# Patient Record
Sex: Male | Born: 1970 | Race: White | Hispanic: No | Smoking: Current every day smoker
Health system: Southern US, Community
[De-identification: ages and names within clinical notes are randomized; demographics above are authoritative.]

## PROBLEM LIST (undated history)

## (undated) DIAGNOSIS — F319 Bipolar disorder, unspecified: Secondary | ICD-10-CM

## (undated) DIAGNOSIS — I2510 Atherosclerotic heart disease of native coronary artery without angina pectoris: Secondary | ICD-10-CM

## (undated) DIAGNOSIS — Z951 Presence of aortocoronary bypass graft: Secondary | ICD-10-CM

## (undated) DIAGNOSIS — Z720 Tobacco use: Secondary | ICD-10-CM

## (undated) DIAGNOSIS — I10 Essential (primary) hypertension: Secondary | ICD-10-CM

## (undated) DIAGNOSIS — I219 Acute myocardial infarction, unspecified: Secondary | ICD-10-CM

## (undated) DIAGNOSIS — F1010 Alcohol abuse, uncomplicated: Secondary | ICD-10-CM

## (undated) HISTORY — PX: CORONARY ARTERY BYPASS GRAFT: SHX141

## (undated) HISTORY — PX: CARDIAC SURGERY: SHX584

---

## 2004-11-10 ENCOUNTER — Other Ambulatory Visit: Payer: Self-pay

## 2004-11-10 ENCOUNTER — Inpatient Hospital Stay: Payer: Self-pay | Admitting: Internal Medicine

## 2004-11-11 ENCOUNTER — Other Ambulatory Visit: Payer: Self-pay

## 2008-05-23 ENCOUNTER — Emergency Department: Payer: Self-pay | Admitting: Emergency Medicine

## 2013-05-24 ENCOUNTER — Emergency Department: Payer: Self-pay | Admitting: Emergency Medicine

## 2014-01-07 ENCOUNTER — Emergency Department: Payer: Self-pay | Admitting: Emergency Medicine

## 2014-01-07 LAB — COMPREHENSIVE METABOLIC PANEL
ALBUMIN: 4.6 g/dL (ref 3.4–5.0)
Alkaline Phosphatase: 88 U/L
Anion Gap: 6 — ABNORMAL LOW (ref 7–16)
BILIRUBIN TOTAL: 0.2 mg/dL (ref 0.2–1.0)
BUN: 5 mg/dL — AB (ref 7–18)
CO2: 28 mmol/L (ref 21–32)
Calcium, Total: 9 mg/dL (ref 8.5–10.1)
Chloride: 107 mmol/L (ref 98–107)
Creatinine: 0.56 mg/dL — ABNORMAL LOW (ref 0.60–1.30)
EGFR (African American): >60
EGFR (Non-African Amer.): >60
Glucose: 95 mg/dL (ref 65–99)
OSMOLALITY: 278 (ref 275–301)
Potassium: 3.8 mmol/L (ref 3.5–5.1)
SGOT(AST): 110 U/L — ABNORMAL HIGH (ref 15–37)
SGPT (ALT): 75 U/L (ref 12–78)
SODIUM: 141 mmol/L (ref 136–145)
Total Protein: 8.5 g/dL — ABNORMAL HIGH (ref 6.4–8.2)

## 2014-01-07 LAB — CBC
HCT: 46.3 % (ref 40.0–52.0)
HGB: 15.8 g/dL (ref 13.0–18.0)
MCH: 34 pg (ref 26.0–34.0)
MCHC: 34.1 g/dL (ref 32.0–36.0)
MCV: 100 fL (ref 80–100)
Platelet: 159 10*3/uL (ref 150–440)
RBC: 4.65 10*6/uL (ref 4.40–5.90)
RDW: 13.5 % (ref 11.5–14.5)
WBC: 7.5 10*3/uL (ref 3.8–10.6)

## 2014-01-07 LAB — ETHANOL
ETHANOL %: 0.382 % — AB (ref 0.000–0.080)
Ethanol: 382 mg/dL

## 2014-01-08 ENCOUNTER — Emergency Department: Payer: Self-pay | Admitting: Emergency Medicine

## 2014-01-08 LAB — COMPREHENSIVE METABOLIC PANEL
AST: 107 U/L — AB (ref 15–37)
Albumin: 4.4 g/dL (ref 3.4–5.0)
Alkaline Phosphatase: 89 U/L
Anion Gap: 9 (ref 7–16)
BILIRUBIN TOTAL: 0.5 mg/dL (ref 0.2–1.0)
BUN: 6 mg/dL — ABNORMAL LOW (ref 7–18)
CALCIUM: 9.3 mg/dL (ref 8.5–10.1)
Chloride: 102 mmol/L (ref 98–107)
Co2: 28 mmol/L (ref 21–32)
Creatinine: 0.76 mg/dL (ref 0.60–1.30)
EGFR (African American): >60
EGFR (Non-African Amer.): >60
GLUCOSE: 141 mg/dL — AB (ref 65–99)
OSMOLALITY: 278 (ref 275–301)
POTASSIUM: 3.5 mmol/L (ref 3.5–5.1)
SGPT (ALT): 75 U/L (ref 12–78)
Sodium: 139 mmol/L (ref 136–145)
Total Protein: 8.6 g/dL — ABNORMAL HIGH (ref 6.4–8.2)

## 2014-01-08 LAB — DRUG SCREEN, URINE

## 2014-01-08 LAB — URINALYSIS, COMPLETE
BACTERIA: NONE SEEN
Bilirubin,UR: NEGATIVE
Blood: NEGATIVE
Glucose,UR: 50 mg/dL (ref 0–75)
Ketone: NEGATIVE
LEUKOCYTE ESTERASE: NEGATIVE
NITRITE: NEGATIVE
Ph: 6 (ref 4.5–8.0)
Protein: NEGATIVE
RBC,UR: NONE SEEN /HPF (ref 0–5)
SPECIFIC GRAVITY: 1.003 (ref 1.003–1.030)
SQUAMOUS EPITHELIAL: NONE SEEN

## 2014-01-08 LAB — SALICYLATE LEVEL: SALICYLATES, SERUM: 4.1 mg/dL — AB

## 2014-01-08 LAB — CBC
HCT: 45.1 % (ref 40.0–52.0)
HGB: 15.5 g/dL (ref 13.0–18.0)
MCH: 34.6 pg — ABNORMAL HIGH (ref 26.0–34.0)
MCHC: 34.4 g/dL (ref 32.0–36.0)
MCV: 101 fL — ABNORMAL HIGH (ref 80–100)
Platelet: 148 10*3/uL — ABNORMAL LOW (ref 150–440)
RBC: 4.49 10*6/uL (ref 4.40–5.90)
RDW: 13.7 % (ref 11.5–14.5)
WBC: 6.5 10*3/uL (ref 3.8–10.6)

## 2014-01-08 LAB — ETHANOL
ETHANOL LVL: 324 mg/dL — AB
Ethanol %: 0.324 % (ref 0.000–0.080)

## 2014-01-08 LAB — ACETAMINOPHEN LEVEL

## 2015-03-25 ENCOUNTER — Encounter: Payer: Self-pay | Admitting: Emergency Medicine

## 2015-03-25 ENCOUNTER — Emergency Department
Admission: EM | Admit: 2015-03-25 | Discharge: 2015-03-25 | Disposition: A | Discharge status: Home / Self Care | Payer: Self-pay | Attending: Student | Admitting: Student

## 2015-03-25 DIAGNOSIS — Y9289 Other specified places as the place of occurrence of the external cause: Secondary | ICD-10-CM | POA: Insufficient documentation

## 2015-03-25 DIAGNOSIS — Y9389 Activity, other specified: Secondary | ICD-10-CM | POA: Insufficient documentation

## 2015-03-25 DIAGNOSIS — R509 Fever, unspecified: Secondary | ICD-10-CM | POA: Insufficient documentation

## 2015-03-25 DIAGNOSIS — Z720 Tobacco use: Secondary | ICD-10-CM | POA: Insufficient documentation

## 2015-03-25 DIAGNOSIS — I10 Essential (primary) hypertension: Secondary | ICD-10-CM | POA: Insufficient documentation

## 2015-03-25 DIAGNOSIS — W57XXXA Bitten or stung by nonvenomous insect and other nonvenomous arthropods, initial encounter: Secondary | ICD-10-CM | POA: Insufficient documentation

## 2015-03-25 DIAGNOSIS — T148 Other injury of unspecified body region: Secondary | ICD-10-CM | POA: Insufficient documentation

## 2015-03-25 DIAGNOSIS — Y998 Other external cause status: Secondary | ICD-10-CM | POA: Insufficient documentation

## 2015-03-25 HISTORY — DX: Presence of aortocoronary bypass graft: Z95.1

## 2015-03-25 HISTORY — DX: Essential (primary) hypertension: I10

## 2015-03-25 MED ORDER — DOXYCYCLINE HYCLATE 100 MG PO TABS
100.0000 mg | ORAL_TABLET | Freq: Once | ORAL | Status: DC
  Administered 2015-03-25: 100 mg via ORAL
  Filled 2015-03-25: qty 1

## 2015-03-25 MED ORDER — DOXYCYCLINE HYCLATE 100 MG PO TABS: 100.0000 mg | ORAL_TABLET | Freq: Two times a day (BID) | ORAL | Status: DC

## 2015-03-25 NOTE — Discharge Instructions (Signed)
Tick Bite Information Ticks are insects that attach themselves to the skin and draw blood for food. There are various types of ticks. Common types include wood ticks and deer ticks. Most ticks live in shrubs and grassy areas. Ticks can climb onto your body when you make contact with leaves or grass where the tick is waiting. The most common places on the body for ticks to attach themselves are the scalp, neck, armpits, waist, and groin. Most tick bites are harmless, but sometimes ticks carry germs that cause diseases. These germs can be spread to a person during the tick's feeding process. The chance of a disease spreading through a tick bite depends on:   The type of tick.  Time of year.   How long the tick is attached.   Geographic location.  HOW CAN YOU PREVENT TICK BITES? Take these steps to help prevent tick bites when you are outdoors:  Wear protective clothing. Long sleeves and long pants are best.   Wear white clothes so you can see ticks more easily.  Tuck your pant legs into your socks.   If walking on a trail, stay in the middle of the trail to avoid brushing against bushes.  Avoid walking through areas with long grass.  Put insect repellent on all exposed skin and along boot tops, pant legs, and sleeve cuffs.   Check clothing, hair, and skin repeatedly and before going inside.   Brush off any ticks that are not attached.  Take a shower or bath as soon as possible after being outdoors.  WHAT IS THE PROPER WAY TO REMOVE A TICK? Ticks should be removed as soon as possible to help prevent diseases caused by tick bites. 1. If latex gloves are available, put them on before trying to remove a tick.  2. Using fine-point tweezers, grasp the tick as close to the skin as possible. You may also use curved forceps or a tick removal tool. Grasp the tick as close to its head as possible. Avoid grasping the tick on its body. 3. Pull gently with steady upward pressure until  the tick lets go. Do not twist the tick or jerk it suddenly. This may break off the tick's head or mouth parts. 4. Do not squeeze or crush the tick's body. This could force disease-carrying fluids from the tick into your body.  5. After the tick is removed, wash the bite area and your hands with soap and water or other disinfectant such as alcohol. 6. Apply a small amount of antiseptic cream or ointment to the bite site.  7. Wash and disinfect any instruments that were used.  Do not try to remove a tick by applying a hot match, petroleum jelly, or fingernail polish to the tick. These methods do not work and may increase the chances of disease being spread from the tick bite.  WHEN SHOULD YOU SEEK MEDICAL CARE? Contact your health care provider if you are unable to remove a tick from your skin or if a part of the tick breaks off and is stuck in the skin.  After a tick bite, you need to be aware of signs and symptoms that could be related to diseases spread by ticks. Contact your health care provider if you develop any of the following in the days or weeks after the tick bite:  Unexplained fever.  Rash. A circular rash that appears days or weeks after the tick bite may indicate the possibility of Lyme disease. The rash may resemble   a target with a bull's-eye and may occur at a different part of your body than the tick bite.  Redness and swelling in the area of the tick bite.   Tender, swollen lymph glands.   Diarrhea.   Weight loss.   Cough.   Fatigue.   Muscle, joint, or bone pain.   Abdominal pain.   Headache.   Lethargy or a change in your level of consciousness.  Difficulty walking or moving your legs.   Numbness in the legs.   Paralysis.  Shortness of breath.   Confusion.   Repeated vomiting.  Document Released: 11/03/2000 Document Revised: 08/27/2013 Document Reviewed: 04/16/2013 [employer] Patient Information 2015 [employer]. This information is  not intended to replace advice given to you by your health care provider. Make sure you discuss any questions you have with your health care provider.  

## 2015-03-25 NOTE — ED Provider Notes (Signed)
[facility] Emergency Department Provider Note ____________________________________________  Time seen: ----------------------------------------- 3:27 PM on 03/25/2015 -----------------------------------------    I have reviewed the triage vital signs and the nursing notes.   HISTORY  Chief Complaint Tick bite/fever   HPI Jason Atkins is a 44 y.o. male presents with one week hx of tick bites. Initially just had itching at sites but now is feeling feverish, with myalgias.  No cough, congestion, sore throat, abd pain, nausea, diarrhea.  No ring like rash at the sites.  Past Medical History  Diagnosis Date  . Hx of CABG   . Hypertension     There are no active problems to display for this patient.   No past surgical history on file.  Current Outpatient Rx  Name  Route  Sig  Dispense  Refill  . doxycycline (VIBRA-TABS) 100 MG tablet   Oral   Take 1 tablet (100 mg total) by mouth 2 (two) times daily.   20 tablet   1     Allergies Review of patient's allergies indicates no known allergies.  History reviewed. No pertinent family history.  Social History History  Substance Use Topics  . Smoking status: Current Every Day Smoker -- 0.50 packs/day  . Smokeless tobacco: Not on file  . Alcohol Use: Yes    Review of Systems  Constitutional:  fever. Eyes: Negative for visual changes. ENT: Negative for sore throat. Cardiovascular: Negative for chest pain. Respiratory: Negative for shortness of breath. Gastrointestinal: Negative for abdominal pain, vomiting and diarrhea. Genitourinary: Negative for dysuria. Musculoskeletal: Negative for back pain. Skin: Negative for rash. Neurological: Negative for headaches, focal weakness or numbness.   10-point ROS otherwise negative.  ____________________________________________   PHYSICAL EXAM:  VITAL SIGNS: ED Triage Vitals  Enc Vitals Group     BP 03/25/15 1446 147/109 mmHg     Pulse Rate  03/25/15 1446 98     Resp 03/25/15 1446 18     Temp 03/25/15 1446 97.4 F (36.3 C)     Temp Source 03/25/15 1446 Oral     SpO2 03/25/15 1446 97 %     Weight 03/25/15 1446 178 lb (80.74 kg)     Height 03/25/15 1446 5\' 11"  (1.803 m)     Head Cir --      Peak Flow --      Pain Score 03/25/15 1447 4     Pain Loc --      Pain Edu? --      Excl. in GC? --     Constitutional: Alert and oriented. Well appearing and in no distress. Eyes: Conjunctivae are normal. PERRL. Normal extraocular movements. ENT   Head: Normocephalic and atraumatic.   Nose: No congestion/rhinnorhea.   Mouth/Throat: Mucous membranes are moist.   Neck: No stridor. Hematological/Lymphatic/Immunilogical: No cervical lymphadenopathy. Cardiovascular: Normal rate, regular rhythm. Normal and symmetric distal pulses are present in all extremities. No murmurs, rubs, or gallops. Respiratory: Normal respiratory effort without tachypnea nor retractions. Breath sounds are clear and equal bilaterally. No wheezes/rales/rhonchi. Gastrointestinal: Soft and nontender. No distention. No abdominal bruits. There is no CVA tenderness. Genitourinary:  Musculoskeletal: Nontender with normal range of motion in all extremities. No joint effusions.  No lower extremity tenderness nor edema. Neurologic:  Normal speech and language. No gross focal neurologic deficits are appreciated. Speech is normal. No gait instability. Skin:  Skin is warm, dry and intact. Erythematous area on right and left hips/waist line with central ecchymotic area 2-3 mm.  No remaining tick  identified. Psychiatric: Mood and affect are normal. Speech and behavior are normal. Patient exhibits appropriate insight and judgment.  ____________________________________________    LABS (pertinent positives/negatives)    ____________________________________________   EKG    ____________________________________________     RADIOLOGY    ____________________________________________   PROCEDURES  Procedure(s) performed: None  Critical Care performed: No  ____________________________________________   INITIAL IMPRESSION / ASSESSMENT AND PLAN / ED COURSE  Tick bite  Fever, unspecified fever cause  Given doxycycline bid x 10 days.  Follow up as needed for persistent symptoms.  Pertinent labs & imaging results that were available during my care of the patient were reviewed by me and considered in my medical decision making (see chart for details).  ____________________________________________   FINAL CLINICAL IMPRESSION(S) / ED DIAGNOSES  Final diagnoses:  Tick bite  Fever, unspecified fever cause     Jason BayleyRobert Sacha Radloff, PA-C 03/25/15 1613  Jason Bayleyobert Deyonte Cadden, PA-C 03/25/15 1755  Gayla DossEryka A Gayle, MD 03/26/15 [id number]

## 2015-03-25 NOTE — ED Notes (Signed)
Pt with multiple tick bites to lower abd about one week ago and now having nausea and states he feels lethargic.

## 2015-10-26 ENCOUNTER — Encounter: Payer: Self-pay | Admitting: Emergency Medicine

## 2015-10-26 ENCOUNTER — Emergency Department

## 2015-10-26 ENCOUNTER — Emergency Department
Admission: EM | Admit: 2015-10-26 | Discharge: 2015-10-26 | Disposition: A | Discharge status: Home / Self Care | Attending: Emergency Medicine | Admitting: Emergency Medicine

## 2015-10-26 DIAGNOSIS — I10 Essential (primary) hypertension: Secondary | ICD-10-CM | POA: Insufficient documentation

## 2015-10-26 DIAGNOSIS — F17210 Nicotine dependence, cigarettes, uncomplicated: Secondary | ICD-10-CM | POA: Diagnosis not present

## 2015-10-26 DIAGNOSIS — Y9289 Other specified places as the place of occurrence of the external cause: Secondary | ICD-10-CM | POA: Diagnosis not present

## 2015-10-26 DIAGNOSIS — Z79899 Other long term (current) drug therapy: Secondary | ICD-10-CM | POA: Insufficient documentation

## 2015-10-26 DIAGNOSIS — S90512A Abrasion, left ankle, initial encounter: Secondary | ICD-10-CM | POA: Insufficient documentation

## 2015-10-26 DIAGNOSIS — Y998 Other external cause status: Secondary | ICD-10-CM | POA: Diagnosis not present

## 2015-10-26 DIAGNOSIS — Y9389 Activity, other specified: Secondary | ICD-10-CM | POA: Diagnosis not present

## 2015-10-26 DIAGNOSIS — M79672 Pain in left foot: Secondary | ICD-10-CM | POA: Diagnosis present

## 2015-10-26 DIAGNOSIS — F419 Anxiety disorder, unspecified: Secondary | ICD-10-CM | POA: Diagnosis not present

## 2015-10-26 DIAGNOSIS — L03116 Cellulitis of left lower limb: Secondary | ICD-10-CM | POA: Diagnosis not present

## 2015-10-26 DIAGNOSIS — X58XXXA Exposure to other specified factors, initial encounter: Secondary | ICD-10-CM | POA: Insufficient documentation

## 2015-10-26 DIAGNOSIS — L03818 Cellulitis of other sites: Secondary | ICD-10-CM

## 2015-10-26 LAB — URINALYSIS COMPLETE WITH MICROSCOPIC (ARMC ONLY)
BILIRUBIN URINE: NEGATIVE
Bacteria, UA: NONE SEEN
Glucose, UA: NEGATIVE mg/dL
Hgb urine dipstick: NEGATIVE
Leukocytes, UA: NEGATIVE
NITRITE: NEGATIVE
PH: 6 (ref 5.0–8.0)
PROTEIN: 30 mg/dL — AB
SPECIFIC GRAVITY, URINE: 1.032 — AB (ref 1.005–1.030)

## 2015-10-26 LAB — CBC
HCT: 39.3 % — ABNORMAL LOW (ref 40.0–52.0)
Hemoglobin: 12.9 g/dL — ABNORMAL LOW (ref 13.0–18.0)
MCH: 32.7 pg (ref 26.0–34.0)
MCHC: 32.9 g/dL (ref 32.0–36.0)
MCV: 99.4 fL (ref 80.0–100.0)
Platelets: 216 10*3/uL (ref 150–440)
RBC: 3.96 MIL/uL — AB (ref 4.40–5.90)
RDW: 12.9 % (ref 11.5–14.5)
WBC: 5.2 10*3/uL (ref 3.8–10.6)

## 2015-10-26 LAB — COMPREHENSIVE METABOLIC PANEL
ALT: 52 U/L (ref 17–63)
AST: 91 U/L — ABNORMAL HIGH (ref 15–41)
Albumin: 4.5 g/dL (ref 3.5–5.0)
Alkaline Phosphatase: 64 U/L (ref 38–126)
Anion gap: 10 (ref 5–15)
BILIRUBIN TOTAL: 1.6 mg/dL — AB (ref 0.3–1.2)
BUN: 18 mg/dL (ref 6–20)
CHLORIDE: 97 mmol/L — AB (ref 101–111)
CO2: 30 mmol/L (ref 22–32)
Calcium: 9.3 mg/dL (ref 8.9–10.3)
Creatinine, Ser: 0.71 mg/dL (ref 0.61–1.24)
GFR calc Af Amer: >60 mL/min (ref >60)
Glucose, Bld: 84 mg/dL (ref 65–99)
POTASSIUM: 3.4 mmol/L — AB (ref 3.5–5.1)
Sodium: 137 mmol/L (ref 135–145)
TOTAL PROTEIN: 8.4 g/dL — AB (ref 6.5–8.1)

## 2015-10-26 LAB — URINE DRUG SCREEN, QUALITATIVE (ARMC ONLY)
AMPHETAMINES, UR SCREEN: NOT DETECTED
BENZODIAZEPINE, UR SCRN: POSITIVE — AB
Barbiturates, Ur Screen: NOT DETECTED
Cannabinoid 50 Ng, Ur ~~LOC~~: NOT DETECTED
Cocaine Metabolite,Ur ~~LOC~~: NOT DETECTED
MDMA (ECSTASY) UR SCREEN: NOT DETECTED
Methadone Scn, Ur: NOT DETECTED
Opiate, Ur Screen: NOT DETECTED
PHENCYCLIDINE (PCP) UR S: NOT DETECTED
TRICYCLIC, UR SCREEN: NOT DETECTED

## 2015-10-26 LAB — GLUCOSE, CAPILLARY: GLUCOSE-CAPILLARY: 85 mg/dL (ref 65–99)

## 2015-10-26 LAB — LITHIUM LEVEL: Lithium Lvl: <0.06 mmol/L — ABNORMAL LOW (ref 0.60–1.20)

## 2015-10-26 LAB — TROPONIN I: TROPONIN I: 0.04 ng/mL — AB (ref <0.031)

## 2015-10-26 LAB — PHENYTOIN LEVEL, TOTAL: Phenytoin Lvl: <2.5 ug/mL — ABNORMAL LOW (ref 10.0–20.0)

## 2015-10-26 MED ORDER — SODIUM CHLORIDE 0.9 % IV SOLN
1000.0000 mL | Freq: Once | INTRAVENOUS | Status: AC
  Administered 2015-10-26: 1000 mL via INTRAVENOUS

## 2015-10-26 MED ORDER — CEPHALEXIN 500 MG PO CAPS: 500.0000 mg | ORAL_CAPSULE | Freq: Two times a day (BID) | ORAL | Status: DC

## 2015-10-26 MED ORDER — SULFAMETHOXAZOLE-TRIMETHOPRIM 800-160 MG PO TABS: 1.0000 | ORAL_TABLET | Freq: Two times a day (BID) | ORAL | Status: DC
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
## 2015-10-26 NOTE — ED Notes (Signed)
Troponin 0.04 Critical Result, Dr.Kinner Notified
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
## 2015-10-26 NOTE — Discharge Instructions (Signed)

## 2016-02-10 ENCOUNTER — Emergency Department: Payer: Self-pay

## 2016-02-10 ENCOUNTER — Encounter: Payer: Self-pay | Admitting: Emergency Medicine

## 2016-02-10 ENCOUNTER — Emergency Department
Admission: EM | Admit: 2016-02-10 | Discharge: 2016-02-10 | Disposition: A | Discharge status: Home / Self Care | Payer: Self-pay | Attending: Emergency Medicine | Admitting: Emergency Medicine

## 2016-02-10 DIAGNOSIS — F17210 Nicotine dependence, cigarettes, uncomplicated: Secondary | ICD-10-CM | POA: Insufficient documentation

## 2016-02-10 DIAGNOSIS — Z792 Long term (current) use of antibiotics: Secondary | ICD-10-CM | POA: Insufficient documentation

## 2016-02-10 DIAGNOSIS — Z951 Presence of aortocoronary bypass graft: Secondary | ICD-10-CM | POA: Insufficient documentation

## 2016-02-10 DIAGNOSIS — R55 Syncope and collapse: Secondary | ICD-10-CM | POA: Insufficient documentation

## 2016-02-10 DIAGNOSIS — I10 Essential (primary) hypertension: Secondary | ICD-10-CM | POA: Insufficient documentation

## 2016-02-10 LAB — TROPONIN I

## 2016-02-10 LAB — COMPREHENSIVE METABOLIC PANEL
ALBUMIN: 5 g/dL (ref 3.5–5.0)
ALT: 44 U/L (ref 17–63)
ANION GAP: 9 (ref 5–15)
AST: 78 U/L — ABNORMAL HIGH (ref 15–41)
Alkaline Phosphatase: 71 U/L (ref 38–126)
BILIRUBIN TOTAL: 0.9 mg/dL (ref 0.3–1.2)
BUN: 7 mg/dL (ref 6–20)
CO2: 28 mmol/L (ref 22–32)
Calcium: 9 mg/dL (ref 8.9–10.3)
Chloride: 96 mmol/L — ABNORMAL LOW (ref 101–111)
Creatinine, Ser: 0.58 mg/dL — ABNORMAL LOW (ref 0.61–1.24)
GFR calc non Af Amer: >60 mL/min (ref >60)
Glucose, Bld: 91 mg/dL (ref 65–99)
POTASSIUM: 3.8 mmol/L (ref 3.5–5.1)
Sodium: 133 mmol/L — ABNORMAL LOW (ref 135–145)
Total Protein: 8.3 g/dL — ABNORMAL HIGH (ref 6.5–8.1)

## 2016-02-10 LAB — CBC WITH DIFFERENTIAL/PLATELET
BASOS ABS: 0.1 10*3/uL (ref 0–0.1)
BASOS PCT: 1 %
Eosinophils Absolute: 0.1 10*3/uL (ref 0–0.7)
Eosinophils Relative: 2 %
HEMATOCRIT: 44.7 % (ref 40.0–52.0)
Hemoglobin: 15.1 g/dL (ref 13.0–18.0)
LYMPHS PCT: 38 %
Lymphs Abs: 2.2 10*3/uL (ref 1.0–3.6)
MCH: 32.5 pg (ref 26.0–34.0)
MCHC: 33.9 g/dL (ref 32.0–36.0)
MCV: 96.1 fL (ref 80.0–100.0)
Monocytes Absolute: 0.5 10*3/uL (ref 0.2–1.0)
Monocytes Relative: 9 %
NEUTROS ABS: 2.9 10*3/uL (ref 1.4–6.5)
Neutrophils Relative %: 50 %
Platelets: 126 10*3/uL — ABNORMAL LOW (ref 150–440)
RBC: 4.65 MIL/uL (ref 4.40–5.90)
RDW: 13 % (ref 11.5–14.5)
WBC: 5.8 10*3/uL (ref 3.8–10.6)

## 2016-02-10 LAB — FIBRIN DERIVATIVES D-DIMER (ARMC ONLY): Fibrin derivatives D-dimer (ARMC): 469 (ref 0–499)

## 2016-02-10 MED ORDER — SODIUM CHLORIDE 0.9 % IV SOLN
1000.0000 mL | Freq: Once | INTRAVENOUS | Status: AC
  Administered 2016-02-10: 1000 mL via INTRAVENOUS

## 2016-02-10 NOTE — Discharge Instructions (Signed)

## 2016-02-10 NOTE — ED Notes (Addendum)
Reports having episodes of lightheadedness and "feels like he is going to pass out".  Reports generalized weakness and sinus congestion x1 wk.  States his left ear is "stopped up"

## 2016-02-10 NOTE — ED Provider Notes (Signed)
[facility] Emergency Department Provider Note  ____________________________________________    I have reviewed the triage vital signs and the nursing notes.   HISTORY  Chief Complaint Near Syncope    HPI Jason Atkins is a 45 y.o. male who presents after a near syncopal episode. Patient reports while at work he started feeling lightheaded and weak and like he needed to "lay down. He denies chest pain or palpitations. No fevers or chills. No recent travel. No calf pain or swelling. He reports this happened earlier in the week as well. He does report some mild sinus congestion but is not taking any medications. Positive history of CABG follows up with his cardiologist yearly.     Past Medical History  Diagnosis Date  . Hx of CABG   . Hypertension     There are no active problems to display for this patient.   Past Surgical History  Procedure Laterality Date  . Coronary artery bypass graft      Current Outpatient Rx  Name  Route  Sig  Dispense  Refill  . cephALEXin (KEFLEX) 500 MG capsule   Oral   Take 1 capsule (500 mg total) by mouth 2 (two) times daily.   14 capsule   0   . doxycycline (VIBRA-TABS) 100 MG tablet   Oral   Take 1 tablet (100 mg total) by mouth 2 (two) times daily.   20 tablet   1   . sulfamethoxazole-trimethoprim (BACTRIM DS,SEPTRA DS) 800-160 MG tablet   Oral   Take 1 tablet by mouth 2 (two) times daily.   14 tablet   0     Allergies Review of patient's allergies indicates no known allergies.  No family history on file.  Social History Social History  Substance Use Topics  . Smoking status: Current Every Day Smoker -- 0.50 packs/day    Types: Cigarettes  . Smokeless tobacco: None  . Alcohol Use: Yes    Review of Systems  Constitutional: Negative for fever. Eyes: Negative for redness ENT: Negative for sore throat. Positive for sinus congestion Cardiovascular: Negative for chest pain. No  palpitations Respiratory: Negative for shortness of breath. Gastrointestinal: Negative for abdominal pain Genitourinary: Negative for dysuria. Musculoskeletal: Negative for back pain. Skin: Negative for rash. Neurological: Negative for focal weakness Psychiatric: no anxiety    ____________________________________________   PHYSICAL EXAM:  VITAL SIGNS: ED Triage Vitals  Enc Vitals Group     BP 02/10/16 1152 141/95 mmHg     Pulse Rate 02/10/16 1152 82     Resp 02/10/16 1152 18     Temp 02/10/16 1152 97.7 F (36.5 C)     Temp Source 02/10/16 1152 Oral     SpO2 02/10/16 1152 100 %     Weight --      Height --      Head Cir --      Peak Flow --      Pain Score 02/10/16 1153 0     Pain Loc --      Pain Edu? --      Excl. in GC? --      Constitutional: Alert and oriented. Well appearing and in no distress.  Eyes: Conjunctivae are normal. No erythema or injection ENT   Head: Normocephalic and atraumatic.   Mouth/Throat: Mucous membranes are moist. Cardiovascular: Normal rate, regular rhythm. Normal and symmetric distal pulses are present in the upper extremities. No murmurs or rubs  Respiratory: Normal respiratory effort without tachypnea nor retractions.  Breath sounds are clear and equal bilaterally.  Gastrointestinal: Soft and non-tender in all quadrants. No distention. There is no CVA tenderness. Genitourinary: deferred Musculoskeletal: Nontender with normal range of motion in all extremities. No lower extremity tenderness nor edema. Neurologic:  Normal speech and language. No gross focal neurologic deficits are appreciated. Skin:  Skin is warm, dry and intact. No rash noted. Psychiatric: Mood and affect are normal. Patient exhibits appropriate insight and judgment.  ____________________________________________    LABS (pertinent positives/negatives)  Labs Reviewed  CBC WITH DIFFERENTIAL/PLATELET - Abnormal; Notable for the following:    Platelets 126 (*)     All other components within normal limits  COMPREHENSIVE METABOLIC PANEL - Abnormal; Notable for the following:    Sodium 133 (*)    Chloride 96 (*)    Creatinine, Ser 0.58 (*)    Total Protein 8.3 (*)    AST 78 (*)    All other components within normal limits  TROPONIN I    ____________________________________________   EKG  ED ECG REPORT I, Jene Every, the attending physician, personally viewed and interpreted this ECG.  Date: 02/10/2016 EKG Time: 11:58 AM Rate: 73 Rhythm: normal sinus rhythm QRS Axis: normal Intervals: normal ST/T Wave abnormalities: normal Conduction Disturbances: none Narrative Interpretation: unremarkable   ____________________________________________    RADIOLOGY chest x-ray unremarkable  ____________________________________________   PROCEDURES  Procedure(s) performed: none  Critical Care performed: none  ____________________________________________   INITIAL IMPRESSION / ASSESSMENT AND PLAN / ED COURSE  Pertinent labs & imaging results that were available during my care of the patient were reviewed by me and considered in my medical decision making (see chart for details).  Patient well-appearing and in no distress. Lab work is overall unremarkable. EKG is normal, troponin is normal. We will check x-ray of chest given history of CABG. We'll give IV fluids and check orthostatics afterwards.  D-dimer normal. Orthostatics are normal. Patient will follow up with his cardiologist as an outpatient. He knows to return if any change in symptoms  ____________________________________________   FINAL CLINICAL IMPRESSION(S) / ED DIAGNOSES  Final diagnoses:  Near syncope          Jene Every, MD 02/10/16 [id number]

## 2016-04-08 ENCOUNTER — Emergency Department
Admission: EM | Admit: 2016-04-08 | Discharge: 2016-04-09 | Disposition: A | Discharge status: Home / Self Care | Attending: Emergency Medicine | Admitting: Emergency Medicine

## 2016-04-08 ENCOUNTER — Emergency Department

## 2016-04-08 DIAGNOSIS — Z79899 Other long term (current) drug therapy: Secondary | ICD-10-CM | POA: Insufficient documentation

## 2016-04-08 DIAGNOSIS — Z7982 Long term (current) use of aspirin: Secondary | ICD-10-CM | POA: Insufficient documentation

## 2016-04-08 DIAGNOSIS — F10231 Alcohol dependence with withdrawal delirium: Secondary | ICD-10-CM | POA: Insufficient documentation

## 2016-04-08 DIAGNOSIS — F17210 Nicotine dependence, cigarettes, uncomplicated: Secondary | ICD-10-CM | POA: Insufficient documentation

## 2016-04-08 DIAGNOSIS — F909 Attention-deficit hyperactivity disorder, unspecified type: Secondary | ICD-10-CM | POA: Insufficient documentation

## 2016-04-08 DIAGNOSIS — I10 Essential (primary) hypertension: Secondary | ICD-10-CM | POA: Insufficient documentation

## 2016-04-08 DIAGNOSIS — F10931 Alcohol use, unspecified with withdrawal delirium: Secondary | ICD-10-CM

## 2016-04-08 LAB — COMPREHENSIVE METABOLIC PANEL
ALK PHOS: 64 U/L (ref 38–126)
ALT: 177 U/L — AB (ref 17–63)
AST: 208 U/L — AB (ref 15–41)
Albumin: 4.6 g/dL (ref 3.5–5.0)
Anion gap: 10 (ref 5–15)
BILIRUBIN TOTAL: 1.5 mg/dL — AB (ref 0.3–1.2)
BUN: 9 mg/dL (ref 6–20)
CALCIUM: 9.3 mg/dL (ref 8.9–10.3)
CO2: 24 mmol/L (ref 22–32)
CREATININE: 0.69 mg/dL (ref 0.61–1.24)
Chloride: 102 mmol/L (ref 101–111)
GFR calc Af Amer: >60 mL/min (ref >60)
GFR calc non Af Amer: >60 mL/min (ref >60)
GLUCOSE: 145 mg/dL — AB (ref 65–99)
POTASSIUM: 3.2 mmol/L — AB (ref 3.5–5.1)
Sodium: 136 mmol/L (ref 135–145)
TOTAL PROTEIN: 7.6 g/dL (ref 6.5–8.1)

## 2016-04-08 LAB — URINALYSIS COMPLETE WITH MICROSCOPIC (ARMC ONLY)
BILIRUBIN URINE: NEGATIVE
Bacteria, UA: NONE SEEN
Glucose, UA: NEGATIVE mg/dL
Hgb urine dipstick: NEGATIVE
KETONES UR: NEGATIVE mg/dL
Leukocytes, UA: NEGATIVE
Nitrite: NEGATIVE
Protein, ur: NEGATIVE mg/dL
Specific Gravity, Urine: 1.009 (ref 1.005–1.030)
pH: 5 (ref 5.0–8.0)

## 2016-04-08 LAB — ETHANOL: Alcohol, Ethyl (B): 79 mg/dL — ABNORMAL HIGH (ref <5)

## 2016-04-08 LAB — URINE DRUG SCREEN, QUALITATIVE (ARMC ONLY)
AMPHETAMINES, UR SCREEN: NOT DETECTED
BENZODIAZEPINE, UR SCRN: NOT DETECTED
Barbiturates, Ur Screen: NOT DETECTED
Cannabinoid 50 Ng, Ur ~~LOC~~: NOT DETECTED
Cocaine Metabolite,Ur ~~LOC~~: NOT DETECTED
MDMA (Ecstasy)Ur Screen: NOT DETECTED
Methadone Scn, Ur: NOT DETECTED
OPIATE, UR SCREEN: NOT DETECTED
Phencyclidine (PCP) Ur S: NOT DETECTED
Tricyclic, Ur Screen: NOT DETECTED

## 2016-04-08 LAB — CBC
HEMATOCRIT: 39.8 % — AB (ref 40.0–52.0)
Hemoglobin: 13.6 g/dL (ref 13.0–18.0)
MCH: 34.7 pg — ABNORMAL HIGH (ref 26.0–34.0)
MCHC: 34.2 g/dL (ref 32.0–36.0)
MCV: 101.3 fL — AB (ref 80.0–100.0)
Platelets: 126 10*3/uL — ABNORMAL LOW (ref 150–440)
RBC: 3.92 MIL/uL — ABNORMAL LOW (ref 4.40–5.90)
RDW: 14 % (ref 11.5–14.5)
WBC: 8.5 10*3/uL (ref 3.8–10.6)

## 2016-04-08 LAB — ACETAMINOPHEN LEVEL: Acetaminophen (Tylenol), Serum: <10 ug/mL — ABNORMAL LOW (ref 10–30)

## 2016-04-08 LAB — PROTIME-INR
INR: 1.08
PROTHROMBIN TIME: 14.2 s (ref 11.4–15.0)

## 2016-04-08 LAB — SALICYLATE LEVEL: Salicylate Lvl: <4 mg/dL (ref 2.8–30.0)

## 2016-04-08 LAB — APTT: APTT: 28 s (ref 24–36)

## 2016-04-08 MED ORDER — ADULT MULTIVITAMIN W/MINERALS CH
1.0000 | ORAL_TABLET | Freq: Every day | ORAL | Status: DC
  Administered 2016-04-09: 1 via ORAL
  Filled 2016-04-08: qty 1

## 2016-04-08 MED ORDER — FOLIC ACID 1 MG PO TABS
1.0000 mg | ORAL_TABLET | Freq: Every day | ORAL | Status: DC
  Administered 2016-04-09: 1 mg via ORAL
  Filled 2016-04-08: qty 1

## 2016-04-08 MED ORDER — THIAMINE HCL 100 MG/ML IJ SOLN: 100.0000 mg | Freq: Every day | INTRAMUSCULAR | Status: DC

## 2016-04-08 MED ORDER — LORAZEPAM 1 MG PO TABS
1.0000 mg | ORAL_TABLET | Freq: Once | ORAL | Status: DC
  Filled 2016-04-08: qty 1

## 2016-04-08 MED ORDER — MAGNESIUM SULFATE 2 GM/50ML IV SOLN
2.0000 g | Freq: Once | INTRAVENOUS | Status: AC
  Administered 2016-04-08: 2 g via INTRAVENOUS
  Filled 2016-04-08: qty 50

## 2016-04-08 MED ORDER — THIAMINE HCL 100 MG/ML IJ SOLN
Freq: Once | INTRAVENOUS | Status: AC
  Administered 2016-04-08: 21:00:00 via INTRAVENOUS
  Filled 2016-04-08: qty 1000

## 2016-04-08 MED ORDER — POTASSIUM CHLORIDE CRYS ER 20 MEQ PO TBCR
40.0000 meq | EXTENDED_RELEASE_TABLET | Freq: Once | ORAL | Status: AC
  Administered 2016-04-08: 40 meq via ORAL
  Filled 2016-04-08: qty 2

## 2016-04-08 MED ORDER — LORAZEPAM 2 MG/ML IJ SOLN: 1.0000 mg | Freq: Four times a day (QID) | INTRAMUSCULAR | Status: DC | PRN

## 2016-04-08 MED ORDER — VITAMIN B-1 100 MG PO TABS
100.0000 mg | ORAL_TABLET | Freq: Every day | ORAL | Status: DC
  Administered 2016-04-09: 100 mg via ORAL
  Filled 2016-04-08: qty 1

## 2016-04-08 MED ORDER — LORAZEPAM 1 MG PO TABS: 1.0000 mg | ORAL_TABLET | Freq: Four times a day (QID) | ORAL | Status: DC | PRN

## 2016-04-08 NOTE — ED Notes (Addendum)
Patient is calm, cooperative. Patient declined Ativan at this time. States he will take the Ativan wit the bag that is coming from pharmacy

## 2016-04-08 NOTE — ED Notes (Signed)
Marny Lowensteinonya Mctigue Wife [phone]

## 2016-04-08 NOTE — BH Assessment (Addendum)
Tele Assessment Note   Jason Atkins is an 45 y.o. married male who presents unaccompanied to [facility] ED due to recent confusion, hallucinations and alcohol withdrawal. Pt reports he "had two incidents of sleep walking" and that he has "seen things move and change shape." Pt reports he has a history of alcohol dependence and was drinking approximately fourteen beers daily. He was convicted of a DUI and requires to attend a 90-day residential treatment program in [place], [place]. Pt reports he attempted to detox himself from alcohol prior to entering the facility two days ago. Pt says he was hearing the voice of what he thought was his father and ran out of the building to see if his father was hurt. Staff told him if he left the building he could not return. Pt went home and says he drank two beers which relieved the hallucinations. Pt reports he had a similar episode of confusion one year ago when he was in jail and detoxing from alcohol. Pt denies current auditory or visual hallucinations. He denies current suicidal ideation or history of suicide attempts. He denies current homicidal ideation or history of violence. He denies abuse of substances other than alcohol; Pt's blood alcohol level is 79 and urine drug screen is negative.  Pt lives with his wife, daughter and grandchildren. He says he recently lost his job and is under financial stress. He denies any current mental health providers other than at his mandated treatment center (Pt cannot remember the name of the facility).  Pt is dressed in hospital scrubs, alert, oriented x4 with normal speech and normal motor behavior. Eye contact is good. Pt's mood is euthymic and affect is congruent with mood. Thought process is coherent and relevant. There is no indication Pt is currently responding to internal stimuli or experiencing delusional thought content. Pt was calm and cooperative throughout assessment. He says he is not sure whether he can return  to the treatment center and will need to contact his parole officer. Pt's wife told nursing staff Pt has a court date 04/10/16.   Diagnosis: Alcohol Use Disorder, Severe; Alcohol withdrawal, With perceptual disturbances  Past Medical History:  Past Medical History  Diagnosis Date  . Hx of CABG   . Hypertension     Past Surgical History  Procedure Laterality Date  . Coronary artery bypass graft      Family History: No family history on file.  Social History:  reports that he has been smoking Cigarettes.  He has been smoking about 0.50 packs per day. He does not have any smokeless tobacco history on file. He reports that he drinks alcohol. He reports that he does not use illicit drugs.  Additional Social History:  Alcohol / Drug Use Pain Medications: Denies abuse Prescriptions: Denies abuse Over the Counter: Denies abuse History of alcohol / drug use?: Yes Longest period of sobriety (when/how long): Pt unsure Negative Consequences of Use: Financial, Legal, Personal relationships, Work / School Withdrawal Symptoms: DTs, Tremors, Sweats Substance #1 Name of Substance 1: Alcohol 1 - Age of First Use: 19 1 - Amount (size/oz): Approximately 14 beers 1 - Frequency: Daily 1 - Duration: Ongoing for years 1 - Last Use / Amount: 04/08/16, two beers  CIWA: CIWA-Ar BP: 116/83 mmHg Pulse Rate: 86 COWS:    PATIENT STRENGTHS: (choose at least two) Ability for insight Average or above average intelligence Capable of independent living Communication skills General fund of knowledge Motivation for treatment/growth Physical Health Supportive family/friends  Allergies:  Allergies  Allergen Reactions  . Ibuprofen Hives    Home Medications:  (Not in a hospital admission)  OB/GYN Status:  No LMP for male patient.  General Assessment Data [facility] ED TTS Assessment: In system Is this a Tele or Face-to-Face Assessment?: Tele Assessment Is this an Initial  Assessment or a Re-assessment for this encounter?: Initial Assessment Marital status: Married [place]acliff[place]iden name: NA Is patient pregnant?: No Pregnancy Status: No Living Arrangements: Spouse/significant other, Children, Other relatives (Wife, daughter, grandchildren) Can pt return to current living arrangement?: Yes Admission Status: Voluntary Is patient capable of signing voluntary admission?: Yes Referral Source: Self/Family/Friend Insurance type: Self-pay     Crisis Care Plan Living Arrangements: Spouse/significant other, Children, Other relatives (Wife, daughter, grandchildren) Legal Guardian: Other: (None) Name of Psychiatrist: None Name of Therapist: None  Education Status Is patient currently in school?: No Current Grade: NA Highest grade of school patient has completed: Some college Name of school: NA Contact person: NA  Risk to self with the past 6 months Suicidal Ideation: No Has patient been a risk to self within the past 6 months prior to admission? : No Suicidal Intent: No Has patient had any suicidal intent within the past 6 months prior to admission? : No Is patient at risk for suicide?: No Suicidal Plan?: No Has patient had any suicidal plan within the past 6 months prior to admission? : No Access to Means: No What has been your use of drugs/alcohol within the last 12 months?: Pt is abusing alcohol Previous Attempts/Gestures: No How many times?: 0 Other Self Harm Risks: None Triggers for Past Attempts: None known Intentional Self Injurious Behavior: None Family Suicide History: No Recent stressful life event(s): Financial Problems, Job Loss, Legal Issues Persecutory voices/beliefs?: No Depression: Yes Depression Symptoms: Isolating, Guilt Substance abuse history and/or treatment for substance abuse?: Yes Suicide prevention information given to non-admitted patients: Not applicable  Risk to Others within the past 6 months Homicidal Ideation: No Does patient  have any lifetime risk of violence toward others beyond the six months prior to admission? : No Thoughts of Harm to Others: No Current Homicidal Intent: No Current Homicidal Plan: No Access to Homicidal Means: No Identified Victim: None History of harm to others?: No Assessment of Violence: None Noted Violent Behavior Description: Pt denies history of violence Does patient have access to weapons?: No Criminal Charges Pending?: No Does patient have a court date: Yes Court Date: 04/10/16 (Convicted of DUI) Is patient on probation?: No  Psychosis Hallucinations: Auditory, Visual (See assessment note) Delusions: None noted  Mental Status Report Appearance/Hygiene: Unremarkable Eye Contact: Good Motor Activity: Unremarkable Speech: Logical/coherent Level of Consciousness: Alert Mood: Euthymic Affect: Appropriate to circumstance Anxiety Level: None Thought Processes: Coherent, Relevant Judgement: Partial Orientation: Person, Place, Time, Situation Obsessive Compulsive Thoughts/Behaviors: None  Cognitive Functioning Concentration: Decreased Memory: Recent Intact, Remote Intact IQ: Average Insight: Fair Impulse Control: Fair Appetite: Fair Weight Loss: 0 Weight Gain: 0 Sleep: Decreased Total Hours of Sleep: 2 Vegetative Symptoms: Staying in bed  ADLScreening [facility] Assessment Services) Patient's cognitive ability adequate to safely complete daily activities?: Yes Patient able to express need for assistance with ADLs?: Yes Independently performs ADLs?: Yes (appropriate for developmental age)  Prior Inpatient Therapy Prior Inpatient Therapy: No Prior Therapy Dates: NA Prior Therapy Facilty/Provider(s): NA Reason for Treatment: NA  Prior Outpatient Therapy Prior Outpatient Therapy: No Prior Therapy Dates: NA Prior Therapy Facilty/Provider(s): NA Reason for Treatment: NA Does patient have an ACCT team?: No Does  patient have Intensive In-House Services?  : No Does  patient have [facility] services? : No Does patient have P4CC services?: No  ADL Screening (condition at time of admission) Patient's cognitive ability adequate to safely complete daily activities?: Yes Is the patient deaf or have difficulty hearing?: No Does the patient have difficulty seeing, even when wearing glasses/contacts?: No Does the patient have difficulty concentrating, remembering, or making decisions?: No Patient able to express need for assistance with ADLs?: Yes Does the patient have difficulty dressing or bathing?: No Independently performs ADLs?: Yes (appropriate for developmental age) Does the patient have difficulty walking or climbing stairs?: No Weakness of Legs: None Weakness of Arms/Hands: None  Home Assistive Devices/Equipment Home Assistive Devices/Equipment: None    Abuse/Neglect Assessment (Assessment to be complete while patient is alone) Physical Abuse: Denies Verbal Abuse: Denies Sexual Abuse: Denies Exploitation of patient/patient's resources: Denies Self-Neglect: Denies     Merchant navy officer (For Healthcare) Does patient have an advance directive?: No Would patient like information on creating an advanced directive?: No - patient declined information    Additional Information 1:1 In Past 12 Months?: No CIRT Risk: No Elopement Risk: No Does patient have medical clearance?: No     Disposition: Gave clinical report to Maryjean Morn, PA who said Pt's ammonia level needs to be checked, his potassium level is low and he recommends Pt be observed for withdrawal symptoms and discharged to parole officer when medically cleared to complete his required substance abuse treatment. Notified Dr. Fanny Bien who said Pt will be monitored and psychiatry will follow up in the morning.  Disposition Initial Assessment Completed for this Encounter: Yes Disposition of Patient: Other dispositions Other disposition(s): Other (Comment)   Pamalee Leyden, [facility],  [facility] Triage Specialist [phone]   Patsy Baltimore, Harlin Rain 04/08/2016 9:19 PM

## 2016-04-08 NOTE — ED Notes (Signed)
Patient had TB test placed on left forearm on Thursday. Area is raised, reddened, and approximately 2cm x1.5cm in size.

## 2016-04-08 NOTE — ED Notes (Signed)
Ed Tech gave Pt snack tray and drink. AS

## 2016-04-08 NOTE — ED Notes (Signed)
Per family and pt - pt has new onset of hearing voices, hallucinations (seeing "dead people") and confusion. Wandered off from the house last night and now has a court date on the 22nd. Pt was changed into scrubs in triage and labs obtained. Needs urine sample. Pt denies drinking or drug use. States started a new job on thurs and has a tb test on his left arm. Pt changed to medical d/t new onset of symptoms and moved to room 17.

## 2016-04-08 NOTE — ED Notes (Signed)
Patient is able to answer questions correctly, but did forget what day it is. Patient got up from stretcher and looked behind it and said that he could hear music, mostly drums and occasionally cymbals, intermittently coming from behind him.

## 2016-04-08 NOTE — ED Provider Notes (Signed)
[facility] Emergency Department Provider Note  ____________________________________________  Time seen: Approximately 6:38 PM  I have reviewed the triage vital signs and the nursing notes.   HISTORY  Chief Complaint Psychiatric Evaluation and Altered Mental Status   HPI Jason Atkins is a 45 y.o. male reports that he's been having some hearing of voices for the last 2 days. Also reports he was "seeing dead people."  He is reports his symptoms seem to be much worse yesterday, and that today he drank 2 beers and his symptoms got better. He tells me that he started detoxing on his own O week ago he went from drinking several beers daily down to 2 a day, developed shakes, nausea, and tremulousness. He had to be sobered and entered into detox program Thursday, but then began hearing voices telling him is father was in trouble so he left. He was kicked out of the program and since he stopped using alcohol he started hearing voices of a woman speaking to him softly.  Patient reports he's had the same symptoms one year ago where he was confused, hearing voices, and this all started while in jail and receiving detox.  No fevers or chills. No headache. No nausea or vomiting.  Reports he was feeling tremulousness and shaking until drinking alcohol today which helped his symptoms again. Denies having any hallucinations or hearing things today. Denies wanting to harm himself or anyone else.   Past Medical History  Diagnosis Date  . Hx of CABG   . Hypertension     There are no active problems to display for this patient.   Past Surgical History  Procedure Laterality Date  . Coronary artery bypass graft      Reports not taking any outpatient medicine at this time Was taking blood pressure medicine until couple days ago  Allergies Ibuprofen  No family history on file.  Social History Social History  Substance Use Topics  . Smoking status: Current Every Day  Smoker -- 0.50 packs/day    Types: Cigarettes  . Smokeless tobacco: Not on file  . Alcohol Use: Yes    Review of Systems Constitutional: No fever/chills Eyes: No visual changes. ENT: No sore throat. Cardiovascular: Denies chest pain. Respiratory: Denies shortness of breath. Gastrointestinal: No abdominal pain.  No nausea, no vomiting.  No diarrhea.  No constipation. Genitourinary: Negative for dysuria. Musculoskeletal: Negative for back pain. Skin: Negative for rash. Neurological: Negative for headaches, focal weakness or numbness.  See history of present illness regarding psychiatric symptoms  10-point ROS otherwise negative.  ____________________________________________   PHYSICAL EXAM:  VITAL SIGNS: ED Triage Vitals  Enc Vitals Group     BP 04/08/16 1703 120/70 mmHg     Pulse Rate 04/08/16 1703 109     Resp 04/08/16 1703 22     Temp 04/08/16 1703 97.8 F (36.6 C)     Temp Source 04/08/16 1703 Oral     SpO2 04/08/16 1703 97 %     Weight 04/08/16 1703 170 lb (77.111 kg)     Height 04/08/16 1703 5\' 11"  (1.803 m)     Head Cir --      Peak Flow --      Pain Score --      Pain Loc --      Pain Edu? --      Excl. in GC? --    Constitutional: Alert and orientedTo person place and time and situation. Well appearing and in no acute distress. Eyes:  Conjunctivae are normal. PERRL. EOMI. Head: Atraumatic. Nose: No congestion/rhinnorhea. Mouth/Throat: Mucous membranes are moist.  Oropharynx non-erythematous. Neck: No stridor.  No nuchal rigidity. No meningismus. Cardiovascular: Slightly tachycardic rate, regular rhythm. Grossly normal heart sounds.  Good peripheral circulation. Respiratory: Normal respiratory effort.  No retractions. Lungs CTAB. Gastrointestinal: Soft and nontender. No distention. No abdominal bruits. No CVA tenderness. Musculoskeletal: No lower extremity tenderness nor edema.  No joint effusions. Neurologic:  Normal speech and language. No gross focal  neurologic deficits are appreciated. Very minimal tremor in the upper extremities bilateral.  Skin:  Skin is warm, dry and intact. No rash noted. Psychiatric: Mood and affect are normal. Speech and behavior are normal. Patient denies any active hallucinations, hearing voices, or other concerns at this time. He reports his symptoms are quite better after having 2 beers just prior to arrival.  ____________________________________________   LABS (all labs ordered are listed, but only abnormal results are displayed)  Labs Reviewed  COMPREHENSIVE METABOLIC PANEL - Abnormal; Notable for the following:    Potassium 3.2 (*)    Glucose, Bld 145 (*)    AST 208 (*)    ALT 177 (*)    Total Bilirubin 1.5 (*)    All other components within normal limits  ETHANOL - Abnormal; Notable for the following:    Alcohol, Ethyl (B) 79 (*)    All other components within normal limits  ACETAMINOPHEN LEVEL - Abnormal; Notable for the following:    Acetaminophen (Tylenol), Serum <10 (*)    All other components within normal limits  CBC - Abnormal; Notable for the following:    RBC 3.92 (*)    HCT 39.8 (*)    MCV 101.3 (*)    MCH 34.7 (*)    Platelets 126 (*)    All other components within normal limits  URINALYSIS COMPLETEWITH MICROSCOPIC ([facility] ONLY) - Abnormal; Notable for the following:    Color, Urine YELLOW (*)    APPearance CLEAR (*)    Squamous Epithelial / LPF 0-5 (*)    All other components within normal limits  SALICYLATE LEVEL  URINE DRUG SCREEN, QUALITATIVE ([facility] ONLY)  PROTIME-INR  APTT   ____________________________________________  EKG   ____________________________________________  RADIOLOGY   CT Head Wo Contrast (Final result) Result time: 04/08/16 19:08:20   Final result by Rad Results In Interface (04/08/16 19:08:20)   Narrative:   CLINICAL DATA: New onset hallucinations.  EXAM: CT HEAD WITHOUT CONTRAST  TECHNIQUE: Contiguous axial images were obtained from the  base of the skull through the vertex without intravenous contrast.  COMPARISON: 05/24/2013 orbit CT.  FINDINGS: No evidence of parenchymal hemorrhage or extra-axial fluid collection. No mass lesion, mass effect, or midline shift.  No CT evidence of acute infarction. Intracranial atherosclerosis.  Cerebral volume is age appropriate. No ventriculomegaly.  Minimal opacification of the bilateral ethmoidal air cells. No fluid levels in the visualized paranasal sinuses. The mastoid air cells are unopacified. No evidence of calvarial fracture.  IMPRESSION: 1. No evidence of acute intracranial abnormality. 2. Minimal paranasal sinusitis, probably chronic. 3. Intracranial atherosclerosis, somewhat age advanced.   Electronically Signed By: Delbert Phenix M.D. On: 04/08/2016 19:08     DG Chest 2 View (Final result) Result time: 04/08/16 18:38:29   Final result by Rad Results In Interface (04/08/16 18:38:29)   Narrative:   CLINICAL DATA: 45 year old with acute onset of hallucinations. Prior CABG. Current history of hypertension. Current smoker.  EXAM: CHEST 2 VIEW  COMPARISON: Portable chest 02/10/2016. CT chest  11/10/2004.  FINDINGS: Sternotomy for CABG. Cardiac silhouette normal in size, unchanged. Thoracic aorta mildly atherosclerotic. Hilar and mediastinal contours otherwise unremarkable. Lungs hyperinflated though clear. Bronchovascular markings normal. Pulmonary vascularity normal. No visible pleural effusions. No pneumothorax. Visualized bony thorax intact.  IMPRESSION: Hyperinflation indicating COPD and/or asthma. No acute cardiopulmonary disease.   Electronically Signed By: Hulan Saashomas Lawrence M.D. On: 04/08/2016 18:38    ____________________________________________   PROCEDURES  Procedure(s) performed: None  Critical Care performed: No  ____________________________________________   INITIAL IMPRESSION / ASSESSMENT AND PLAN / ED  COURSE  Pertinent labs & imaging results that were available during my care of the patient were reviewed by me and considered in my medical decision making (see chart for details).  Patient presents for hearing voices, and reporting withdrawal from alcohol. The patient's hearing voices, auditory hallucinations, are certainly concerning however he reports his symptoms quite a bit better after having alcohol again today. He shows no evidence of severe withdrawal, and reports his symptoms have improved. I am concerned about his safety, and reports of auditory hallucinations and will rest place him under involuntary commitment pending appropriate psychiatric consultation.  I do not find evidence suggesting acute medical cause such as brain tumor, acute metabolic abnormality, or evidence suggesting need for lumbar puncture as he is well oriented denies headache, has no neck pain or stiffness, and his symptomatology seems to be consistent with that of hallucinations and slight delirium likely due to acute alcohol withdrawal for which she has been attempting to obtain detox in the last few days.  D/W Dr. Amado CoeGouru Of the hospitalist service and the patient would not be appropriate to meet inpatient criteria at this time.  We will keep the patient under withdrawal protocol, have consult to psychiatry for further recommendations on treatment, and disposition.   ____________________________________________   FINAL CLINICAL IMPRESSION(S) / ED DIAGNOSES  Final diagnoses:  Alcohol withdrawal delirium, acute, hyperactive (HCC)      Sharyn CreamerMark Quale, MD 04/08/16 2116

## 2016-04-08 NOTE — ED Notes (Signed)
SOC in place. Patient is medically cleared, still waiting for vitamin bag to be tubed up from pharmacy. Patient denies any discomfort at this time. Request made to charge nurse for sitter to perform q15 checks.

## 2016-04-08 NOTE — ED Notes (Signed)
Wife takes pt's belongings home

## 2016-04-08 NOTE — ED Notes (Addendum)
Pt states he was hearing dead people talk to him Friday. Pt also reports having what he thinks is dreams but is reality. Denies SI/HI Denies pain. Wife in room now and reports  he was walking all night and then found sitting on a porch at a strangers house when she got a phone call to come and get him.

## 2016-04-08 NOTE — ED Notes (Addendum)
Report given to Wells FargoSusan RN. Patient ambulated from room 17 to room 21 with a steady, careful gait.

## 2016-04-08 NOTE — ED Notes (Signed)
Pt brought over to room 21 with his fluids infusing. Ambulated to room with his nurse

## 2016-04-09 NOTE — ED Notes (Signed)
Patient calling probation officer. Maintained on all safety checks.

## 2016-04-09 NOTE — ED Notes (Signed)

## 2016-04-09 NOTE — Discharge Instructions (Signed)

## 2016-04-09 NOTE — ED Notes (Signed)
Pt cooperative and calm. Pt denies HI and AVH; pt currently denies any c/o pain or nausea. No concerns voiced. No distress noted. Maintained on 15 minute checks and observation by security camera for safety

## 2016-04-09 NOTE — ED Notes (Signed)
BEHAVIORAL HEALTH ROUNDING  Patient sleeping: No.  Patient alert and oriented: yes  Behavior appropriate: Yes. ; If no, describe:  Nutrition and fluids offered: Yes  Toileting and hygiene offered: Yes  Sitter present: not applicable, Q 15 min safety rounds and observation.  Law enforcement present: Yes ODS  

## 2016-04-09 NOTE — ED Notes (Signed)
BEHAVIORAL HEALTH ROUNDING Patient sleeping: Yes.   Patient alert and oriented: not applicable SLEEPING Behavior appropriate: Yes.  ; If no, describe: SLEEPING Nutrition and fluids offered: No SLEEPING Toileting and hygiene offered: NoSLEEPING Sitter present: not applicable, Q 15 min safety rounds and observation. Law enforcement present: Yes ODS 

## 2016-04-09 NOTE — ED Notes (Signed)
Patient awake, alert, and oriented. Cooperative with all nursing interventions.  Patient denies any s/s ETOH withdrawal. Currently not reporting any hallucinations. Will continue to monitor clinical status and maintain on all safety checks.

## 2016-04-09 NOTE — BH Assessment (Signed)
Discussed patient with ER MD (Dr. Williams) and patient is able to discharge home when medically cleared. Patient was giving referral information and instructions on how to follow up with Outpatient Treatment ([facility] and [facility]) and Mobile Crisis.  Patient denies SI/HI and AV/H. 

## 2016-04-09 NOTE — ED Notes (Signed)
Patient received meal tray. Patient is waiting for wife to pick him up from hospital. Maintained on 15 minute checks and observation by security camera for safety.

## 2016-04-09 NOTE — ED Notes (Signed)
Patient discharged ambulatory to home, accompanied by wife. He denies SI or HI. Discharge instructions reviewed with patient, he verbalizes understanding. Patient received all personal belongings and copy of DC plan.

## 2016-04-09 NOTE — ED Notes (Signed)
Report called to Butch, RN in ED [facility].  

## 2016-04-09 NOTE — ED Provider Notes (Signed)
Patient has been clear for discharge by psychiatry. Remains medically stable.  Jason FilbertJonathan E Tyquasia Pant, MD 04/09/16 [phone]

## 2016-04-09 NOTE — ED Notes (Signed)
Patient asleep in room. No noted distress or abnormal behavior. Will continue 15 minute checks and observation by security cameras for safety. 

## 2016-04-09 NOTE — ED Notes (Signed)
Alerted ED charge nurse that lab work needed to be obtained by ED staff for ammonia level.

## 2016-04-09 NOTE — ED Notes (Signed)

## 2016-04-09 NOTE — ED Notes (Signed)
Patient resting quietly in room. No noted distress or abnormal behaviors noted. Will continue 15 minute checks and observation by security camera for safety. 

## 2016-04-09 NOTE — ED Notes (Signed)
Disposition discussed with patient. Waiting for discharge paperwork from MD.

## 2016-04-09 NOTE — BH Assessment (Signed)
Writer spoke with the patient with to assess his current mental and emotional state.  Patient reports of having no SI/HI and AV/H.  He states he is in the ER due to his wife having concerns, of him withdrawing from alcohol.  He was admitted to [facility] an only stayed for two days. He states he was having AV/H of his father being outside of the facility. He hasn't had anything happen like this, until last week, when he stop drinking. Longest he's been sober is up to 4 days.  He is currently on probation and it was his officer who arrange for [facility]. He voiced his understanding that he may have to work with his probation officer again, to get admitted into treatment facility to meet the requirements of the court.

## 2016-04-09 NOTE — ED Provider Notes (Signed)
-----------------------------------------   6:55 AM on 04/09/2016 -----------------------------------------   Blood pressure 139/84, pulse 80, temperature 98.1 F (36.7 C), temperature source Oral, resp. rate 16, height 5\' 11"  (1.803 m), weight 77.111 kg, SpO2 99 %.  The patient had no acute events since last update.  Calm and cooperative at this time.  Disposition is pending per Psychiatry/Behavioral Medicine team recommendations; TTS originally recommended discharge by Dr. Fanny BienQuale wants the patient evaluated by psychiatry personally.  I did not see that an ammonia level has been ordered as per TTS recommendations, so I put in that order this morning.  It needs to be followed up.     Loleta Roseory Damiyah Ditmars, MD 04/09/16 [phone]

## 2016-11-16 ENCOUNTER — Encounter: Payer: Self-pay | Admitting: Emergency Medicine

## 2016-11-16 ENCOUNTER — Emergency Department
Admission: EM | Admit: 2016-11-16 | Discharge: 2016-11-16 | Discharge status: Against Medical Advice | Payer: Self-pay | Attending: Emergency Medicine | Admitting: Emergency Medicine

## 2016-11-16 ENCOUNTER — Emergency Department: Payer: Self-pay

## 2016-11-16 DIAGNOSIS — F17210 Nicotine dependence, cigarettes, uncomplicated: Secondary | ICD-10-CM | POA: Insufficient documentation

## 2016-11-16 DIAGNOSIS — Z79899 Other long term (current) drug therapy: Secondary | ICD-10-CM | POA: Insufficient documentation

## 2016-11-16 DIAGNOSIS — I10 Essential (primary) hypertension: Secondary | ICD-10-CM | POA: Insufficient documentation

## 2016-11-16 DIAGNOSIS — I214 Non-ST elevation (NSTEMI) myocardial infarction: Secondary | ICD-10-CM | POA: Insufficient documentation

## 2016-11-16 DIAGNOSIS — Z7982 Long term (current) use of aspirin: Secondary | ICD-10-CM | POA: Insufficient documentation

## 2016-11-16 DIAGNOSIS — Z951 Presence of aortocoronary bypass graft: Secondary | ICD-10-CM | POA: Insufficient documentation

## 2016-11-16 DIAGNOSIS — Z9114 Patient's other noncompliance with medication regimen: Secondary | ICD-10-CM | POA: Insufficient documentation

## 2016-11-16 LAB — TROPONIN I: Troponin I: 2.89 ng/mL (ref <0.03)

## 2016-11-16 LAB — CBC
HCT: 48.3 % (ref 40.0–52.0)
Hemoglobin: 16.8 g/dL (ref 13.0–18.0)
MCH: 33.5 pg (ref 26.0–34.0)
MCHC: 34.7 g/dL (ref 32.0–36.0)
MCV: 96.5 fL (ref 80.0–100.0)
PLATELETS: 186 10*3/uL (ref 150–440)
RBC: 5.01 MIL/uL (ref 4.40–5.90)
RDW: 14.1 % (ref 11.5–14.5)
WBC: 8.6 10*3/uL (ref 3.8–10.6)

## 2016-11-16 LAB — BASIC METABOLIC PANEL
Anion gap: 11 (ref 5–15)
BUN: 5 mg/dL — AB (ref 6–20)
CHLORIDE: 97 mmol/L — AB (ref 101–111)
CO2: 28 mmol/L (ref 22–32)
CREATININE: 0.55 mg/dL — AB (ref 0.61–1.24)
Calcium: 9.7 mg/dL (ref 8.9–10.3)
GFR calc Af Amer: >60 mL/min (ref >60)
Glucose, Bld: 117 mg/dL — ABNORMAL HIGH (ref 65–99)
Potassium: 3.4 mmol/L — ABNORMAL LOW (ref 3.5–5.1)
SODIUM: 136 mmol/L (ref 135–145)

## 2016-11-16 MED ORDER — NITROGLYCERIN 2 % TD OINT: 1.0000 [in_us] | TOPICAL_OINTMENT | Freq: Once | TRANSDERMAL | Status: DC

## 2016-11-16 MED ORDER — HEPARIN (PORCINE) IN NACL 100-0.45 UNIT/ML-% IJ SOLN
1100.0000 [IU]/h | INTRAMUSCULAR | Status: DC
  Filled 2016-11-16: qty 250

## 2016-11-16 MED ORDER — ASPIRIN 81 MG PO CHEW
324.0000 mg | CHEWABLE_TABLET | Freq: Once | ORAL | Status: AC
  Administered 2016-11-16: 324 mg via ORAL
  Filled 2016-11-16: qty 4

## 2016-11-16 MED ORDER — NITROGLYCERIN 0.4 MG SL SUBL: 0.4000 mg | SUBLINGUAL_TABLET | SUBLINGUAL | 3 refills | Status: DC | PRN

## 2016-11-16 MED ORDER — ASPIRIN EC 325 MG PO TBEC: 325.0000 mg | DELAYED_RELEASE_TABLET | Freq: Every day | ORAL | 3 refills | Status: AC

## 2016-11-16 MED ORDER — HEPARIN SODIUM (PORCINE) 5000 UNIT/ML IJ SOLN: 4000.0000 [IU] | Freq: Once | INTRAMUSCULAR | Status: DC

## 2016-11-16 NOTE — ED Triage Notes (Signed)
Pt presents with chest pain started yesterday. Pt with hx of CABG.

## 2016-11-16 NOTE — ED Provider Notes (Addendum)
[facility] Emergency Department Provider Note        Time seen: ----------------------------------------- 7:10 PM on 11/16/2016 -----------------------------------------    I have reviewed the triage vital signs and the nursing notes.   HISTORY  Chief Complaint Chest Pain    HPI Jason Atkins is a 45 y.o. male who presents to ER for chest pain that started yesterday. Patient states today his wife states he looked gray. He does have history of hypertension with her neck bypass grafting and stents. His also had 2 heart attacks in the past in his chest started hurting him he was worried he is having another heart attack. The chest pain since 11 PM last night, states he went off his blood pressure medications. Patient's previous [facility] and cardiac [facility]. Currently he is chest pain-free and has no complaints.   Past Medical History:  Diagnosis Date  . Hx of CABG   . Hypertension     There are no active problems to display for this patient.   Past Surgical History:  Procedure Laterality Date  . CORONARY ARTERY BYPASS GRAFT      Allergies Ibuprofen  Social History Social History  Substance Use Topics  . Smoking status: Current Every Day Smoker    Packs/day: 0.50    Types: Cigarettes  . Smokeless tobacco: Never Used  . Alcohol use Yes    Review of Systems Constitutional: Negative for fever. Cardiovascular: Positive for chest pain Respiratory: Negative for shortness of breath. Gastrointestinal: Negative for abdominal pain, vomiting and diarrhea. Genitourinary: Negative for dysuria. Musculoskeletal: Negative for back pain. Skin: Negative for rash. Positive for sweats earlier Neurological: Negative for headaches, focal weakness or numbness.  10-point ROS otherwise negative.  ____________________________________________   PHYSICAL EXAM:  VITAL SIGNS: ED Triage Vitals  Enc Vitals Group     BP 11/16/16 1828 (!) 165/100      Pulse Rate 11/16/16 1828 91     Resp 11/16/16 1828 20     Temp 11/16/16 1828 97.9 F (36.6 C)     Temp Source 11/16/16 1828 Oral     SpO2 11/16/16 1828 99 %     Weight 11/16/16 1825 185 lb (83.9 kg)     Height 11/16/16 1825 6' (1.829 m)     Head Circumference --      Peak Flow --      Pain Score 11/16/16 1826 7     Pain Loc --      Pain Edu? --      Excl. in GC? --     Constitutional: Alert and oriented. Well appearing and in no distress. Eyes: Conjunctivae are normal. PERRL. Normal extraocular movements. ENT   Head: Normocephalic and atraumatic.   Nose: No congestion/rhinnorhea.   Mouth/Throat: Mucous membranes are moist.   Neck: No stridor. Cardiovascular: Normal rate, regular rhythm. No murmurs, rubs, or gallops. Respiratory: Normal respiratory effort without tachypnea nor retractions. Breath sounds are clear and equal bilaterally. No wheezes/rales/rhonchi. Gastrointestinal: Soft and nontender. Normal bowel sounds Musculoskeletal: Nontender with normal range of motion in all extremities. No lower extremity tenderness nor edema. Neurologic:  Normal speech and language. No gross focal neurologic deficits are appreciated.  Skin:  Skin is warm, dry and intact. No rash noted. Psychiatric: Mood and affect are normal. Speech and behavior are normal.  ____________________________________________  EKG: Interpreted by me. Sinus rhythm with a rate of 94 bpm, normal PR interval, normal QRS, normal QT, normal axis.  ____________________________________________  ED COURSE:  Pertinent labs & imaging results that were available during my care of the patient were reviewed by me and considered in my medical decision making (see chart for details). Clinical Course   Patient presents to the ER for chest pain he states was similar to his prior MI. He had been noncompliant with his medications. We will assess with basic labs and  imaging.  Procedures ____________________________________________   LABS (pertinent positives/negatives)  Labs Reviewed  BASIC METABOLIC PANEL - Abnormal; Notable for the following:       Result Value   Potassium 3.4 (*)    Chloride 97 (*)    Glucose, Bld 117 (*)    BUN 5 (*)    Creatinine, Ser 0.55 (*)    All other components within normal limits  TROPONIN I - Abnormal; Notable for the following:    Troponin I 2.89 (*)    All other components within normal limits  CBC   CRITICAL CARE Performed by: Emily FilbertWilliams, Maysun Meditz E   Total critical care time: 30 minutes  Critical care time was exclusive of separately billable procedures and treating other patients.  Critical care was necessary to treat or prevent imminent or life-threatening deterioration.  Critical care was time spent personally by me on the following activities: development of treatment plan with patient and/or surrogate as well as nursing, discussions with consultants, evaluation of patient's response to treatment, examination of patient, obtaining history from patient or surrogate, ordering and performing treatments and interventions, ordering and review of laboratory studies, ordering and review of radiographic studies, pulse oximetry and re-evaluation of patient's condition.  RADIOLOGY  Chest x-ray IMPRESSION: No active cardiopulmonary disease.   ____________________________________________  FINAL ASSESSMENT AND PLAN  Chest pain, non-ST elevation MI  Plan: Patient with labs and imaging as dictated above. Patient presented to the ER with chest pain like prior MI. He has been noncompliant on his medications and currently he is chest pain-free. Troponin was found to be markedly elevated. We had planned to give him aspirin, nitroglycerin and a heparin drip but he is refusing to stay and wants to leave AGAINST MEDICAL ADVICE. I have tried numerous times to talk him into admission but he is willing to accept the  risks. He did let us give him an aspirin prior to discharge.  Emily FilbertWilliams, Karrington Mccravy E, MD   Note: This dictation was prepared with Dragon dictation. Any transcriptional errors that result from this process are unintentional    Emily FilbertJonathan E Renton Berkley, MD 11/16/16 1913    Emily FilbertJonathan E Arwin Bisceglia, MD 11/16/16 [phone]

## 2016-11-16 NOTE — ED Notes (Addendum)
Pt refusing admission - pt given rx for nitro and asa with f/u to cardiology and states understanding to seek help immediately if worsens. Pt pulled out his own iv and threw out

## 2016-11-16 NOTE — ED Notes (Signed)
Pt has hx of htn and cabg and stents, 2 mi. Has had cp since 11pm last night. Went off bp meds and states his bp was high at home and took two of his wife's hctz which is 50 mg total.  All surgeries are from [facility].

## 2018-05-31 ENCOUNTER — Encounter: Payer: Self-pay | Admitting: Emergency Medicine

## 2018-05-31 ENCOUNTER — Emergency Department
Admission: EM | Admit: 2018-05-31 | Discharge: 2018-05-31 | Disposition: A | Discharge status: Home / Self Care | Payer: Self-pay | Attending: Emergency Medicine | Admitting: Emergency Medicine

## 2018-05-31 ENCOUNTER — Other Ambulatory Visit: Payer: Self-pay

## 2018-05-31 DIAGNOSIS — Z711 Person with feared health complaint in whom no diagnosis is made: Secondary | ICD-10-CM | POA: Insufficient documentation

## 2018-05-31 DIAGNOSIS — Z7982 Long term (current) use of aspirin: Secondary | ICD-10-CM | POA: Insufficient documentation

## 2018-05-31 DIAGNOSIS — F17210 Nicotine dependence, cigarettes, uncomplicated: Secondary | ICD-10-CM | POA: Insufficient documentation

## 2018-05-31 DIAGNOSIS — Z79899 Other long term (current) drug therapy: Secondary | ICD-10-CM | POA: Insufficient documentation

## 2018-05-31 DIAGNOSIS — I10 Essential (primary) hypertension: Secondary | ICD-10-CM | POA: Insufficient documentation

## 2018-05-31 NOTE — ED Provider Notes (Signed)
[facility] Emergency Department Provider Note  ____________________________________________   First MD Initiated Contact with Patient 05/31/18 2203     (approximate)  I have reviewed the triage vital signs and the nursing notes.   HISTORY  Chief Complaint Medical Clearance (Pt was told by [employer] that he needed detox. Pt not wanting detox. )   HPI Jason Atkins is a 47 y.o. male who comes to the emergency department after getting involved in a verbal altercation with his neighbor.  The patient was drinking beer today and his dog ran into his neighbor's yard and attacked his neighbor's cat.  A verbal altercation ensued and police showed up.  They told the patient he could either go to jail tonight or go to the emergency department.  The patient has no medical complaints.  He denies physical altercation.  He has a safe place to go.  He does not want to hurt himself nor anyone else and he would like to go home.    Past Medical History:  Diagnosis Date  . Hx of CABG   . Hypertension     There are no active problems to display for this patient.   Past Surgical History:  Procedure Laterality Date  . CORONARY ARTERY BYPASS GRAFT      Prior to Admission medications   Medication Sig Start Date End Date Taking? Authorizing Provider  aspirin EC 81 MG tablet Take 81 mg by mouth daily.    [provider]  cephALEXin (KEFLEX) 500 MG capsule Take 1 capsule (500 mg total) by mouth 2 (two) times daily. Patient not taking: Reported on 04/08/2016 10/26/15   Jene Every, MD  doxycycline (VIBRA-TABS) 100 MG tablet Take 1 tablet (100 mg total) by mouth 2 (two) times daily. Patient not taking: Reported on 04/08/2016 03/25/15   Ignacia Bayley, PA-C  lisinopril (PRINIVIL,ZESTRIL) 10 MG tablet Take 10 mg by mouth daily.    [provider]  metoprolol succinate (TOPROL-XL) 50 MG 24 hr tablet Take 50 mg by mouth daily. Take with or immediately following a meal.     [provider]  nitroGLYCERIN (NITROSTAT) 0.4 MG SL tablet Place 1 tablet (0.4 mg total) under the tongue every 5 (five) minutes as needed for chest pain. 11/16/16 11/16/17  Emily Filbert, MD  sulfamethoxazole-trimethoprim (BACTRIM DS,SEPTRA DS) 800-160 MG tablet Take 1 tablet by mouth 2 (two) times daily. Patient not taking: Reported on 04/08/2016 10/26/15   Jene Every, MD    Allergies Ibuprofen  No family history on file.  Social History Social History   Tobacco Use  . Smoking status: Current Every Day Smoker    Packs/day: 0.50    Types: Cigarettes  . Smokeless tobacco: Never Used  Substance Use Topics  . Alcohol use: Yes  . Drug use: No    Review of Systems Constitutional: No fever/chills ENT: No sore throat. Cardiovascular: Denies chest pain. Respiratory: Denies shortness of breath. Gastrointestinal: No abdominal pain.  No nausea, no vomiting.  No diarrhea.  No constipation. Musculoskeletal: Negative for back pain. Neurological: Negative for headaches   ____________________________________________   PHYSICAL EXAM:  VITAL SIGNS: ED Triage Vitals  Enc Vitals Group     BP 05/31/18 2155 (!) 136/100     Pulse Rate 05/31/18 2155 (!) 105     Resp 05/31/18 2155 18     Temp 05/31/18 2155 98.6 F (37 C)     Temp Source 05/31/18 2155 Oral     SpO2 05/31/18 2155 97 %  Weight 05/31/18 2156 170 lb (77.1 kg)     Height 05/31/18 2156 5\' 11"  (1.803 m)     Head Circumference --      Peak Flow --      Pain Score 05/31/18 2156 0     Pain Loc --      Pain Edu? --      Excl. in GC? --     Constitutional: Alert and oriented x4 pleasant cooperative no distress Head: Atraumatic. Nose: No congestion/rhinnorhea. Mouth/Throat: No trismus Neck: No stridor.   Cardiovascular: Rate and rhythm Respiratory: Normal respiratory effort.  No retractions. Gastrointestinal: Soft nontender Neurologic:  Normal speech and language. No gross focal neurologic deficits  are appreciated.  Skin:  Skin is warm, dry and intact. No rash noted.    ____________________________________________  LABS (all labs ordered are listed, but only abnormal results are displayed)  Labs Reviewed - No data to display   __________________________________________  EKG   ____________________________________________  RADIOLOGY   ____________________________________________   DIFFERENTIAL includes but not limited to  Alcohol intoxication, assault, depression   PROCEDURES  Procedure(s) performed: no  Procedures  Critical Care performed: no  ____________________________________________   INITIAL IMPRESSION / ASSESSMENT AND PLAN / ED COURSE  Pertinent labs & imaging results that were available during my care of the patient were reviewed by me and considered in my medical decision making (see chart for details).   The patient is hemodynamically stable with no medical complaints.  He does not meet involuntary commitment criteria and he has a safe place to stay.  No indication for any medical work-up whatsoever.  Discharged home.  The patient feels welcome to return at any point should he change his mind and she is to be evaluated further.      ____________________________________________   FINAL CLINICAL IMPRESSION(S) / ED DIAGNOSES  Final diagnoses:  Feared condition not demonstrated      NEW MEDICATIONS STARTED DURING THIS VISIT:  Discharge Medication List as of 05/31/2018 10:09 PM       Note:  This document was prepared using Dragon voice recognition software and may include unintentional dictation errors.      Merrily Brittleifenbark, Piercen Covino, MD 06/01/18 [phone]

## 2018-05-31 NOTE — ED Triage Notes (Signed)
Pt was at home drinking and his dog attacked the neighbors cat. Per pt. Neighbors heard him and his significant other arguing and told pt that he needed detox due to the fact that he was drinking at the time. Pt admits to drinking two 40's today. Pt denies SI or HI and is in NAD.

## 2018-05-31 NOTE — ED Notes (Signed)
Patient states his dog got in fight with neighbors cat when his dog got off leash.  Pt. States argument started between himself and his wife.  Pt. States neighbors called [employer].  Pt. Agreed to come down to ED with officers so he could get checked out.  Pt. Calm and cooperative at this time.  Pt. States he had been drinking beer before incident happened tonight.  Pt. Denies SI or HI.

## 2018-05-31 NOTE — Discharge Instructions (Signed)
It was a pleasure to take care of you today, and thank you for coming to our emergency department.  If you have any questions or concerns before leaving please ask the nurse to grab me and I'm more than happy to go through your aftercare instructions again. ° °If you were prescribed any opioid pain medication today such as Norco, Vicodin, Percocet, morphine, hydrocodone, or oxycodone please make sure you do not drive when you are taking this medication as it can alter your ability to drive safely. ° °If you have any concerns once you are home that you are not improving or are in fact getting worse before you can make it to your follow-up appointment, please do not hesitate to call 911 and come back for further evaluation. ° °Mazi Brailsford, MD ° ° ° °

## 2018-05-31 NOTE — ED Notes (Signed)
Pt. States staying with friend tonight.

## 2018-08-25 ENCOUNTER — Emergency Department
Admission: EM | Admit: 2018-08-25 | Discharge: 2018-08-25 | Disposition: A | Discharge status: Home / Self Care | Payer: Self-pay | Attending: Emergency Medicine | Admitting: Emergency Medicine

## 2018-08-25 ENCOUNTER — Emergency Department: Payer: Self-pay

## 2018-08-25 ENCOUNTER — Other Ambulatory Visit: Payer: Self-pay

## 2018-08-25 DIAGNOSIS — Z79899 Other long term (current) drug therapy: Secondary | ICD-10-CM | POA: Insufficient documentation

## 2018-08-25 DIAGNOSIS — M70842 Other soft tissue disorders related to use, overuse and pressure, left hand: Secondary | ICD-10-CM | POA: Insufficient documentation

## 2018-08-25 DIAGNOSIS — F17210 Nicotine dependence, cigarettes, uncomplicated: Secondary | ICD-10-CM | POA: Insufficient documentation

## 2018-08-25 DIAGNOSIS — Z951 Presence of aortocoronary bypass graft: Secondary | ICD-10-CM | POA: Insufficient documentation

## 2018-08-25 DIAGNOSIS — I10 Essential (primary) hypertension: Secondary | ICD-10-CM | POA: Insufficient documentation

## 2018-08-25 DIAGNOSIS — Z7982 Long term (current) use of aspirin: Secondary | ICD-10-CM | POA: Insufficient documentation

## 2018-08-25 DIAGNOSIS — M778 Other enthesopathies, not elsewhere classified: Secondary | ICD-10-CM

## 2018-08-25 DIAGNOSIS — Y9389 Activity, other specified: Secondary | ICD-10-CM | POA: Insufficient documentation

## 2018-08-25 MED ORDER — PREDNISONE 10 MG PO TABS
10.0000 mg | ORAL_TABLET | Freq: Every day | ORAL | 0 refills | Status: DC
Start: 2018-08-25 — End: 2018-09-17

## 2018-08-25 NOTE — ED Triage Notes (Signed)
Pt c/o right wrist pain from accident at work several days ago.

## 2018-08-25 NOTE — Discharge Instructions (Signed)
Please wear a Velcro wrist brace for 3 to 4 weeks as needed.  Take prednisone as prescribed.  After finishing prednisone you may resume Aleve.

## 2018-08-25 NOTE — ED Provider Notes (Signed)
[facility] EMERGENCY DEPARTMENT Provider Note   CSN: [id number] Arrival date & time: 08/25/18  1341     History   Chief Complaint No chief complaint on file.   HPI Jason Atkins is a 47 y.o. male presents to the emergency department for evaluation of right wrist pain.  Patient states he said some pain on the dorsal aspect of the right wrist present for 2 to 3 days.  Thinks he hit it on something and possibly may have a fracture.  Patient states he had a work.  He is a Chartered certified accountant.  He is been taken Aleve with no improvement.  States he is had some swelling but no numbness or tingling.  Pain is moderate with use.  Has not been wearing a brace.  HPI  Past Medical History:  Diagnosis Date  . Hx of CABG   . Hypertension     There are no active problems to display for this patient.   Past Surgical History:  Procedure Laterality Date  . CORONARY ARTERY BYPASS GRAFT          Home Medications    Prior to Admission medications   Medication Sig Start Date End Date Taking? Authorizing Provider  aspirin EC 81 MG tablet Take 81 mg by mouth daily.    [provider]  cephALEXin (KEFLEX) 500 MG capsule Take 1 capsule (500 mg total) by mouth 2 (two) times daily. Patient not taking: Reported on 04/08/2016 10/26/15   Jene Every, MD  doxycycline (VIBRA-TABS) 100 MG tablet Take 1 tablet (100 mg total) by mouth 2 (two) times daily. Patient not taking: Reported on 04/08/2016 03/25/15   Ignacia Bayley, PA-C  lisinopril (PRINIVIL,ZESTRIL) 10 MG tablet Take 10 mg by mouth daily.    [provider]  metoprolol succinate (TOPROL-XL) 50 MG 24 hr tablet Take 50 mg by mouth daily. Take with or immediately following a meal.    [provider]  nitroGLYCERIN (NITROSTAT) 0.4 MG SL tablet Place 1 tablet (0.4 mg total) under the tongue every 5 (five) minutes as needed for chest pain. 11/16/16 11/16/17  Emily Filbert, MD  predniSONE (DELTASONE) 10 MG  tablet Take 1 tablet (10 mg total) by mouth daily. 6,5,4,3,2,1 six day taper 08/25/18   Evon Slack, PA-C  sulfamethoxazole-trimethoprim (BACTRIM DS,SEPTRA DS) 800-160 MG tablet Take 1 tablet by mouth 2 (two) times daily. Patient not taking: Reported on 04/08/2016 10/26/15   Jene Every, MD    Family History No family history on file.  Social History Social History   Tobacco Use  . Smoking status: Current Every Day Smoker    Packs/day: 0.50    Types: Cigarettes  . Smokeless tobacco: Never Used  Substance Use Topics  . Alcohol use: Yes  . Drug use: No     Allergies   Ibuprofen   Review of Systems Review of Systems  Constitutional: Negative for fever.  Respiratory: Negative for shortness of breath.   Cardiovascular: Negative for chest pain.  Gastrointestinal: Negative for abdominal pain.  Musculoskeletal: Positive for arthralgias ( Dorsal aspect of left wrist.). Negative for back pain, joint swelling and myalgias.  Skin: Negative for rash.  Neurological: Negative for dizziness and headaches.     Physical Exam Updated Vital Signs BP (!) 150/102   Pulse 80   Temp 98.3 F (36.8 C)   Resp 18   Ht 5\' 11"  (1.803 m)   Wt 79.4 kg   SpO2 95%   BMI 24.41 kg/m  Physical Exam  Constitutional: He is oriented to person, place, and time. He appears well-developed and well-nourished.  HENT:  Head: Normocephalic and atraumatic.  Eyes: Conjunctivae are normal.  Neck: Normal range of motion.  Cardiovascular: Normal rate.  Pulmonary/Chest: Effort normal. No respiratory distress.  Musculoskeletal: Normal range of motion.  Left upper extremity shows full range of motion with no swelling warmth or erythema.  Positive Finkelstein's test and mild pain with resisted wrist extension.  Compartments are soft.  Negative CMC grind test.  No catching triggering locking.  Patient has 5 out of 5 strength with wrist flexion and extension of the left upper extremity.  Neurological: He is  alert and oriented to person, place, and time.  Skin: Skin is warm. No rash noted.  Psychiatric: He has a normal mood and affect. His behavior is normal. Thought content normal.     ED Treatments / Results  Labs (all labs ordered are listed, but only abnormal results are displayed) Labs Reviewed - No data to display  EKG None  Radiology Dg Wrist Complete Right  Result Date: 08/25/2018 CLINICAL DATA:  Injury at work, RIGHT wrist pain for 2 days, swelling at distal RIGHT radius without bruising, no prior injury EXAM: RIGHT WRIST - COMPLETE 3+ VIEW COMPARISON:  None FINDINGS: Osseous mineralization normal. Joint spaces preserved. No fracture, dislocation, or bone destruction. IMPRESSION: Normal exam. Electronically Signed   By: Ulyses Southward M.D.   On: 08/25/2018 14:14    Procedures Procedures (including critical care time)  Medications Ordered in ED Medications - No data to display   Initial Impression / Assessment and Plan / ED Course  I have reviewed the triage vital signs and the nursing notes.  Pertinent labs & imaging results that were available during my care of the patient were reviewed by me and considered in my medical decision making (see chart for details).     47 year old male with mild trauma to the left wrist 3 days ago.  Continued pain today x-ray showed evidence of acute bony normality.  Physical exam consistent with possible tendinitis.  No signs of infection, foreign body or compartment syndrome.  Patient will start a prednisone taper and placed into a Velcro wrist brace today in the ER.  He will follow with orthopedics if no improvement.  He understands signs symptoms return to ED for.  Final Clinical Impressions(s) / ED Diagnoses   Final diagnoses:  Tendinitis of left wrist    ED Discharge Orders         Ordered    predniSONE (DELTASONE) 10 MG tablet  Daily     08/25/18 1513           Evon Slack, PA-C 08/25/18 1527    Don Perking, [place],  MD 09/03/18 [phone]

## 2018-09-17 ENCOUNTER — Encounter: Payer: Self-pay | Admitting: Emergency Medicine

## 2018-09-17 ENCOUNTER — Emergency Department
Admission: EM | Admit: 2018-09-17 | Discharge: 2018-09-17 | Disposition: A | Discharge status: Home / Self Care | Payer: Self-pay | Attending: Emergency Medicine | Admitting: Emergency Medicine

## 2018-09-17 DIAGNOSIS — Z951 Presence of aortocoronary bypass graft: Secondary | ICD-10-CM | POA: Insufficient documentation

## 2018-09-17 DIAGNOSIS — T7840XA Allergy, unspecified, initial encounter: Secondary | ICD-10-CM | POA: Insufficient documentation

## 2018-09-17 DIAGNOSIS — I10 Essential (primary) hypertension: Secondary | ICD-10-CM | POA: Insufficient documentation

## 2018-09-17 DIAGNOSIS — F17210 Nicotine dependence, cigarettes, uncomplicated: Secondary | ICD-10-CM | POA: Insufficient documentation

## 2018-09-17 DIAGNOSIS — Z79899 Other long term (current) drug therapy: Secondary | ICD-10-CM | POA: Insufficient documentation

## 2018-09-17 DIAGNOSIS — Z7982 Long term (current) use of aspirin: Secondary | ICD-10-CM | POA: Insufficient documentation

## 2018-09-17 LAB — COMPREHENSIVE METABOLIC PANEL
ALK PHOS: 77 U/L (ref 38–126)
ALT: 45 U/L — AB (ref 0–44)
AST: 95 U/L — ABNORMAL HIGH (ref 15–41)
Albumin: 4.4 g/dL (ref 3.5–5.0)
Anion gap: 12 (ref 5–15)
BUN: 5 mg/dL — ABNORMAL LOW (ref 6–20)
CO2: 26 mmol/L (ref 22–32)
CREATININE: 0.82 mg/dL (ref 0.61–1.24)
Calcium: 9.4 mg/dL (ref 8.9–10.3)
Chloride: 102 mmol/L (ref 98–111)
Glucose, Bld: 140 mg/dL — ABNORMAL HIGH (ref 70–99)
Potassium: 3.7 mmol/L (ref 3.5–5.1)
Sodium: 140 mmol/L (ref 135–145)
TOTAL PROTEIN: 7.7 g/dL (ref 6.5–8.1)
Total Bilirubin: 0.5 mg/dL (ref 0.3–1.2)

## 2018-09-17 LAB — CBC
HCT: 48.9 % (ref 39.0–52.0)
Hemoglobin: 16.3 g/dL (ref 13.0–17.0)
MCH: 32.9 pg (ref 26.0–34.0)
MCHC: 33.3 g/dL (ref 30.0–36.0)
MCV: 98.6 fL (ref 80.0–100.0)
NRBC: 0 % (ref 0.0–0.2)
PLATELETS: 249 10*3/uL (ref 150–400)
RBC: 4.96 MIL/uL (ref 4.22–5.81)
RDW: 12.6 % (ref 11.5–15.5)
WBC: 5.4 10*3/uL (ref 4.0–10.5)

## 2018-09-17 LAB — TROPONIN I: Troponin I: <0.03 ng/mL (ref <0.03)

## 2018-09-17 MED ORDER — DIPHENHYDRAMINE HCL 50 MG/ML IJ SOLN
25.0000 mg | Freq: Once | INTRAMUSCULAR | Status: AC
  Administered 2018-09-17: 25 mg via INTRAVENOUS

## 2018-09-17 MED ORDER — METHYLPREDNISOLONE SODIUM SUCC 125 MG IJ SOLR
125.0000 mg | Freq: Once | INTRAMUSCULAR | Status: AC
  Administered 2018-09-17: 125 mg via INTRAVENOUS

## 2018-09-17 MED ORDER — PREDNISONE 50 MG PO TABS: 50.0000 mg | ORAL_TABLET | Freq: Every day | ORAL | 0 refills | Status: DC

## 2018-09-17 MED ORDER — SODIUM CHLORIDE 0.9 % IV SOLN
1000.0000 mL | Freq: Once | INTRAVENOUS | Status: AC
  Administered 2018-09-17: 1000 mL via INTRAVENOUS

## 2018-09-17 MED ORDER — METHYLPREDNISOLONE SODIUM SUCC 125 MG IJ SOLR
INTRAMUSCULAR | Status: AC
  Administered 2018-09-17: 125 mg via INTRAVENOUS
  Filled 2018-09-17: qty 2

## 2018-09-17 MED ORDER — DIPHENHYDRAMINE HCL 50 MG/ML IJ SOLN
INTRAMUSCULAR | Status: AC
  Administered 2018-09-17: 25 mg via INTRAVENOUS
  Filled 2018-09-17: qty 1

## 2018-09-17 MED ORDER — FAMOTIDINE IN NACL 20-0.9 MG/50ML-% IV SOLN
INTRAVENOUS | Status: AC
  Administered 2018-09-17: 20 mg via INTRAVENOUS
  Filled 2018-09-17: qty 50

## 2018-09-17 MED ORDER — FAMOTIDINE IN NACL 20-0.9 MG/50ML-% IV SOLN
20.0000 mg | Freq: Once | INTRAVENOUS | Status: AC
  Administered 2018-09-17: 20 mg via INTRAVENOUS

## 2018-09-17 NOTE — ED Provider Notes (Signed)
[facility] Emergency Department Provider Note   ____________________________________________    I have reviewed the triage vital signs and the nursing notes.   HISTORY  Chief Complaint Loss of Consciousness     HPI Jason Atkins is a 47 y.o. male who presents after a syncopal episode.  Patient reports that he drank some new [employer] milk that he had never had before and went to the bathroom, while there he started feeling like his skin and face was "on fire "he called his wife over and she got there to some time to see him syncopize.  No reports of seizure activity.  The patient does drink alcohol daily.  He did drink alcohol today.  No history of seizures.  No head injury.  Patient reports feeling better now although he does complain of a rash and still feels as though his skin is burning.  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  Significant history of coronary artery disease, no chest pain or palpitations reported.   Past Medical History:  Diagnosis Date  . Hx of CABG   . Hypertension     There are no active problems to display for this patient.   Past Surgical History:  Procedure Laterality Date  . CORONARY ARTERY BYPASS GRAFT      Prior to Admission medications   Medication Sig Start Date End Date Taking? Authorizing Provider  aspirin EC 81 MG tablet Take 81 mg by mouth daily.    [provider]  lisinopril (PRINIVIL,ZESTRIL) 10 MG tablet Take 10 mg by mouth daily.    [provider]  metoprolol succinate (TOPROL-XL) 50 MG 24 hr tablet Take 50 mg by mouth daily. Take with or immediately following a meal.    [provider]  predniSONE (DELTASONE) 50 MG tablet Take 1 tablet (50 mg total) by mouth daily with breakfast. 09/17/18   Jene Every, MD     Allergies Ibuprofen  No family history on file.  Social History Social History   Tobacco Use  . Smoking status: Current Every Day Smoker    Packs/day: 0.50      Types: Cigarettes  . Smokeless tobacco: Never Used  Substance Use Topics  . Alcohol use: Yes  . Drug use: No    Review of Systems  Constitutional: No fever/chills Eyes: No visual changes.  ENT: No sore throat. Cardiovascular: Denies chest pain. Respiratory: Denies shortness of breath. Gastrointestinal: No abdominal pain.   Genitourinary: Negative for dysuria. Musculoskeletal: Negative for back pain. Skin: Positive rash Neurological: Negative for headaches or weakness   ____________________________________________   PHYSICAL EXAM:  VITAL SIGNS: ED Triage Vitals  Enc Vitals Group     BP 09/17/18 1826 106/76     Pulse Rate 09/17/18 1826 (!) 102     Resp 09/17/18 1826 20     Temp 09/17/18 1826 97.7 F (36.5 C)     Temp Source 09/17/18 1826 Oral     SpO2 09/17/18 1826 94 %     Weight 09/17/18 1827 79.4 kg (175 lb)     Height 09/17/18 1827 1.803 m (5\' 11" )     Head Circumference --      Peak Flow --      Pain Score 09/17/18 1827 8     Pain Loc --      Pain Edu? --      Excl. in GC? --     Constitutional: Alert and oriented.  Eyes: Conjunctivae are normal.  Possible  swelling of the eyelids  Nose: No congestion/rhinnorhea. Mouth/Throat: Mucous membranes are moist.  No intraoral swelling  Cardiovascular: Normal rate, regular rhythm. Grossly normal heart sounds.  Good peripheral circulation. Respiratory: Normal respiratory effort.  No retractions. Gastrointestinal: Soft and nontender. No distention.  No CVA tenderness. Genitourinary: deferred Musculoskeletal: No lower extremity tenderness nor edema.  Warm and well perfused Neurologic:  Normal speech and language. No gross focal neurologic deficits are appreciated.  Skin:  Skin is warm, dry and intact.  Rash to his abdomen consistent with urticaria Psychiatric: Mood and affect are normal. Speech and behavior are normal.  ____________________________________________   LABS (all labs ordered are listed, but only  abnormal results are displayed)  Labs Reviewed  COMPREHENSIVE METABOLIC PANEL - Abnormal; Notable for the following components:      Result Value   Glucose, Bld 140 (*)    BUN 5 (*)    AST 95 (*)    ALT 45 (*)    All other components within normal limits  CBC  TROPONIN I   ____________________________________________  EKG  ED ECG REPORT I, Jene Every, the attending physician, personally viewed and interpreted this ECG.  Date: 09/17/2018  Rhythm: normal sinus rhythm QRS Axis: normal Intervals: normal ST/T Wave abnormalities: normal Narrative Interpretation: no evidence of acute ischemia  ____________________________________________  RADIOLOGY  None ____________________________________________   PROCEDURES  Procedure(s) performed: No  Procedures   Critical Care performed: No ____________________________________________   INITIAL IMPRESSION / ASSESSMENT AND PLAN / ED COURSE  Pertinent labs & imaging results that were available during my care of the patient were reviewed by me and considered in my medical decision making (see chart for details).  Patient presents after syncopal episode.  I suspect this was related to allergic reaction given hives and sensation of "skin burn".  We will treat with IV Benadryl, IV Solu-Medrol, IV Pepcid.  Will check labs, EKG and carefully monitor  ----------------------------------------- 9:15 PM on 09/17/2018 -----------------------------------------  Patient reports complete resolution of rash, he feels quite well, he is anxious to leave, he does not want to stay for any further observation.  Will discharge with 3 days of prednisone Benadryl as needed    ____________________________________________   FINAL CLINICAL IMPRESSION(S) / ED DIAGNOSES  Final diagnoses:  Allergic reaction, initial encounter        Note:  This document was prepared using Dragon voice recognition software and may include unintentional  dictation errors.    Jene Every, MD 09/17/18 2121

## 2018-09-17 NOTE — ED Triage Notes (Signed)
Pt arrives via ems after syncopal episode today. Pt reports alcohol abuse. Pt alert in triage, able to answer questions but delay in responses.   Upon assessment, rash covers chest and arms. Pt states "I feel like I am on fire."

## 2018-09-17 NOTE — ED Notes (Signed)
FIRST NURSE NOTE:  Pt arrived via EMS, pt arrived with 10L NRB in place for sats there were initially in the 70s on EMS arrival.  pt has been using etoh today, per EMS pt had periods of incoherency.  CBG = 168.  BP stable

## 2018-09-17 NOTE — ED Notes (Signed)
Patient reports improvement of symptoms, rash resolved. Will notify MD.

## 2018-09-17 NOTE — ED Notes (Signed)
Patient verbalized understanding of discharge instructions, no questions. Patient ambulated out of ED with steady gait in no distress.  

## 2018-09-30 ENCOUNTER — Emergency Department
Admission: EM | Admit: 2018-09-30 | Discharge: 2018-10-01 | Disposition: A | Discharge status: Home / Self Care | Payer: Self-pay | Attending: Emergency Medicine | Admitting: Emergency Medicine

## 2018-09-30 ENCOUNTER — Encounter: Payer: Self-pay | Admitting: Emergency Medicine

## 2018-09-30 DIAGNOSIS — R4689 Other symptoms and signs involving appearance and behavior: Secondary | ICD-10-CM

## 2018-09-30 DIAGNOSIS — F918 Other conduct disorders: Secondary | ICD-10-CM | POA: Insufficient documentation

## 2018-09-30 DIAGNOSIS — F17210 Nicotine dependence, cigarettes, uncomplicated: Secondary | ICD-10-CM | POA: Insufficient documentation

## 2018-09-30 DIAGNOSIS — I2510 Atherosclerotic heart disease of native coronary artery without angina pectoris: Secondary | ICD-10-CM | POA: Insufficient documentation

## 2018-09-30 DIAGNOSIS — K920 Hematemesis: Secondary | ICD-10-CM | POA: Insufficient documentation

## 2018-09-30 DIAGNOSIS — Z951 Presence of aortocoronary bypass graft: Secondary | ICD-10-CM | POA: Insufficient documentation

## 2018-09-30 DIAGNOSIS — Y908 Blood alcohol level of 240 mg/100 ml or more: Secondary | ICD-10-CM | POA: Insufficient documentation

## 2018-09-30 DIAGNOSIS — F1020 Alcohol dependence, uncomplicated: Secondary | ICD-10-CM | POA: Insufficient documentation

## 2018-09-30 DIAGNOSIS — I10 Essential (primary) hypertension: Secondary | ICD-10-CM | POA: Insufficient documentation

## 2018-09-30 DIAGNOSIS — Z7982 Long term (current) use of aspirin: Secondary | ICD-10-CM | POA: Insufficient documentation

## 2018-09-30 DIAGNOSIS — Z046 Encounter for general psychiatric examination, requested by authority: Secondary | ICD-10-CM | POA: Insufficient documentation

## 2018-09-30 DIAGNOSIS — Z955 Presence of coronary angioplasty implant and graft: Secondary | ICD-10-CM | POA: Insufficient documentation

## 2018-09-30 DIAGNOSIS — F1010 Alcohol abuse, uncomplicated: Secondary | ICD-10-CM

## 2018-09-30 DIAGNOSIS — Z79899 Other long term (current) drug therapy: Secondary | ICD-10-CM | POA: Insufficient documentation

## 2018-09-30 DIAGNOSIS — F1994 Other psychoactive substance use, unspecified with psychoactive substance-induced mood disorder: Secondary | ICD-10-CM

## 2018-09-30 HISTORY — DX: Acute myocardial infarction, unspecified: I21.9

## 2018-09-30 HISTORY — DX: Atherosclerotic heart disease of native coronary artery without angina pectoris: I25.10

## 2018-09-30 LAB — URINE DRUG SCREEN, QUALITATIVE (ARMC ONLY)
Amphetamines, Ur Screen: NOT DETECTED
Barbiturates, Ur Screen: NOT DETECTED
Benzodiazepine, Ur Scrn: NOT DETECTED
Cannabinoid 50 Ng, Ur ~~LOC~~: NOT DETECTED
Cocaine Metabolite,Ur ~~LOC~~: NOT DETECTED
MDMA (ECSTASY) UR SCREEN: NOT DETECTED
Methadone Scn, Ur: NOT DETECTED
OPIATE, UR SCREEN: NOT DETECTED
PHENCYCLIDINE (PCP) UR S: NOT DETECTED
Tricyclic, Ur Screen: NOT DETECTED

## 2018-09-30 LAB — CBC
HEMATOCRIT: 46.3 % (ref 39.0–52.0)
Hemoglobin: 15.9 g/dL (ref 13.0–17.0)
MCH: 33.3 pg (ref 26.0–34.0)
MCHC: 34.3 g/dL (ref 30.0–36.0)
MCV: 96.9 fL (ref 80.0–100.0)
Platelets: 111 10*3/uL — ABNORMAL LOW (ref 150–400)
RBC: 4.78 MIL/uL (ref 4.22–5.81)
RDW: 12.7 % (ref 11.5–15.5)
WBC: 5 10*3/uL (ref 4.0–10.5)
nRBC: 0 % (ref 0.0–0.2)

## 2018-09-30 LAB — COMPREHENSIVE METABOLIC PANEL
ALK PHOS: 74 U/L (ref 38–126)
ALT: 55 U/L — AB (ref 0–44)
AST: 139 U/L — AB (ref 15–41)
Albumin: 4.8 g/dL (ref 3.5–5.0)
Anion gap: 14 (ref 5–15)
CO2: 27 mmol/L (ref 22–32)
CREATININE: 0.52 mg/dL — AB (ref 0.61–1.24)
Calcium: 8.6 mg/dL — ABNORMAL LOW (ref 8.9–10.3)
Chloride: 99 mmol/L (ref 98–111)
GFR calc Af Amer: >60 mL/min (ref >60)
Glucose, Bld: 88 mg/dL (ref 70–99)
Potassium: 3.8 mmol/L (ref 3.5–5.1)
Sodium: 140 mmol/L (ref 135–145)
Total Bilirubin: 0.9 mg/dL (ref 0.3–1.2)
Total Protein: 8.8 g/dL — ABNORMAL HIGH (ref 6.5–8.1)

## 2018-09-30 LAB — ACETAMINOPHEN LEVEL: Acetaminophen (Tylenol), Serum: <10 ug/mL — ABNORMAL LOW (ref 10–30)

## 2018-09-30 LAB — SALICYLATE LEVEL: Salicylate Lvl: <7 mg/dL (ref 2.8–30.0)

## 2018-09-30 LAB — ETHANOL: ALCOHOL ETHYL (B): 352 mg/dL — AB (ref <10)

## 2018-09-30 MED ORDER — LORAZEPAM 2 MG/ML IJ SOLN: 0.0000 mg | Freq: Four times a day (QID) | INTRAMUSCULAR | Status: DC

## 2018-09-30 MED ORDER — VITAMIN B-1 100 MG PO TABS
100.0000 mg | ORAL_TABLET | Freq: Every day | ORAL | Status: DC
  Administered 2018-10-01: 100 mg via ORAL
  Filled 2018-09-30: qty 1

## 2018-09-30 MED ORDER — LORAZEPAM 2 MG PO TABS
0.0000 mg | ORAL_TABLET | Freq: Four times a day (QID) | ORAL | Status: DC
  Administered 2018-10-01: 1 mg via ORAL
  Filled 2018-09-30: qty 1

## 2018-09-30 MED ORDER — LORAZEPAM 2 MG/ML IJ SOLN: 0.0000 mg | Freq: Two times a day (BID) | INTRAMUSCULAR | Status: DC

## 2018-09-30 MED ORDER — LORAZEPAM 2 MG PO TABS: 0.0000 mg | ORAL_TABLET | Freq: Two times a day (BID) | ORAL | Status: DC

## 2018-09-30 MED ORDER — VITAMIN B-1 100 MG PO TABS
100.0000 mg | ORAL_TABLET | Freq: Once | ORAL | Status: AC
  Administered 2018-09-30: 100 mg via ORAL
  Filled 2018-09-30: qty 1

## 2018-09-30 MED ORDER — THIAMINE HCL 100 MG/ML IJ SOLN: 100.0000 mg | Freq: Every day | INTRAMUSCULAR | Status: DC

## 2018-09-30 NOTE — ED Notes (Signed)
Pt dressed out into appropriate behavioral health clothing with this tech and a Regulatory affairs officer in the rm. Pt belongings consist of a set of keys, brown shoes, a green shirt, green/gray jacket, brown pants and black sweat pants. Pt calm and cooperative while dressing out. Pt in middle of triage with officer until a rm becomes available.
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
## 2018-09-30 NOTE — ED Notes (Signed)
Patient asking to discharge.  Staff informed patient that he was IVC'd and unable to leave until assessed by psychiatry.
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
## 2018-09-30 NOTE — ED Notes (Signed)
Pt cooperative with RN. Denies SI/HI and AVH.  Pt states he is here because his wife is mad at him (another woman drove him home from work).  Per patient his wife stated, "Ill get you out one way or another."   Pt denies harming anyone, denies drug use.  Admits to drinking alcohol.   Maintained on 15 minute checks and observation by security for safety.

## 2018-09-30 NOTE — ED Notes (Signed)
TTS at bedside. 

## 2018-09-30 NOTE — ED Notes (Signed)
ED Provider at bedside. 

## 2018-10-01 DIAGNOSIS — F1010 Alcohol abuse, uncomplicated: Secondary | ICD-10-CM

## 2018-10-01 DIAGNOSIS — F1994 Other psychoactive substance use, unspecified with psychoactive substance-induced mood disorder: Secondary | ICD-10-CM

## 2018-10-01 MED ORDER — LISINOPRIL 5 MG PO TABS
10.0000 mg | ORAL_TABLET | Freq: Every day | ORAL | Status: DC
  Administered 2018-10-01: 10 mg via ORAL
  Filled 2018-10-01: qty 1

## 2018-10-01 MED ORDER — ASPIRIN EC 81 MG PO TBEC
81.0000 mg | DELAYED_RELEASE_TABLET | Freq: Every day | ORAL | Status: DC
  Administered 2018-10-01: 81 mg via ORAL
  Filled 2018-10-01: qty 1

## 2018-10-01 MED ORDER — CLOPIDOGREL BISULFATE 75 MG PO TABS
75.0000 mg | ORAL_TABLET | Freq: Every day | ORAL | Status: DC
  Administered 2018-10-01: 75 mg via ORAL
  Filled 2018-10-01: qty 1

## 2018-10-01 MED ORDER — METOPROLOL SUCCINATE ER 50 MG PO TB24
100.0000 mg | ORAL_TABLET | Freq: Every day | ORAL | Status: DC
  Administered 2018-10-01: 100 mg via ORAL
  Filled 2018-10-01: qty 2
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
## 2018-10-01 NOTE — ED Notes (Signed)
[facility]([place]).

## 2018-10-01 NOTE — ED Notes (Signed)
Pt discharged to lobby. VS stable. Discharge instructions reviewed with patient. Patient signed for discharge. All belongings returned to patient. Pt denies SI/HI.

## 2018-10-01 NOTE — ED Notes (Signed)
Pt's wife called and stated pt takes Plavix 75mg  daily, Toprol XL 100mg  daily, Lisinopril 10mg  daily and Aspirin 81mg  daily. MD notified and new orders received.

## 2018-10-01 NOTE — ED Notes (Signed)
Pt dressing for discharge. Maintained on 15 minute checks and observation by security camera for safety. 

## 2018-10-01 NOTE — ED Notes (Signed)
SOC complete.  

## 2018-10-01 NOTE — ED Provider Notes (Signed)
Gust patient with Dr. Mat Carnelay packs.  Patient is no longer a threat to himself or anyone else.  We will have him try to follow-up with AA and go home.   Arnaldo NatalMalinda, Paul F, MD 10/01/18 [phone]

## 2018-10-01 NOTE — ED Notes (Addendum)
Pt denies depression, anxiety, SI/HI/AVH at time of assessment. Pt reports his wife got mad at him and had him brought here. Pt reports he just wants to go home stating "I'm okay". Contracted for safety. Advised awaiting placement.

## 2018-10-01 NOTE — Discharge Instructions (Addendum)
Please return for any further problems.  Please try to follow-up with Alcoholics Anonymous.  They are the best thing we have to help with drinking.  Do your best to refrain from drinking.  Again you can always return here for any further problems.

## 2018-10-01 NOTE — ED Notes (Signed)
SOC in progress.  

## 2018-10-01 NOTE — ED Notes (Signed)
Pt given breakfast tray

## 2019-07-28 ENCOUNTER — Emergency Department

## 2019-07-28 ENCOUNTER — Inpatient Hospital Stay
Admission: EM | Admit: 2019-07-28 | Discharge: 2019-07-29 | DRG: 103 | Attending: Internal Medicine | Admitting: Internal Medicine

## 2019-07-28 ENCOUNTER — Encounter: Payer: Self-pay | Admitting: Medical Oncology

## 2019-07-28 DIAGNOSIS — N179 Acute kidney failure, unspecified: Secondary | ICD-10-CM | POA: Diagnosis present

## 2019-07-28 DIAGNOSIS — I10 Essential (primary) hypertension: Secondary | ICD-10-CM | POA: Diagnosis present

## 2019-07-28 DIAGNOSIS — K7290 Hepatic failure, unspecified without coma: Secondary | ICD-10-CM | POA: Diagnosis not present

## 2019-07-28 DIAGNOSIS — K7682 Hepatic encephalopathy: Secondary | ICD-10-CM

## 2019-07-28 DIAGNOSIS — T796XXA Traumatic ischemia of muscle, initial encounter: Secondary | ICD-10-CM | POA: Diagnosis present

## 2019-07-28 DIAGNOSIS — S0083XA Contusion of other part of head, initial encounter: Secondary | ICD-10-CM | POA: Diagnosis present

## 2019-07-28 DIAGNOSIS — Z91018 Allergy to other foods: Secondary | ICD-10-CM | POA: Diagnosis not present

## 2019-07-28 DIAGNOSIS — G9341 Metabolic encephalopathy: Secondary | ICD-10-CM | POA: Diagnosis present

## 2019-07-28 DIAGNOSIS — Z20828 Contact with and (suspected) exposure to other viral communicable diseases: Secondary | ICD-10-CM | POA: Diagnosis present

## 2019-07-28 DIAGNOSIS — I2510 Atherosclerotic heart disease of native coronary artery without angina pectoris: Secondary | ICD-10-CM | POA: Diagnosis present

## 2019-07-28 DIAGNOSIS — F0781 Postconcussional syndrome: Secondary | ICD-10-CM | POA: Diagnosis not present

## 2019-07-28 DIAGNOSIS — Z951 Presence of aortocoronary bypass graft: Secondary | ICD-10-CM | POA: Diagnosis not present

## 2019-07-28 DIAGNOSIS — E876 Hypokalemia: Secondary | ICD-10-CM | POA: Diagnosis present

## 2019-07-28 DIAGNOSIS — R4182 Altered mental status, unspecified: Secondary | ICD-10-CM

## 2019-07-28 DIAGNOSIS — I252 Old myocardial infarction: Secondary | ICD-10-CM

## 2019-07-28 DIAGNOSIS — K760 Fatty (change of) liver, not elsewhere classified: Secondary | ICD-10-CM | POA: Diagnosis present

## 2019-07-28 DIAGNOSIS — Z886 Allergy status to analgesic agent status: Secondary | ICD-10-CM | POA: Diagnosis not present

## 2019-07-28 DIAGNOSIS — F17210 Nicotine dependence, cigarettes, uncomplicated: Secondary | ICD-10-CM | POA: Diagnosis present

## 2019-07-28 DIAGNOSIS — Z888 Allergy status to other drugs, medicaments and biological substances status: Secondary | ICD-10-CM

## 2019-07-28 DIAGNOSIS — T07XXXA Unspecified multiple injuries, initial encounter: Secondary | ICD-10-CM

## 2019-07-28 LAB — LACTIC ACID, PLASMA: Lactic Acid, Venous: 1.1 mmol/L (ref 0.5–1.9)

## 2019-07-28 LAB — APTT: aPTT: 28 seconds (ref 24–36)

## 2019-07-28 LAB — SARS CORONAVIRUS 2 BY RT PCR (HOSPITAL ORDER, PERFORMED IN ~~LOC~~ HOSPITAL LAB): SARS Coronavirus 2: NEGATIVE

## 2019-07-28 LAB — COMPREHENSIVE METABOLIC PANEL
ALT: 80 U/L — ABNORMAL HIGH (ref 0–44)
AST: 193 U/L — ABNORMAL HIGH (ref 15–41)
Albumin: 5.4 g/dL — ABNORMAL HIGH (ref 3.5–5.0)
Alkaline Phosphatase: 61 U/L (ref 38–126)
Anion gap: 26 — ABNORMAL HIGH (ref 5–15)
BUN: 73 mg/dL — ABNORMAL HIGH (ref 6–20)
CO2: 21 mmol/L — ABNORMAL LOW (ref 22–32)
Calcium: 9.8 mg/dL (ref 8.9–10.3)
Chloride: 89 mmol/L — ABNORMAL LOW (ref 98–111)
Creatinine, Ser: 1.94 mg/dL — ABNORMAL HIGH (ref 0.61–1.24)
GFR calc Af Amer: 46 mL/min — ABNORMAL LOW (ref >60)
GFR calc non Af Amer: 40 mL/min — ABNORMAL LOW (ref >60)
Glucose, Bld: 81 mg/dL (ref 70–99)
Potassium: 2.8 mmol/L — ABNORMAL LOW (ref 3.5–5.1)
Sodium: 136 mmol/L (ref 135–145)
Total Bilirubin: 4.2 mg/dL — ABNORMAL HIGH (ref 0.3–1.2)
Total Protein: 9 g/dL — ABNORMAL HIGH (ref 6.5–8.1)

## 2019-07-28 LAB — CBC
HCT: 38 % — ABNORMAL LOW (ref 39.0–52.0)
Hemoglobin: 13.1 g/dL (ref 13.0–17.0)
MCH: 33.2 pg (ref 26.0–34.0)
MCHC: 34.5 g/dL (ref 30.0–36.0)
MCV: 96.4 fL (ref 80.0–100.0)
Platelets: 125 10*3/uL — ABNORMAL LOW (ref 150–400)
RBC: 3.94 MIL/uL — ABNORMAL LOW (ref 4.22–5.81)
RDW: 12.9 % (ref 11.5–15.5)
WBC: 10.2 10*3/uL (ref 4.0–10.5)
nRBC: 0 % (ref 0.0–0.2)

## 2019-07-28 LAB — HEMOGLOBIN A1C
Hgb A1c MFr Bld: 4.8 % (ref 4.8–5.6)
Mean Plasma Glucose: 91.06 mg/dL

## 2019-07-28 LAB — URINE DRUG SCREEN, QUALITATIVE (ARMC ONLY)
Amphetamines, Ur Screen: NOT DETECTED
Barbiturates, Ur Screen: NOT DETECTED
Benzodiazepine, Ur Scrn: POSITIVE — AB
Cannabinoid 50 Ng, Ur ~~LOC~~: NOT DETECTED
Cocaine Metabolite,Ur ~~LOC~~: NOT DETECTED
MDMA (Ecstasy)Ur Screen: NOT DETECTED
Methadone Scn, Ur: NOT DETECTED
Opiate, Ur Screen: NOT DETECTED
Phencyclidine (PCP) Ur S: NOT DETECTED
Tricyclic, Ur Screen: NOT DETECTED

## 2019-07-28 LAB — AMMONIA: Ammonia: 28 umol/L (ref 9–35)

## 2019-07-28 LAB — DIFFERENTIAL
Abs Immature Granulocytes: 0.07 10*3/uL (ref 0.00–0.07)
Basophils Absolute: 0.1 10*3/uL (ref 0.0–0.1)
Basophils Relative: 1 %
Eosinophils Absolute: 0 10*3/uL (ref 0.0–0.5)
Eosinophils Relative: 0 %
Immature Granulocytes: 1 %
Lymphocytes Relative: 12 %
Lymphs Abs: 1.3 10*3/uL (ref 0.7–4.0)
Monocytes Absolute: 2.2 10*3/uL — ABNORMAL HIGH (ref 0.1–1.0)
Monocytes Relative: 22 %
Neutro Abs: 6.6 10*3/uL (ref 1.7–7.7)
Neutrophils Relative %: 64 %

## 2019-07-28 LAB — SALICYLATE LEVEL: Salicylate Lvl: <7 mg/dL (ref 2.8–30.0)

## 2019-07-28 LAB — URINALYSIS, COMPLETE (UACMP) WITH MICROSCOPIC
Bacteria, UA: NONE SEEN
Bilirubin Urine: NEGATIVE
Glucose, UA: NEGATIVE mg/dL
Hgb urine dipstick: NEGATIVE
Ketones, ur: 5 mg/dL — AB
Leukocytes,Ua: NEGATIVE
Nitrite: NEGATIVE
Protein, ur: NEGATIVE mg/dL
Specific Gravity, Urine: 1.016 (ref 1.005–1.030)
Squamous Epithelial / HPF: NONE SEEN (ref 0–5)
pH: 5 (ref 5.0–8.0)

## 2019-07-28 LAB — TROPONIN I (HIGH SENSITIVITY)
Troponin I (High Sensitivity): 68 ng/L — ABNORMAL HIGH (ref <18)
Troponin I (High Sensitivity): 56 ng/L — ABNORMAL HIGH (ref <18)

## 2019-07-28 LAB — CK: Total CK: 4794 U/L — ABNORMAL HIGH (ref 49–397)

## 2019-07-28 LAB — ACETAMINOPHEN LEVEL: Acetaminophen (Tylenol), Serum: <10 ug/mL — ABNORMAL LOW (ref 10–30)

## 2019-07-28 LAB — MRSA PCR SCREENING: MRSA by PCR: NEGATIVE

## 2019-07-28 LAB — PROTIME-INR
INR: 1 (ref 0.8–1.2)
Prothrombin Time: 12.8 seconds (ref 11.4–15.2)

## 2019-07-28 LAB — TSH: TSH: 0.398 u[IU]/mL (ref 0.350–4.500)

## 2019-07-28 LAB — ETHANOL: Alcohol, Ethyl (B): <10 mg/dL (ref <10)

## 2019-07-28 MED ORDER — HYDROCHLOROTHIAZIDE 25 MG PO TABS
25.0000 mg | ORAL_TABLET | Freq: Every day | ORAL | Status: DC
  Administered 2019-07-28 – 2019-07-29 (×2): 25 mg via ORAL
  Filled 2019-07-28 (×2): qty 1

## 2019-07-28 MED ORDER — LORAZEPAM 2 MG/ML IJ SOLN
0.0000 mg | Freq: Four times a day (QID) | INTRAMUSCULAR | Status: DC
  Administered 2019-07-28 – 2019-07-29 (×3): 1 mg via INTRAVENOUS
  Filled 2019-07-28 (×3): qty 1

## 2019-07-28 MED ORDER — ONDANSETRON HCL 4 MG PO TABS: 4.0000 mg | ORAL_TABLET | Freq: Four times a day (QID) | ORAL | Status: DC | PRN

## 2019-07-28 MED ORDER — POTASSIUM CHLORIDE CRYS ER 20 MEQ PO TBCR
60.0000 meq | EXTENDED_RELEASE_TABLET | Freq: Once | ORAL | Status: AC
  Administered 2019-07-28: 60 meq via ORAL
  Filled 2019-07-28: qty 3

## 2019-07-28 MED ORDER — LACTULOSE 10 GM/15ML PO SOLN
20.0000 g | Freq: Two times a day (BID) | ORAL | Status: DC
  Administered 2019-07-28 – 2019-07-29 (×3): 20 g via ORAL
  Filled 2019-07-28 (×3): qty 30

## 2019-07-28 MED ORDER — FOLIC ACID 1 MG PO TABS
1.0000 mg | ORAL_TABLET | Freq: Every day | ORAL | Status: DC
  Administered 2019-07-28 – 2019-07-29 (×2): 1 mg via ORAL
  Filled 2019-07-28 (×2): qty 1

## 2019-07-28 MED ORDER — LORAZEPAM 2 MG/ML IJ SOLN: 0.0000 mg | Freq: Two times a day (BID) | INTRAMUSCULAR | Status: DC

## 2019-07-28 MED ORDER — LORAZEPAM 2 MG/ML IJ SOLN: 2.0000 mg | INTRAMUSCULAR | Status: DC | PRN

## 2019-07-28 MED ORDER — ONDANSETRON HCL 4 MG/2ML IJ SOLN: 4.0000 mg | Freq: Four times a day (QID) | INTRAMUSCULAR | Status: DC | PRN

## 2019-07-28 MED ORDER — POTASSIUM CHLORIDE IN NACL 40-0.9 MEQ/L-% IV SOLN
INTRAVENOUS | Status: DC
  Administered 2019-07-28 – 2019-07-29 (×4): 125 mL/h via INTRAVENOUS
  Filled 2019-07-28 (×6): qty 1000

## 2019-07-28 MED ORDER — ADULT MULTIVITAMIN W/MINERALS CH
1.0000 | ORAL_TABLET | Freq: Every day | ORAL | Status: DC
  Administered 2019-07-28 – 2019-07-29 (×2): 1 via ORAL
  Filled 2019-07-28 (×2): qty 1

## 2019-07-28 MED ORDER — VITAMIN B-1 100 MG PO TABS
100.0000 mg | ORAL_TABLET | Freq: Every day | ORAL | Status: DC
  Administered 2019-07-28 – 2019-07-29 (×2): 100 mg via ORAL
  Filled 2019-07-28 (×2): qty 1

## 2019-07-28 MED ORDER — SODIUM CHLORIDE 0.9% FLUSH: 3.0000 mL | Freq: Once | INTRAVENOUS | Status: DC

## 2019-07-28 MED ORDER — THIAMINE HCL 100 MG/ML IJ SOLN: 100.0000 mg | Freq: Every day | INTRAMUSCULAR | Status: DC

## 2019-07-28 MED ORDER — SODIUM CHLORIDE 0.9 % IV BOLUS
1000.0000 mL | Freq: Once | INTRAVENOUS | Status: AC
  Administered 2019-07-28: 1000 mL via INTRAVENOUS

## 2019-07-28 MED ORDER — LORAZEPAM 2 MG PO TABS: 2.0000 mg | ORAL_TABLET | ORAL | Status: DC | PRN
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
## 2019-07-28 NOTE — Plan of Care (Signed)
  Problem: Nutrition: Goal: Adequate nutrition will be maintained Outcome: Progressing   Problem: Coping: Goal: Level of anxiety will decrease Outcome: Progressing   Problem: Pain Managment: Goal: General experience of comfort will improve Outcome: Progressing   

## 2019-07-28 NOTE — Plan of Care (Signed)
  Problem: Safety: Goal: Ability to remain free from injury will improve Outcome: Progressing   Problem: Skin Integrity: Goal: Risk for impaired skin integrity will decrease Outcome: Progressing   

## 2019-07-29 LAB — COMPREHENSIVE METABOLIC PANEL
ALT: 56 U/L — ABNORMAL HIGH (ref 0–44)
AST: 123 U/L — ABNORMAL HIGH (ref 15–41)
Albumin: 3.8 g/dL (ref 3.5–5.0)
Alkaline Phosphatase: 47 U/L (ref 38–126)
Anion gap: 8 (ref 5–15)
BUN: 20 mg/dL (ref 6–20)
CO2: 28 mmol/L (ref 22–32)
Calcium: 8.9 mg/dL (ref 8.9–10.3)
Chloride: 101 mmol/L (ref 98–111)
Creatinine, Ser: 0.5 mg/dL — ABNORMAL LOW (ref 0.61–1.24)
GFR calc Af Amer: >60 mL/min (ref >60)
GFR calc non Af Amer: >60 mL/min (ref >60)
Glucose, Bld: 95 mg/dL (ref 70–99)
Potassium: 3.3 mmol/L — ABNORMAL LOW (ref 3.5–5.1)
Sodium: 137 mmol/L (ref 135–145)
Total Bilirubin: 2.4 mg/dL — ABNORMAL HIGH (ref 0.3–1.2)
Total Protein: 6.7 g/dL (ref 6.5–8.1)

## 2019-07-29 MED ORDER — SODIUM CHLORIDE 0.9 % IV SOLN: INTRAVENOUS | Status: DC

## 2019-07-29 MED ORDER — POTASSIUM CHLORIDE CRYS ER 20 MEQ PO TBCR: 40.0000 meq | EXTENDED_RELEASE_TABLET | Freq: Once | ORAL | Status: DC

## 2019-07-29 NOTE — Plan of Care (Signed)
  Problem: Education: Goal: Knowledge of General Education information will improve Description: Including pain rating scale, medication(s)/side effects and non-pharmacologic comfort measures Outcome: Not Progressing   Problem: Safety: Goal: Ability to remain free from injury will improve Outcome: Progressing  Officer at bedside
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
## 2019-07-29 NOTE — Consult Note (Signed)
PHARMACY CONSULT NOTE - FOLLOW UP  Pharmacy Consult for Electrolyte Monitoring and Replacement   Recent Labs: Potassium (mmol/L)  Date Value  07/28/2019 2.8 (L)  01/08/2014 3.5   Calcium (mg/dL)  Date Value  07/28/2019 9.8   Calcium, Total (mg/dL)  Date Value  01/08/2014 9.3   Albumin (g/dL)  Date Value  07/28/2019 5.4 (H)  01/08/2014 4.4   Sodium (mmol/L)  Date Value  07/28/2019 136  01/08/2014 139     Assessment: Potassium 2.8, pt received KCl 60 mEq. K+ 3.3 today.   Goal of Therapy:  WNL   Plan:  Plan to discharge today will give KCl 40 mEq x 1 per Dr. Benjie Karvonen.   Oswald Hillock ,PharmD Clinical Pharmacist 07/29/2019 9:43 AM

## 2019-07-30 LAB — HIV ANTIBODY (ROUTINE TESTING W REFLEX): HIV Screen 4th Generation wRfx: NONREACTIVE

## 2019-11-17 ENCOUNTER — Emergency Department
Admission: EM | Admit: 2019-11-17 | Discharge: 2019-11-17 | Disposition: A | Discharge status: Home / Self Care | Attending: Emergency Medicine | Admitting: Emergency Medicine

## 2019-11-17 ENCOUNTER — Other Ambulatory Visit: Payer: Self-pay

## 2019-11-17 ENCOUNTER — Encounter: Payer: Self-pay | Admitting: Emergency Medicine

## 2019-11-17 ENCOUNTER — Emergency Department

## 2019-11-17 DIAGNOSIS — F17210 Nicotine dependence, cigarettes, uncomplicated: Secondary | ICD-10-CM | POA: Insufficient documentation

## 2019-11-17 DIAGNOSIS — I2510 Atherosclerotic heart disease of native coronary artery without angina pectoris: Secondary | ICD-10-CM | POA: Insufficient documentation

## 2019-11-17 DIAGNOSIS — Y999 Unspecified external cause status: Secondary | ICD-10-CM | POA: Insufficient documentation

## 2019-11-17 DIAGNOSIS — S90512A Abrasion, left ankle, initial encounter: Secondary | ICD-10-CM

## 2019-11-17 DIAGNOSIS — Y9389 Activity, other specified: Secondary | ICD-10-CM | POA: Insufficient documentation

## 2019-11-17 DIAGNOSIS — Y33XXXA Other specified events, undetermined intent, initial encounter: Secondary | ICD-10-CM | POA: Insufficient documentation

## 2019-11-17 DIAGNOSIS — S7011XA Contusion of right thigh, initial encounter: Secondary | ICD-10-CM

## 2019-11-17 DIAGNOSIS — I10 Essential (primary) hypertension: Secondary | ICD-10-CM | POA: Insufficient documentation

## 2019-11-17 DIAGNOSIS — Y929 Unspecified place or not applicable: Secondary | ICD-10-CM | POA: Insufficient documentation
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
## 2019-11-17 NOTE — Discharge Instructions (Signed)
Please follow-up with your primary care provider for symptoms that are not improving over the next few days.  Ice the area on and off throughout the day.  Keep the abrasions clean and dry.  Apply antibiotic ointment to the areas 2 times per day.  Observe closely for infection.  If you have any concerns for infection, please follow-up with primary care or return to the emergency department.

## 2019-11-17 NOTE — ED Provider Notes (Signed)
[facility] Emergency Department Provider Note ____________________________________________  Time seen: Approximately 5:22 PM  I have reviewed the triage vital signs and the nursing notes.   HISTORY  Chief Complaint Leg Pain    HPI Jason Atkins is a 48 y.o. male who presents to the emergency department for evaluation and treatment of right lower extremity pain and left ankle pain.  Patient was working on a vehicle last night and states that the jack slipped and the car fell onto his legs.  Main area of pain is on the right upper thigh.  He states that he tried to get his left foot out from underneath him and had to drag it across the pavement.  He has some abrasions to the left ankle.  He has a large bruise to the upper thigh.  He has been ambulatory today, but states that the areas are becoming more more tender.  No alleviating measures attempted prior to arrival. Past Medical History:  Diagnosis Date  . Coronary artery disease   . Heart attack ([place])    x4  . Hx of CABG   . Hypertension     Patient Active Problem List   Diagnosis Date Noted  . Hepatic encephalopathy ([place]) 07/28/2019  . Alcohol abuse 10/01/2018  . Substance induced mood disorder ([place]) 10/01/2018    Past Surgical History:  Procedure Laterality Date  . CARDIAC SURGERY    . CORONARY ARTERY BYPASS GRAFT      Prior to Admission medications   Medication Sig Start Date End Date Taking? Authorizing Provider  hydrochlorothiazide (HYDRODIURIL) 25 MG tablet Take 25 mg by mouth daily.    [provider]  Multiple Vitamin (MULTIVITAMIN WITH MINERALS) TABS tablet Take 1 tablet by mouth daily.    [provider]    Allergies Almond oil, Cashew nut oil, Benadryl [diphenhydramine], and Ibuprofen  No family history on file.  Social History Social History   Tobacco Use  . Smoking status: Current Every Day Smoker    Packs/day: 0.50    Types: Cigarettes  . Smokeless tobacco:  Never Used  Substance Use Topics  . Alcohol use: Yes    Comment: 2 40s per day  . Drug use: No    Review of Systems Constitutional: Negative for fever. Cardiovascular: Negative for chest pain. Respiratory: Negative for shortness of breath. Musculoskeletal: Positive for right upper leg pain and left ankle pain. Skin: Positive for abrasions to the left ankle. Neurological: Negative for decrease in sensation  ____________________________________________   PHYSICAL EXAM:  VITAL SIGNS: ED Triage Vitals  Enc Vitals Group     BP 11/17/19 1309 (!) 173/103     Pulse Rate 11/17/19 1309 (!) 112     Resp 11/17/19 1309 20     Temp 11/17/19 1309 98.8 F (37.1 C)     Temp Source 11/17/19 1309 Oral     SpO2 11/17/19 1309 98 %     Weight 11/17/19 1256 180 lb (81.6 kg)     Height 11/17/19 1256 5\' 11"  (1.803 m)     Head Circumference --      Peak Flow --      Pain Score 11/17/19 1255 6     Pain Loc --      Pain Edu? --      Excl. in [place]? --     Constitutional: Alert and oriented. Well appearing and in no acute distress. Eyes: Conjunctivae are clear without discharge or drainage Head: Atraumatic Neck: Supple. Respiratory: No cough.  Respirations are even and unlabored. Musculoskeletal: Focal tenderness over the area of pain on the right upper thigh. Neurologic: Motor and sensation is intact Skin: Hematoma to the right upper thigh.  Abrasions to the left lateral ankle Psychiatric: Affect and behavior are appropriate.  ____________________________________________   LABS (all labs ordered are listed, but only abnormal results are displayed)  Labs Reviewed - No data to display ____________________________________________  RADIOLOGY  Image of the right femur is negative for acute findings per radiology. ____________________________________________   PROCEDURES  Procedures  ____________________________________________   INITIAL IMPRESSION / ASSESSMENT AND PLAN / ED  COURSE  Jason Atkins is a 48 y.o. who presents to the emergency department for treatment and evaluation after lower extremity injury yesterday.  See HPI for further details.  Image and exam are consistent.  Patient will be treated for musculoskeletal pain and advised to keep the abrasions to the ankle clean and dry.  He is to apply antibiotic ointment to the areas a couple times a day.  For any symptoms or concern of infection, he is to either see primary care or return to the emergency department.  Medications - No data to display  Pertinent labs & imaging results that were available during my care of the patient were reviewed by me and considered in my medical decision making (see chart for details).  _________________________________________   FINAL CLINICAL IMPRESSION(S) / ED DIAGNOSES  Final diagnoses:  Hematoma of thigh, right, initial encounter  Abrasion of ankle, left, initial encounter    ED Discharge Orders    None       If controlled substance prescribed during this visit, 12 month history viewed on the [facility] prior to issuing an initial prescription for Schedule II or III opiod.   Chinita Pester, FNP 11/17/19 1725    Shaune Pollack, MD 11/18/19 1039

## 2019-11-17 NOTE — ED Notes (Signed)
See triage note  Presents with pain to both legs    States he had a jack slip and car fell on legs

## 2020-06-01 ENCOUNTER — Inpatient Hospital Stay
Admission: EM | Admit: 2020-06-01 | Discharge: 2020-06-22 | DRG: 896 | Disposition: A | Discharge status: Home / Self Care | Payer: Self-pay | Attending: Internal Medicine | Admitting: Internal Medicine

## 2020-06-01 ENCOUNTER — Other Ambulatory Visit: Payer: Self-pay

## 2020-06-01 DIAGNOSIS — E43 Unspecified severe protein-calorie malnutrition: Secondary | ICD-10-CM | POA: Diagnosis present

## 2020-06-01 DIAGNOSIS — R945 Abnormal results of liver function studies: Secondary | ICD-10-CM | POA: Diagnosis present

## 2020-06-01 DIAGNOSIS — F10220 Alcohol dependence with intoxication, uncomplicated: Secondary | ICD-10-CM | POA: Diagnosis present

## 2020-06-01 DIAGNOSIS — R0682 Tachypnea, not elsewhere classified: Secondary | ICD-10-CM | POA: Diagnosis present

## 2020-06-01 DIAGNOSIS — Z951 Presence of aortocoronary bypass graft: Secondary | ICD-10-CM

## 2020-06-01 DIAGNOSIS — Z01818 Encounter for other preprocedural examination: Secondary | ICD-10-CM

## 2020-06-01 DIAGNOSIS — Z886 Allergy status to analgesic agent status: Secondary | ICD-10-CM

## 2020-06-01 DIAGNOSIS — I252 Old myocardial infarction: Secondary | ICD-10-CM

## 2020-06-01 DIAGNOSIS — F10239 Alcohol dependence with withdrawal, unspecified: Secondary | ICD-10-CM | POA: Diagnosis present

## 2020-06-01 DIAGNOSIS — Z818 Family history of other mental and behavioral disorders: Secondary | ICD-10-CM

## 2020-06-01 DIAGNOSIS — I10 Essential (primary) hypertension: Secondary | ICD-10-CM | POA: Diagnosis present

## 2020-06-01 DIAGNOSIS — Y92009 Unspecified place in unspecified non-institutional (private) residence as the place of occurrence of the external cause: Secondary | ICD-10-CM

## 2020-06-01 DIAGNOSIS — Y907 Blood alcohol level of 200-239 mg/100 ml: Secondary | ICD-10-CM | POA: Diagnosis present

## 2020-06-01 DIAGNOSIS — R1310 Dysphagia, unspecified: Secondary | ICD-10-CM | POA: Diagnosis present

## 2020-06-01 DIAGNOSIS — F10231 Alcohol dependence with withdrawal delirium: Principal | ICD-10-CM | POA: Diagnosis present

## 2020-06-01 DIAGNOSIS — D539 Nutritional anemia, unspecified: Secondary | ICD-10-CM | POA: Diagnosis present

## 2020-06-01 DIAGNOSIS — Z91018 Allergy to other foods: Secondary | ICD-10-CM

## 2020-06-01 DIAGNOSIS — F1010 Alcohol abuse, uncomplicated: Secondary | ICD-10-CM | POA: Diagnosis present

## 2020-06-01 DIAGNOSIS — F332 Major depressive disorder, recurrent severe without psychotic features: Secondary | ICD-10-CM | POA: Diagnosis present

## 2020-06-01 DIAGNOSIS — F1020 Alcohol dependence, uncomplicated: Secondary | ICD-10-CM | POA: Diagnosis present

## 2020-06-01 DIAGNOSIS — F10931 Alcohol use, unspecified with withdrawal delirium: Secondary | ICD-10-CM | POA: Diagnosis present

## 2020-06-01 DIAGNOSIS — Z888 Allergy status to other drugs, medicaments and biological substances status: Secondary | ICD-10-CM

## 2020-06-01 DIAGNOSIS — D696 Thrombocytopenia, unspecified: Secondary | ICD-10-CM | POA: Diagnosis present

## 2020-06-01 DIAGNOSIS — F10920 Alcohol use, unspecified with intoxication, uncomplicated: Secondary | ICD-10-CM

## 2020-06-01 DIAGNOSIS — J690 Pneumonitis due to inhalation of food and vomit: Secondary | ICD-10-CM | POA: Diagnosis present

## 2020-06-01 DIAGNOSIS — K5900 Constipation, unspecified: Secondary | ICD-10-CM | POA: Diagnosis not present

## 2020-06-01 DIAGNOSIS — Z8249 Family history of ischemic heart disease and other diseases of the circulatory system: Secondary | ICD-10-CM

## 2020-06-01 DIAGNOSIS — R45850 Homicidal ideations: Secondary | ICD-10-CM | POA: Diagnosis present

## 2020-06-01 DIAGNOSIS — F319 Bipolar disorder, unspecified: Secondary | ICD-10-CM | POA: Diagnosis present

## 2020-06-01 DIAGNOSIS — W19XXXA Unspecified fall, initial encounter: Secondary | ICD-10-CM | POA: Diagnosis present

## 2020-06-01 DIAGNOSIS — Z720 Tobacco use: Secondary | ICD-10-CM | POA: Diagnosis present

## 2020-06-01 DIAGNOSIS — Z811 Family history of alcohol abuse and dependence: Secondary | ICD-10-CM

## 2020-06-01 DIAGNOSIS — Z20822 Contact with and (suspected) exposure to covid-19: Secondary | ICD-10-CM | POA: Diagnosis present

## 2020-06-01 DIAGNOSIS — F17210 Nicotine dependence, cigarettes, uncomplicated: Secondary | ICD-10-CM | POA: Diagnosis present

## 2020-06-01 DIAGNOSIS — Z7982 Long term (current) use of aspirin: Secondary | ICD-10-CM

## 2020-06-01 DIAGNOSIS — I2510 Atherosclerotic heart disease of native coronary artery without angina pectoris: Secondary | ICD-10-CM | POA: Diagnosis present

## 2020-06-01 DIAGNOSIS — R7989 Other specified abnormal findings of blood chemistry: Secondary | ICD-10-CM | POA: Diagnosis present

## 2020-06-01 DIAGNOSIS — R05 Cough: Secondary | ICD-10-CM

## 2020-06-01 DIAGNOSIS — G92 Toxic encephalopathy: Secondary | ICD-10-CM | POA: Diagnosis not present

## 2020-06-01 DIAGNOSIS — F1994 Other psychoactive substance use, unspecified with psychoactive substance-induced mood disorder: Secondary | ICD-10-CM | POA: Diagnosis present

## 2020-06-01 DIAGNOSIS — T424X5A Adverse effect of benzodiazepines, initial encounter: Secondary | ICD-10-CM | POA: Diagnosis not present

## 2020-06-01 DIAGNOSIS — Z4659 Encounter for fitting and adjustment of other gastrointestinal appliance and device: Secondary | ICD-10-CM

## 2020-06-01 DIAGNOSIS — N400 Enlarged prostate without lower urinary tract symptoms: Secondary | ICD-10-CM | POA: Diagnosis present

## 2020-06-01 DIAGNOSIS — R197 Diarrhea, unspecified: Secondary | ICD-10-CM | POA: Diagnosis present

## 2020-06-01 DIAGNOSIS — F411 Generalized anxiety disorder: Secondary | ICD-10-CM | POA: Diagnosis present

## 2020-06-01 DIAGNOSIS — R059 Cough, unspecified: Secondary | ICD-10-CM

## 2020-06-01 DIAGNOSIS — D6959 Other secondary thrombocytopenia: Secondary | ICD-10-CM | POA: Diagnosis present

## 2020-06-01 DIAGNOSIS — Z6820 Body mass index (BMI) 20.0-20.9, adult: Secondary | ICD-10-CM

## 2020-06-01 HISTORY — DX: Bipolar disorder, unspecified: F31.9

## 2020-06-01 HISTORY — DX: Tobacco use: Z72.0

## 2020-06-01 HISTORY — DX: Alcohol abuse, uncomplicated: F10.10

## 2020-06-01 LAB — COMPREHENSIVE METABOLIC PANEL
ALT: 50 U/L — ABNORMAL HIGH (ref 0–44)
AST: 120 U/L — ABNORMAL HIGH (ref 15–41)
Albumin: 4.7 g/dL (ref 3.5–5.0)
Alkaline Phosphatase: 75 U/L (ref 38–126)
Anion gap: 11 (ref 5–15)
BUN: <5 mg/dL — ABNORMAL LOW (ref 6–20)
CO2: 28 mmol/L (ref 22–32)
Calcium: 8.9 mg/dL (ref 8.9–10.3)
Chloride: 99 mmol/L (ref 98–111)
Creatinine, Ser: 0.6 mg/dL — ABNORMAL LOW (ref 0.61–1.24)
GFR calc Af Amer: >60 mL/min (ref >60)
GFR calc non Af Amer: >60 mL/min (ref >60)
Glucose, Bld: 85 mg/dL (ref 70–99)
Potassium: 4 mmol/L (ref 3.5–5.1)
Sodium: 138 mmol/L (ref 135–145)
Total Bilirubin: 1 mg/dL (ref 0.3–1.2)
Total Protein: 8.3 g/dL — ABNORMAL HIGH (ref 6.5–8.1)

## 2020-06-01 LAB — CBC WITH DIFFERENTIAL/PLATELET
Abs Immature Granulocytes: 0.01 10*3/uL (ref 0.00–0.07)
Basophils Absolute: 0.1 10*3/uL (ref 0.0–0.1)
Basophils Relative: 2 %
Eosinophils Absolute: 0.2 10*3/uL (ref 0.0–0.5)
Eosinophils Relative: 4 %
HCT: 39.7 % (ref 39.0–52.0)
Hemoglobin: 14.1 g/dL (ref 13.0–17.0)
Immature Granulocytes: 0 %
Lymphocytes Relative: 36 %
Lymphs Abs: 1.6 10*3/uL (ref 0.7–4.0)
MCH: 34.3 pg — ABNORMAL HIGH (ref 26.0–34.0)
MCHC: 35.5 g/dL (ref 30.0–36.0)
MCV: 96.6 fL (ref 80.0–100.0)
Monocytes Absolute: 0.6 10*3/uL (ref 0.1–1.0)
Monocytes Relative: 14 %
Neutro Abs: 1.9 10*3/uL (ref 1.7–7.7)
Neutrophils Relative %: 44 %
Platelets: 115 10*3/uL — ABNORMAL LOW (ref 150–400)
RBC: 4.11 MIL/uL — ABNORMAL LOW (ref 4.22–5.81)
RDW: 13.3 % (ref 11.5–15.5)
WBC: 4.4 10*3/uL (ref 4.0–10.5)
nRBC: 0 % (ref 0.0–0.2)

## 2020-06-01 LAB — ETHANOL: Alcohol, Ethyl (B): 229 mg/dL — ABNORMAL HIGH (ref <10)

## 2020-06-01 MED ORDER — THIAMINE HCL 100 MG PO TABS
100.0000 mg | ORAL_TABLET | Freq: Every day | ORAL | Status: DC
  Administered 2020-06-02 – 2020-06-22 (×16): 100 mg via ORAL
  Filled 2020-06-01 (×17): qty 1

## 2020-06-01 MED ORDER — THIAMINE HCL 100 MG/ML IJ SOLN
100.0000 mg | Freq: Every day | INTRAMUSCULAR | Status: DC
  Administered 2020-06-01 – 2020-06-12 (×4): 100 mg via INTRAVENOUS
  Filled 2020-06-01 (×7): qty 2

## 2020-06-01 MED ORDER — LORAZEPAM 2 MG PO TABS
0.0000 mg | ORAL_TABLET | Freq: Four times a day (QID) | ORAL | Status: AC
  Administered 2020-06-02 – 2020-06-03 (×5): 2 mg via ORAL
  Filled 2020-06-01 (×4): qty 1

## 2020-06-01 MED ORDER — LORAZEPAM 2 MG/ML IJ SOLN
0.0000 mg | Freq: Four times a day (QID) | INTRAMUSCULAR | Status: AC
  Administered 2020-06-01: 1 mg via INTRAVENOUS
  Administered 2020-06-03: 2 mg via INTRAVENOUS
  Filled 2020-06-01 (×3): qty 1
  Filled 2020-06-01: qty 2
  Filled 2020-06-01: qty 1

## 2020-06-01 MED ORDER — LORAZEPAM 2 MG PO TABS
0.0000 mg | ORAL_TABLET | Freq: Two times a day (BID) | ORAL | Status: AC
  Administered 2020-06-04: 1 mg via ORAL
  Filled 2020-06-01: qty 1

## 2020-06-01 MED ORDER — LORAZEPAM 2 MG/ML IJ SOLN
0.0000 mg | Freq: Two times a day (BID) | INTRAMUSCULAR | Status: AC
  Administered 2020-06-05: 1 mg via INTRAVENOUS
  Filled 2020-06-01: qty 1

## 2020-06-01 NOTE — ED Notes (Signed)
Tan shoes Jeans [employer] belt  [employer] nike tshirt   Belongings stay with pt and officer while waiting for room. In family room wait.  lw edt

## 2020-06-01 NOTE — ED Triage Notes (Signed)
[facility] sent from [facility].  [facility] sent pt because they report he had a large amount of alcohol today.  They also informed this RN that patient expressed HI about wife.  Pt denies SI/HI and reports he was pointing his finger when talking it was taking as threatening.

## 2020-06-01 NOTE — ED Notes (Signed)
IVC, pend psych consult 
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
## 2020-06-02 LAB — URINE DRUG SCREEN, QUALITATIVE (ARMC ONLY)
Amphetamines, Ur Screen: NOT DETECTED
Barbiturates, Ur Screen: NOT DETECTED
Benzodiazepine, Ur Scrn: NOT DETECTED
Cannabinoid 50 Ng, Ur ~~LOC~~: NOT DETECTED
Cocaine Metabolite,Ur ~~LOC~~: NOT DETECTED
MDMA (Ecstasy)Ur Screen: NOT DETECTED
Methadone Scn, Ur: NOT DETECTED
Opiate, Ur Screen: NOT DETECTED
Phencyclidine (PCP) Ur S: NOT DETECTED
Tricyclic, Ur Screen: NOT DETECTED

## 2020-06-02 LAB — SARS CORONAVIRUS 2 BY RT PCR (HOSPITAL ORDER, PERFORMED IN ~~LOC~~ HOSPITAL LAB): SARS Coronavirus 2: NEGATIVE

## 2020-06-02 MED ORDER — ASPIRIN EC 81 MG PO TBEC
81.0000 mg | DELAYED_RELEASE_TABLET | Freq: Every day | ORAL | Status: DC
  Administered 2020-06-02 – 2020-06-22 (×18): 81 mg via ORAL
  Filled 2020-06-02 (×19): qty 1

## 2020-06-02 MED ORDER — CHLORDIAZEPOXIDE HCL 10 MG PO CAPS
10.0000 mg | ORAL_CAPSULE | Freq: Four times a day (QID) | ORAL | Status: DC
  Administered 2020-06-02 – 2020-06-03 (×2): 10 mg via ORAL
  Filled 2020-06-02 (×3): qty 1

## 2020-06-02 NOTE — ED Notes (Signed)
Pt given meal tray and a cup of sprite.  

## 2020-06-02 NOTE — BH Assessment (Signed)
Assessment Note  Jason Atkins is an 49 y.o. male who presents to the ED from [facility]. Per the initial triage note, "Pt sent from [facility].  [facility] sent pt because they report he had a large amount of alcohol today.  They also informed this RN that patient expressed HI about wife.  Pt denies SI/HI and reports he was pointing his finger when talking it was taking as threatening".   Writer was able to speak with patient and patient reported, "Im here because I was pointing my finger". Patient was not forthcoming with anything else other than he and his wife get into arguments often and an endorsement that he has been in jail before. Patient reports he has been to jail in the past due to probation violation. Patient reports he will go to court soon regarding a domestic dispute with his wife. Patient reports he was court mandated to go to [facility] for a psych eval and that he completed a psych evaluation last week. Patient denies SI/HI/AH/VH as well as denies a mental health history. However, patients chart reports otherwise. According to a EMR note from [facility] in patients chart, patient has been diagnosed with Bipolar Disorder due to cycling moods. Patient endorses drinking one 12 oz beer per day but denies smoking or using any other drugs.   This case was staffed with Annice Pih, NP and Homero Fellers, MD. Patient should be reassessed in the morning.    Diagnosis: Alcohol Use D/O, BiPolar D/O  Past Medical History:  Past Medical History:  Diagnosis Date  . Coronary artery disease   . Heart attack (HCC)    x4  . Hx of CABG   . Hypertension     Past Surgical History:  Procedure Laterality Date  . CARDIAC SURGERY    . CORONARY ARTERY BYPASS GRAFT      Family History: No family history on file.  Social History:  reports that he has been smoking cigarettes. He has been smoking about 0.50 packs per day. He has never used smokeless tobacco. He reports current alcohol use. He reports that he does not use  drugs.  Additional Social History:  Alcohol / Drug Use Pain Medications: See PTA Prescriptions: See PTA Over the Counter: See PTA History of alcohol / drug use?: Yes Withdrawal Symptoms: Agitation  CIWA: CIWA-Ar BP: 127/84 Pulse Rate: 88 Nausea and Vomiting: mild nausea with no vomiting Tactile Disturbances: very mild itching, pins and needles, burning or numbness Tremor: not visible, but can be felt fingertip to fingertip Auditory Disturbances: not present Paroxysmal Sweats: no sweat visible Visual Disturbances: not present Anxiety: mildly anxious Headache, Fullness in Head: very mild Agitation: normal activity Orientation and Clouding of Sensorium: oriented and can do serial additions CIWA-Ar Total: 5 COWS:    Allergies:  Allergies  Allergen Reactions  . Almond Oil Other (See Comments)    Reaction: unknown  . Cashew Nut Oil Other (See Comments)    Reaction: unknown  . Benadryl [Diphenhydramine] Other (See Comments)    Reaction: unknown  . Ibuprofen Hives    Home Medications: (Not in a hospital admission)   OB/GYN Status:  No LMP for male patient.  General Assessment Data Location of Assessment: [facility] ED TTS Assessment: In system Is this a Tele or Face-to-Face Assessment?: Face-to-Face Is this an Initial Assessment or a Re-assessment for this encounter?: Initial Assessment Patient Accompanied by:: N/A Language Other than English: No Living Arrangements:  (private home) What gender do you identify as?: Male Marital status: Married Can  pt return to current living arrangement?: No Admission Status: Involuntary Petitioner: ED Attending     Crisis Care Plan Name of Psychiatrist:  ([facility]) Name of Therapist:  ([facility])     Risk to self with the past 6 months Suicidal Ideation: No Has patient been a risk to self within the past 6 months prior to admission? : No Suicidal Intent: No Has patient had any suicidal intent within the past 6 months prior to admission? :  No Is patient at risk for suicide?: No Suicidal Plan?: No Has patient had any suicidal plan within the past 6 months prior to admission? : No Access to Means: No Previous Attempts/Gestures: No Triggers for Past Attempts: None known Intentional Self Injurious Behavior: None Family Suicide History: No        Mental Status Report Motor Activity: Unremarkable     ADLScreening [facility] Assessment Services) Patient's cognitive ability adequate to safely complete daily activities?: Yes Patient able to express need for assistance with ADLs?: No Independently performs ADLs?: Yes (appropriate for developmental age)  Prior Inpatient Therapy Prior Inpatient Therapy: Yes     ADL Screening (condition at time of admission) Patient's cognitive ability adequate to safely complete daily activities?: Yes Is the patient deaf or have difficulty hearing?: No Does the patient have difficulty seeing, even when wearing glasses/contacts?: No Does the patient have difficulty concentrating, remembering, or making decisions?: No Patient able to express need for assistance with ADLs?: No Does the patient have difficulty dressing or bathing?: No Independently performs ADLs?: Yes (appropriate for developmental age) Does the patient have difficulty walking or climbing stairs?: No Weakness of Legs: None Weakness of Arms/Hands: None  Home Assistive Devices/Equipment Home Assistive Devices/Equipment: None  Therapy Consults (therapy consults require a physician order) PT Evaluation Needed: No OT Evalulation Needed: No SLP Evaluation Needed: No Abuse/Neglect Assessment (Assessment to be complete while patient is alone) Abuse/Neglect Assessment Can Be Completed: Yes Physical Abuse: Denies Verbal Abuse: Denies Sexual Abuse: Denies Exploitation of patient/patient's resources: Denies Self-Neglect: Denies Values / Beliefs Cultural Requests During Hospitalization: None Spiritual Requests During  Hospitalization: None Consults Spiritual Care Consult Needed: No Transition of Care Team Consult Needed: No Advance Directives (For Healthcare) Does Patient Have a Medical Advance Directive?: No          Disposition:  Disposition Initial Assessment Completed for this Encounter: Yes Patient referred to:  (reassess)  On Site Evaluation by:   Reviewed with Physician:    Willene Hatchet, MSc.,LCMHC,NCC 06/02/2020 1:03 AM

## 2020-06-02 NOTE — BH Assessment (Signed)
Writer spoke with the patient to complete an updated/reassessment.Pt was tremulous and drowsy upon interview. Patient's responses were congruent to the situation and the pt. Was oriented x4. Pt reported that he was feeling a lot better and was no longer having HI. Patient denies SI/HI and AV/H. Pt.'s disposition is pending at this time.

## 2020-06-02 NOTE — ED Notes (Signed)
Report received from [facility]. Patient care assumed. Patient/RN introduction complete. Will continue to monitor. Pt laying in bed without complaints of pain or discomfort. Pt denies any SI or HI at this time. Pt pending disposition by psychiatry.

## 2020-06-02 NOTE — ED Notes (Signed)
Patient observed with no unusual behavior or acute distress. Patient with no verbalized needs or c/o at this time.... will continue to monitor and follow up as needed. Security staff monitoring patient on camera system. Q15 min rounds performed by EDT.

## 2020-06-02 NOTE — ED Notes (Signed)
Pt brought into ED BHU via sally port and wand with metal detector for safety by [place] Security officer. Patient oriented to unit/care area: Pt informed of unit policies and procedures.  Informed that, for their safety, care areas are designed for safety and monitored by security cameras at all times. Patient verbalizes understanding, and verbal contract for safety obtained.Pt shown to their room.  

## 2020-06-02 NOTE — ED Notes (Signed)
Patient offered shower afer breakfast this morning. Patient refused and stated maybe later today.  °

## 2020-06-02 NOTE — ED Notes (Signed)
Patient observed with no unusual behavior or acute distress. Patient with no verbalized needs or c/o at this time.... will continue to monitor and follow up as needed. Security staff monitoring patient on camera system.  

## 2020-06-02 NOTE — Consult Note (Signed)
[facility] Face-to-Face Psychiatry Consult   Reason for Consult: Psychiatric Evaluation Referring Physician:  Dr. Larinda Buttery Patient Identification: Jason Atkins MRN:  [id number] Principal Diagnosis: <principal problem not specified> Diagnosis:  Active Problems:   Alcohol abuse   Substance induced mood disorder (HCC)   Hepatic encephalopathy (HCC)   Total Time spent with patient: 30 minutes  Subjective: "I am here because I was pointing my finger." Jason Atkins is a 49 y.o. male patient presented to [facility] ED from [facility] under involuntary commitment status (IVC).  Per the triage nurse note, The patient was sent to the ER from [facility]  because they reported he had a large amount of alcohol today.  BAL 212 mg/ dl, and they also informed this RN that the patient expressed HI about his wife.  Pt denies SI/HI and reports he was pointing his finger when talking, and it was taking as threatening. The patient was seen face-to-face by this provider; the chart was reviewed and consulted with Dr. Larinda Buttery on 06/02/2020 due to the patient's care. It was discussed with the EDP that the patient remained under observation overnight and will be reassessed in the a.m. to determine if he meets the criteria for psychiatric inpatient admission; he could be discharged back home. On evaluation, the patient is alert and oriented x 4, calm and cooperative, and mood-congruent with affect. The patient does not appear to be responding to internal or external stimuli. Neither is the patient presenting with any delusional thinking. The patient denies auditory or visual hallucinations. The patient denies any suicidal, homicidal, or self-harm ideations. The patient is not presenting with any psychotic or paranoid behaviors. During an encounter with the patient, he was able to answer questions appropriately. Plan: The patient remained under observation overnight and will be reassessed in the a.m. to determine if he meets the criteria for psychiatric  inpatient admission; he could be discharged back home.  HPI: Per Dr. Larinda Buttery: Jason Atkins is a 49 y.o. male with past medical history of CAD status post CABG, hypertension, and alcohol abuse who presents to the ED for psychiatric evaluation.  Patient reports that he drank about 6 beers earlier today and was pointing towards his wife, which she took as threatening.  She had called the police, who brought the patient to [facility] for psychiatric evaluation.  While there, patient made homicidal statements towards his wife including "I am going to choke you out."  He denies making any threatening statements, denies any suicidal ideation.  He denies any medical complaints at this time, admits to drinking alcohol on a daily basis.  Past Psychiatric History: No pertinent past psychiatric history  Risk to Self: Suicidal Ideation: No Suicidal Intent: No Is patient at risk for suicide?: No Suicidal Plan?: No Access to Means: No Triggers for Past Attempts: None known Intentional Self Injurious Behavior: None Risk to Others:   Prior Inpatient Therapy: Prior Inpatient Therapy: Yes Prior Outpatient Therapy:    Past Medical History:  Past Medical History:  Diagnosis Date  . Coronary artery disease   . Heart attack (HCC)    x4  . Hx of CABG   . Hypertension     Past Surgical History:  Procedure Laterality Date  . CARDIAC SURGERY    . CORONARY ARTERY BYPASS GRAFT     Family History: No family history on file. Family Psychiatric  History:  Social History:  Social History   Substance and Sexual Activity  Alcohol Use Yes   Comment: 2 40s per day  Social History   Substance and Sexual Activity  Drug Use No    Social History   Socioeconomic History  . Marital status: Single    Spouse name: Not on file  . Number of children: Not on file  . Years of education: Not on file  . Highest education level: Not on file  Occupational History  . Not on file  Tobacco Use  . Smoking status: Current  Every Day Smoker    Packs/day: 0.50    Types: Cigarettes  . Smokeless tobacco: Never Used  Substance and Sexual Activity  . Alcohol use: Yes    Comment: 2 40s per day  . Drug use: No  . Sexual activity: Not on file  Other Topics Concern  . Not on file  Social History Narrative  . Not on file   Social Determinants of Health   Financial Resource Strain:   . Difficulty of Paying Living Expenses:   Food Insecurity:   . Worried About Programme researcher, broadcasting/film/videounning Out of Food in the Last Year:   . Baristaan Out of Food in the Last Year:   Transportation Needs:   . Freight forwarderLack of Transportation (Medical):   Marland Kitchen. Lack of Transportation (Non-Medical):   Physical Activity:   . Days of Exercise per Week:   . Minutes of Exercise per Session:   Stress:   . Feeling of Stress :   Social Connections:   . Frequency of Communication with Friends and Family:   . Frequency of Social Gatherings with Friends and Family:   . Attends Religious Services:   . Active Member of Clubs or Organizations:   . Attends BankerClub or Organization Meetings:   Marland Kitchen. Marital Status:    Additional Social History:    Allergies:   Allergies  Allergen Reactions  . Almond Oil Other (See Comments)    Reaction: unknown  . Cashew Nut Oil Other (See Comments)    Reaction: unknown  . Benadryl [Diphenhydramine] Other (See Comments)    Reaction: unknown  . Ibuprofen Hives    Labs:  Results for orders placed or performed during the hospital encounter of 06/01/20 (from the past 48 hour(s))  Comprehensive metabolic panel     Status: Abnormal   Collection Time: 06/01/20  7:23 PM  Result Value Ref Range   Sodium 138 135 - 145 mmol/L   Potassium 4.0 3.5 - 5.1 mmol/L   Chloride 99 98 - 111 mmol/L   CO2 28 22 - 32 mmol/L   Glucose, Bld 85 70 - 99 mg/dL    Comment: Glucose reference range applies only to samples taken after fasting for at least 8 hours.   BUN <5 (L) 6 - 20 mg/dL   Creatinine, Ser 1.610.60 (L) 0.61 - 1.24 mg/dL   Calcium 8.9 8.9 - 09.610.3 mg/dL    Total Protein 8.3 (H) 6.5 - 8.1 g/dL   Albumin 4.7 3.5 - 5.0 g/dL   AST 045120 (H) 15 - 41 U/L   ALT 50 (H) 0 - 44 U/L   Alkaline Phosphatase 75 38 - 126 U/L   Total Bilirubin 1.0 0.3 - 1.2 mg/dL   GFR calc non Af Amer >60 >60 mL/min   GFR calc Af Amer >60 >60 mL/min   Anion gap 11 5 - 15    Comment: Performed at [facility], [address] [place], [place] [address]  CBC with Differential     Status: Abnormal   Collection Time: 06/01/20  7:23 PM  Result Value Ref Range  WBC 4.4 4.0 - 10.5 K/uL   RBC 4.11 (L) 4.22 - 5.81 MIL/uL   Hemoglobin 14.1 13.0 - 17.0 g/dL   HCT 42.5 39 - 52 %   MCV 96.6 80.0 - 100.0 fL   MCH 34.3 (H) 26.0 - 34.0 pg   MCHC 35.5 30.0 - 36.0 g/dL   RDW 95.6 38.7 - 56.4 %   Platelets 115 (L) 150 - 400 K/uL    Comment: Immature Platelet Fraction may be clinically indicated, consider ordering this additional test [id number]    nRBC 0.0 0.0 - 0.2 %   Neutrophils Relative % 44 %   Neutro Abs 1.9 1.7 - 7.7 K/uL   Lymphocytes Relative 36 %   Lymphs Abs 1.6 0.7 - 4.0 K/uL   Monocytes Relative 14 %   Monocytes Absolute 0.6 0 - 1 K/uL   Eosinophils Relative 4 %   Eosinophils Absolute 0.2 0 - 0 K/uL   Basophils Relative 2 %   Basophils Absolute 0.1 0 - 0 K/uL   Immature Granulocytes 0 %   Abs Immature Granulocytes 0.01 0.00 - 0.07 K/uL    Comment: Performed at [facility], [address] [place], [place] [address]  Ethanol     Status: Abnormal   Collection Time: 06/01/20  7:23 PM  Result Value Ref Range   Alcohol, Ethyl (B) 229 (H) <10 mg/dL    Comment: (NOTE) Lowest detectable limit for serum alcohol is 10 mg/dL.  For medical purposes only. Performed at [facility], [address] [place], [place] [address]   Urine Drug Screen, Qualitative     Status: None   Collection Time: 06/01/20  7:23 PM  Result Value Ref Range   Tricyclic, Ur Screen NONE DETECTED NONE DETECTED   Amphetamines, Ur Screen NONE DETECTED NONE DETECTED    MDMA (Ecstasy)Ur Screen NONE DETECTED NONE DETECTED   Cocaine Metabolite,Ur [place] NONE DETECTED NONE DETECTED   Opiate, Ur Screen NONE DETECTED NONE DETECTED   Phencyclidine (PCP) Ur S NONE DETECTED NONE DETECTED   Cannabinoid 50 Ng, Ur [place] NONE DETECTED NONE DETECTED   Barbiturates, Ur Screen NONE DETECTED NONE DETECTED   Benzodiazepine, Ur Scrn NONE DETECTED NONE DETECTED   Methadone Scn, Ur NONE DETECTED NONE DETECTED    Comment: (NOTE) Tricyclics + metabolites, urine    Cutoff 1000 ng/mL Amphetamines + metabolites, urine  Cutoff 1000 ng/mL MDMA (Ecstasy), urine              Cutoff 500 ng/mL Cocaine Metabolite, urine          Cutoff 300 ng/mL Opiate + metabolites, urine        Cutoff 300 ng/mL Phencyclidine (PCP), urine         Cutoff 25 ng/mL Cannabinoid, urine                 Cutoff 50 ng/mL Barbiturates + metabolites, urine  Cutoff 200 ng/mL Benzodiazepine, urine              Cutoff 200 ng/mL Methadone, urine                   Cutoff 300 ng/mL  The urine drug screen provides only a preliminary, unconfirmed analytical test result and should not be used for non-medical purposes. Clinical consideration and professional judgment should be applied to any positive drug screen result due to possible interfering substances. A more specific alternate chemical method must be used in order to obtain a confirmed analytical result. Gas  chromatography / mass spectrometry (GC/MS) is the preferred confirm atory method. Performed at [facility], [address] [place], [place] [address]   SARS Coronavirus 2 by RT PCR (hospital order, performed in [facility] hospital lab) Nasopharyngeal Nasopharyngeal Swab     Status: None   Collection Time: 06/02/20  2:29 AM   Specimen: Nasopharyngeal Swab  Result Value Ref Range   SARS Coronavirus 2 NEGATIVE NEGATIVE    Comment: (NOTE) SARS-CoV-2 target nucleic acids are NOT DETECTED.  The SARS-CoV-2 RNA is generally detectable in upper and  lower respiratory specimens during the acute phase of infection. The lowest concentration of SARS-CoV-2 viral copies this assay can detect is 250 copies / mL. A negative result does not preclude SARS-CoV-2 infection and should not be used as the sole basis for treatment or other patient management decisions.  A negative result may occur with improper specimen collection / handling, submission of specimen other than nasopharyngeal swab, presence of viral mutation(s) within the areas targeted by this assay, and inadequate number of viral copies (<250 copies / mL). A negative result must be combined with clinical observations, patient history, and epidemiological information.  Fact Sheet for Patients:   BoilerBrush.com.cy  Fact Sheet for Healthcare Providers: https://pope.com/  This test is not yet approved or  cleared by the [place] FDA and has been authorized for detection and/or diagnosis of SARS-CoV-2 by FDA under an Emergency Use Authorization (EUA).  This EUA will remain in effect (meaning this test can be used) for the duration of the COVID-19 declaration under Section 564(b)(1) of the Act, 21 U.S.C. section 360bbb-3(b)(1), unless the authorization is terminated or revoked sooner.  Performed at [facility], [address] [place], [place] [address]     Current Facility-Administered Medications  Medication Dose Route Frequency Provider Last Rate Last Admin  . LORazepam (ATIVAN) injection 0-4 mg  0-4 mg Intravenous Q6H Chesley Noon, MD   1 mg at 06/01/20 1931   Or  . LORazepam (ATIVAN) tablet 0-4 mg  0-4 mg Oral Q6H Chesley Noon, MD      . Melene Muller ON 06/04/2020] LORazepam (ATIVAN) injection 0-4 mg  0-4 mg Intravenous Q12H Chesley Noon, MD       Or  . Melene Muller ON 06/04/2020] LORazepam (ATIVAN) tablet 0-4 mg  0-4 mg Oral Q12H Chesley Noon, MD      . thiamine tablet 100 mg  100 mg Oral Daily Chesley Noon,  MD       Or  . thiamine (B-1) injection 100 mg  100 mg Intravenous Daily Chesley Noon, MD   100 mg at 06/01/20 1931   Current Outpatient Medications  Medication Sig Dispense Refill  . hydrochlorothiazide (HYDRODIURIL) 25 MG tablet Take 25 mg by mouth daily.    . Multiple Vitamin (MULTIVITAMIN WITH MINERALS) TABS tablet Take 1 tablet by mouth daily.      Musculoskeletal: Strength & Muscle Tone: within normal limits Gait & Station: normal Patient leans: N/A  Psychiatric Specialty Exam: Physical Exam Psychiatric:        Attention and Perception: Attention and perception normal.        Mood and Affect: Mood is elated. Affect is inappropriate.        Speech: Speech normal.        Behavior: Behavior normal.        Thought Content: Thought content normal.        Cognition and Memory: Cognition normal.        Judgment: Judgment  normal.     Review of Systems  Psychiatric/Behavioral: The patient is nervous/anxious.     Blood pressure 127/84, pulse 88, temperature 98 F (36.7 C), temperature source Oral, resp. rate 16, height 5\' 11"  (1.803 m), weight 79.4 kg, SpO2 99 %.Body mass index is 24.41 kg/m.  General Appearance: Bizarre and Disheveled  Eye Contact:  Good  Speech:  Clear and Coherent  Volume:  Normal  Mood:  NA  Affect:  Congruent and Inappropriate  Thought Process:  Coherent  Orientation:  Full (Time, Place, and Person)  Thought Content:  Logical  Suicidal Thoughts:  No  Homicidal Thoughts:  No  Memory:  Immediate;   Good Recent;   Good Remote;   Good  Judgement:  Poor  Insight:  Lacking  Psychomotor Activity:  Normal  Concentration:  Attention Span: Good  Recall:  Good  Fund of Knowledge:  Good  Language:  Good  Akathisia:  Negative  Handed:  Right  AIMS (if indicated):     Assets:  Communication Skills Desire for Improvement Physical Health Social Support  ADL's:  Intact  Cognition:  WNL  Sleep:        Treatment Plan Summary: Daily contact with  patient to assess and evaluate symptoms and progress in treatment and Plan The patient will remain under observation overnight and reassess in the a.m. to determine if he meets criteria for psychiatric inpatient admission or he could be discharged home.  Disposition: Supportive therapy provided about ongoing stressors. The patient will remain under observation overnight and reassess in the a.m. to determine if he meets criteria for psychiatric inpatient admission or he could be discharged back home.  , NP 06/02/2020 3:41 AM

## 2020-06-02 NOTE — ED Notes (Signed)
Pt given sprite, does not want snack tray at this time.

## 2020-06-03 ENCOUNTER — Observation Stay: Payer: Self-pay

## 2020-06-03 ENCOUNTER — Encounter: Payer: Self-pay | Admitting: Internal Medicine

## 2020-06-03 DIAGNOSIS — F319 Bipolar disorder, unspecified: Secondary | ICD-10-CM | POA: Diagnosis present

## 2020-06-03 DIAGNOSIS — I10 Essential (primary) hypertension: Secondary | ICD-10-CM | POA: Diagnosis present

## 2020-06-03 DIAGNOSIS — Z720 Tobacco use: Secondary | ICD-10-CM

## 2020-06-03 DIAGNOSIS — F10931 Alcohol use, unspecified with withdrawal delirium: Secondary | ICD-10-CM | POA: Diagnosis present

## 2020-06-03 DIAGNOSIS — W19XXXA Unspecified fall, initial encounter: Secondary | ICD-10-CM | POA: Diagnosis present

## 2020-06-03 DIAGNOSIS — I2510 Atherosclerotic heart disease of native coronary artery without angina pectoris: Secondary | ICD-10-CM | POA: Diagnosis present

## 2020-06-03 DIAGNOSIS — D696 Thrombocytopenia, unspecified: Secondary | ICD-10-CM | POA: Diagnosis present

## 2020-06-03 DIAGNOSIS — R45850 Homicidal ideations: Secondary | ICD-10-CM

## 2020-06-03 DIAGNOSIS — R945 Abnormal results of liver function studies: Secondary | ICD-10-CM

## 2020-06-03 DIAGNOSIS — F10239 Alcohol dependence with withdrawal, unspecified: Secondary | ICD-10-CM

## 2020-06-03 DIAGNOSIS — F10231 Alcohol dependence with withdrawal delirium: Principal | ICD-10-CM

## 2020-06-03 DIAGNOSIS — R7989 Other specified abnormal findings of blood chemistry: Secondary | ICD-10-CM | POA: Diagnosis present

## 2020-06-03 LAB — AMMONIA: Ammonia: 52 umol/L — ABNORMAL HIGH (ref 9–35)

## 2020-06-03 LAB — GLUCOSE, CAPILLARY: Glucose-Capillary: 121 mg/dL — ABNORMAL HIGH (ref 70–99)

## 2020-06-03 LAB — MRSA PCR SCREENING: MRSA by PCR: NEGATIVE

## 2020-06-03 LAB — LACTATE DEHYDROGENASE: LDH: 196 U/L — ABNORMAL HIGH (ref 98–192)

## 2020-06-03 MED ORDER — SODIUM CHLORIDE 0.9 % IV SOLN: INTRAVENOUS | Status: DC

## 2020-06-03 MED ORDER — CLONIDINE HCL 0.1 MG PO TABS
0.1000 mg | ORAL_TABLET | Freq: Two times a day (BID) | ORAL | Status: DC
  Administered 2020-06-04 – 2020-06-12 (×9): 0.1 mg via ORAL
  Filled 2020-06-03 (×10): qty 1

## 2020-06-03 MED ORDER — HALOPERIDOL 0.5 MG PO TABS
0.5000 mg | ORAL_TABLET | Freq: Three times a day (TID) | ORAL | Status: DC
  Administered 2020-06-03: 0.5 mg via ORAL
  Filled 2020-06-03 (×7): qty 1

## 2020-06-03 MED ORDER — LORAZEPAM 2 MG/ML IJ SOLN
2.0000 mg | Freq: Once | INTRAMUSCULAR | Status: AC
  Administered 2020-06-03: 2 mg via INTRAVENOUS

## 2020-06-03 MED ORDER — DEXMEDETOMIDINE HCL IN NACL 400 MCG/100ML IV SOLN
0.4000 ug/kg/h | INTRAVENOUS | Status: DC
  Administered 2020-06-03: 0.4 ug/kg/h via INTRAVENOUS
  Administered 2020-06-03: 1.2 ug/kg/h via INTRAVENOUS
  Administered 2020-06-04: 0.8 ug/kg/h via INTRAVENOUS
  Administered 2020-06-04 – 2020-06-06 (×5): 0.6 ug/kg/h via INTRAVENOUS
  Administered 2020-06-07: 0.8 ug/kg/h via INTRAVENOUS
  Administered 2020-06-07: 0.5 ug/kg/h via INTRAVENOUS
  Administered 2020-06-07: 0.6 ug/kg/h via INTRAVENOUS
  Administered 2020-06-08: 0.4 ug/kg/h via INTRAVENOUS
  Administered 2020-06-08: 0.6 ug/kg/h via INTRAVENOUS
  Administered 2020-06-09 (×3): 1.2 ug/kg/h via INTRAVENOUS
  Administered 2020-06-09: 0.6 ug/kg/h via INTRAVENOUS
  Administered 2020-06-10 – 2020-06-11 (×7): 1.2 ug/kg/h via INTRAVENOUS
  Administered 2020-06-11 – 2020-06-12 (×3): 0.8 ug/kg/h via INTRAVENOUS
  Filled 2020-06-03 (×30): qty 100

## 2020-06-03 MED ORDER — CHLORDIAZEPOXIDE HCL 25 MG PO CAPS
25.0000 mg | ORAL_CAPSULE | Freq: Four times a day (QID) | ORAL | Status: DC
  Administered 2020-06-03: 25 mg via ORAL
  Filled 2020-06-03: qty 1

## 2020-06-03 MED ORDER — CHLORDIAZEPOXIDE HCL 25 MG PO CAPS
25.0000 mg | ORAL_CAPSULE | Freq: Once | ORAL | Status: AC
  Administered 2020-06-03: 25 mg via ORAL
  Filled 2020-06-03: qty 1

## 2020-06-03 MED ORDER — ONDANSETRON HCL 4 MG/2ML IJ SOLN
4.0000 mg | Freq: Three times a day (TID) | INTRAMUSCULAR | Status: DC | PRN
  Filled 2020-06-03: qty 2

## 2020-06-03 MED ORDER — CHLORDIAZEPOXIDE HCL 10 MG PO CAPS
50.0000 mg | ORAL_CAPSULE | Freq: Four times a day (QID) | ORAL | Status: DC
  Administered 2020-06-04 – 2020-06-13 (×19): 50 mg via ORAL
  Filled 2020-06-03 (×4): qty 2
  Filled 2020-06-03: qty 5
  Filled 2020-06-03 (×7): qty 2
  Filled 2020-06-03: qty 5
  Filled 2020-06-03 (×2): qty 2
  Filled 2020-06-03: qty 5
  Filled 2020-06-03 (×5): qty 2

## 2020-06-03 MED ORDER — SODIUM CHLORIDE 0.9 % IV BOLUS
1000.0000 mL | Freq: Once | INTRAVENOUS | Status: AC
  Administered 2020-06-03: 1000 mL via INTRAVENOUS

## 2020-06-03 MED ORDER — HALOPERIDOL LACTATE 5 MG/ML IJ SOLN
INTRAMUSCULAR | Status: AC
  Administered 2020-06-03: 5 mg via INTRAMUSCULAR
  Filled 2020-06-03: qty 1

## 2020-06-03 MED ORDER — CHLORHEXIDINE GLUCONATE CLOTH 2 % EX PADS
6.0000 | MEDICATED_PAD | Freq: Every day | CUTANEOUS | Status: DC
  Administered 2020-06-03 – 2020-06-14 (×9): 6 via TOPICAL

## 2020-06-03 MED ORDER — HALOPERIDOL LACTATE 5 MG/ML IJ SOLN: 5.0000 mg | Freq: Once | INTRAMUSCULAR | Status: AC

## 2020-06-03 MED ORDER — LORAZEPAM 2 MG/ML IJ SOLN
4.0000 mg | Freq: Once | INTRAMUSCULAR | Status: AC
  Administered 2020-06-03: 4 mg via INTRAMUSCULAR

## 2020-06-03 MED ORDER — NICOTINE 21 MG/24HR TD PT24
21.0000 mg | MEDICATED_PATCH | Freq: Every day | TRANSDERMAL | Status: DC
  Administered 2020-06-03 – 2020-06-22 (×20): 21 mg via TRANSDERMAL
  Filled 2020-06-03 (×19): qty 1

## 2020-06-03 MED ORDER — HYDRALAZINE HCL 20 MG/ML IJ SOLN
5.0000 mg | INTRAMUSCULAR | Status: DC | PRN
  Filled 2020-06-03: qty 0.25

## 2020-06-03 MED ORDER — OLANZAPINE 10 MG PO TBDP
10.0000 mg | ORAL_TABLET | Freq: Two times a day (BID) | ORAL | Status: DC
  Administered 2020-06-04 – 2020-06-16 (×18): 10 mg via ORAL
  Filled 2020-06-03 (×28): qty 1

## 2020-06-03 MED ORDER — CLONIDINE HCL 0.1 MG PO TABS
0.1000 mg | ORAL_TABLET | Freq: Every day | ORAL | Status: DC
  Administered 2020-06-03: 0.1 mg via ORAL
  Filled 2020-06-03: qty 1

## 2020-06-03 MED ORDER — ENOXAPARIN SODIUM 40 MG/0.4ML ~~LOC~~ SOLN
40.0000 mg | SUBCUTANEOUS | Status: DC
  Administered 2020-06-03 – 2020-06-22 (×19): 40 mg via SUBCUTANEOUS
  Filled 2020-06-03 (×19): qty 0.4

## 2020-06-03 MED ORDER — BENZTROPINE MESYLATE 0.5 MG PO TABS
0.5000 mg | ORAL_TABLET | Freq: Two times a day (BID) | ORAL | Status: DC
  Administered 2020-06-04 – 2020-06-22 (×31): 0.5 mg via ORAL
  Filled 2020-06-03 (×40): qty 1

## 2020-06-03 MED ORDER — HALOPERIDOL LACTATE 5 MG/ML IJ SOLN: 1.0000 mg | Freq: Once | INTRAMUSCULAR | Status: DC

## 2020-06-03 NOTE — ED Notes (Signed)
Patient laying in bed watching TV. Psych advised of patient high ciwa score and patient behavior.

## 2020-06-03 NOTE — BH Assessment (Signed)
Writer spoke with the patient to complete an updated/reassessment. Patient denies SI/HI and AV/H. However, is having moments when he is disoriented and became confused during the conversation.

## 2020-06-03 NOTE — ED Notes (Signed)
Patient transported to CT, officer present.

## 2020-06-03 NOTE — ED Notes (Signed)
medicine at bedside to consult for medical admission due to DT's.

## 2020-06-03 NOTE — ED Notes (Signed)
Pt continuously trying to get out of bed , pt has unhooked his IV 2x and pulled off all of his monitors. RN Amy and this wrtier at pt bedside in attempts to keep pt in bed and safe, pt refusing to stay in bed and wants to go get his sister that he sts is in the lobby. Pt continuously talking about talking to his sister and wanting to go back "to [place]" with her. This Clinical research associate explained to pt that his sister isn't here at the hospital with him. MD Roxan Hockey & Charge RN Tammy Sours made aware.

## 2020-06-03 NOTE — ED Notes (Signed)
Dr Clyde Lundborg in with pt, restraint order placedd, restraints applied by this rn and greg rn.  Sitter with pt.

## 2020-06-03 NOTE — Final Progress Note (Signed)
Physician Final Progress Note  Patient ID: Jason Atkins MRN: [id number] DOB/AGE: [birth year] 48 y.o.  Admit date: 06/01/2020 Admitting provider: Lorretta Harp, MD Discharge date: 06/03/2020   Admission Diagnoses:   ETOH dependence  Major depression severe recurrent  Discharge Diagnoses:  Principal Problem:   Alcohol withdrawal (HCC) Active Problems:   Alcohol abuse   Substance induced mood disorder (HCC)   Hepatic encephalopathy (HCC)   Hypertension   Coronary artery disease   Homicidal ideation   Thrombocytopenia (HCC)   Tobacco abuse   Abnormal LFTs    Consults:   IM MD  ER MD Psych MD TTS Significant Findings/ Diagnostic Studies:  UDS CBC Chem  Procedures:  CIWA  Discharge Condition: fair to poor Transferred to IM floor for medical detox  Disposition: see above   Diet: Regular diet  Discharge Activity:  IM protocol on floor   Patient admitted to medicine after attempt at CIWA protocol alone. Has symptoms of clouded consciousness, fluctuant, disorientation, mild hallucinations, continued shakes and tremors  Had been on qid Librium and this am low dose haldol and clonidine.   BP has been in 100 range of diastolic and systolic remains elevated with excessive shakes tremors.  Oriented to name and part of place  Has some visual hallucinations and confusion No frank SI or plans but remains on IVC  Transferred to medicine ---for further stabilization than can be sent to Psych for further dual diagnosis treatment and support      Total time spent taking care of this patient:  30-40 minutes  Signed: Roselind Messier 06/03/2020, 11:57 AM

## 2020-06-03 NOTE — ED Provider Notes (Signed)
-----------------------------------------   5:54 PM on 06/03/2020 -----------------------------------------  This patient is admitted to the hospital service for alcohol withdrawal.  Since my shift began at 3 PM, he has had recurrent episodes of agitation, getting up from the bed, making various demands, and appears to be hallucinating things such as his wife standing outside the room or the refrigerator in the room that he wants food from.  He is combative and resistant, and appears disoriented.  Symptoms are consistent with alcohol withdrawal delirium.  He has so far received 3 doses of Ativan 2 mg by IV with minimal response, and is again agitated and delirious which caused him to stand up and pull out the IV.  I have ordered 4 mg Ativan IM.  We will alert the hospitalist.   Dionne Bucy, MD 06/03/20 2242

## 2020-06-03 NOTE — ED Notes (Signed)
Report off to mitch rn 

## 2020-06-03 NOTE — ED Notes (Signed)
Patient out of bed  With steady gait to nursing station "asking what are we cooking". Patient advised that breakfast will be up about 9-930. Patient back to his room.

## 2020-06-03 NOTE — ED Notes (Signed)
Resumed care from collyn rn.  meds given.  Pt confused and pulling at iv.  Iv fluids infusing.

## 2020-06-03 NOTE — ED Notes (Signed)
Patient to nurses station requesting his clothes. Patient offered a shower. Shower supplies and oral hygiene provided. Patient showered and changed clothes. Requesting phone. Unable to dial his numbers due to tremors. Writer dialed daughters number for him. Patient on phone with daughter Jason Atkins.

## 2020-06-03 NOTE — ED Notes (Signed)
Daughter Porfirio Mylar called spoke with patient on phone. # [phone]

## 2020-06-03 NOTE — ED Notes (Signed)
Pt walking around, asking to use the phone to call wife. Informed patient of telephone hours and redirected to room.

## 2020-06-03 NOTE — ED Notes (Signed)
Sitter with pt  meds given.  Pt up to bathroom with assistance.

## 2020-06-03 NOTE — ED Notes (Signed)
Pt broke iv tubing, got out of bed with sitter at bedside.  md with pt and pt redirected to bed.  Iv intact.  Iv ativan given per md verbal order.

## 2020-06-03 NOTE — Progress Notes (Signed)
Pt received from ER via stretcher to CCU  # 17. Confused, agitated , unable to follow commands with bilateral wrist restraint on. Precedex gtt @ 1.40mcg/hr infusing. Sitter needed. Will continue to monitor pt closely.

## 2020-06-03 NOTE — ED Notes (Signed)
Patient voided in urinal

## 2020-06-03 NOTE — ED Notes (Signed)
Pt resting in bed with no issues at this time. Alert and oriented to self and DOB only at this time. Speech slurred at times, however ansering most questions appropriately. Room across from nurse station for safety, pt told to stay in bed. Pt verbalized understanding, call light in reach. No distress noted at this time.

## 2020-06-03 NOTE — ED Provider Notes (Addendum)
Emergency Medicine Observation Re-evaluation Note  Jason Atkins is a 49 y.o. male, seen on rounds today.  Pt initially presented to the ED for complaints of Psychiatric Evaluation Currently, the patient is asking to call his spouse.  Physical Exam  BP (!) 162/101   Pulse (!) 110   Temp 98.4 F (36.9 C) (Oral)   Resp 20   Ht 5\' 11"  (1.803 m)   Wt 79.4 kg   SpO2 98%   BMI 24.41 kg/m  Physical Exam   Gen: Calm, in NAD,ambulating about bhu HEENT: NCAT CV: Well perfused Resp: Normal work of breathing, no apparent resp distress Neuro: Non-focal, no apparent deficits, at mental baseline Psych: Calm, cooperative  ED Course / MDM  EKG:    I have reviewed the labs performed to date as well as medications administered while in observation.  Recent changes in the last 24 hours include none. Plan  Current plan is for psych dispo. Patient is under full IVC at this time.   , MD 06/03/20 [phone]   Patient having increasing CIWA scores on multiple medications.  Discussed with psych patient will require admission for alcohol withdrawal prior to admission to inpatient psych.  Have increased his librium.   [id number], MD 06/03/20 1125    06/05/20, MD 06/03/20 1147

## 2020-06-03 NOTE — ED Notes (Signed)
Pt moving about, pulled iv out,.  md aware..  meds given im per dr Marisa Severin

## 2020-06-03 NOTE — ED Notes (Signed)
Pt having visual hallucinations, pt seeing housekeeper and believing that it is "his homeboyPatent examiner is a male

## 2020-06-03 NOTE — Progress Notes (Signed)
Patient ID: Jason Atkins, male   DOB: 05-06-1971, 49 y.o.   MRN: [id number]    Psychiatry Brief regular ER note  Patient 's  DT symptoms escalating where he was wandering away actively hallucinating actively picking in the air.  Received IM Ativan 2mg  just now along with a dose increase of   Librium 50 mg po qid from 25 Zyprexa 10 bid also added regular oral dosing  Along with Reg low dose haldol   Both needed for now to contain active DT issues  IM following for medical side of DT's as he was transferred from [facility] for increasing BP / shakes and tremors, above clouded and fluctuant consciousness and frank hallucinations and mood swings.   He is oriented to name Right now cooperative with new IV's Concentration and attention poor  No active SI or HI for now   New labs pending   Patient should calm with this regimen, otherwise will reassess if needed with IM   Rama [facility] MD

## 2020-06-03 NOTE — ED Notes (Signed)
Pt awake watching tv, calm and cooperative.

## 2020-06-03 NOTE — ED Notes (Signed)
Patient up early walking around in day room, reports feeling better. Patient states ready to go home. When speaking appears to be having loose association with his conversations and needing to be reoriented. Patient oriented to place and time. Patient pleasant with hand and tongue  Tremors noted. CIWA 14. meds given as per pertocol. Will continue to monitor.

## 2020-06-03 NOTE — ED Notes (Signed)
Pt came up to the door stating "I am ready to go home". Pt states he feels better and states "I have bills to pay I need to go home". Annice Pih NP made aware, explained to patient he had to be evaluated by psychiatry in the am regarding discharge. Pt back to room to watch tv.

## 2020-06-03 NOTE — ED Notes (Signed)
Patient resting comfortably in room. No complaints or concerns voiced. No distress or abnormal behavior noted. Will continue to monitor with security cameras. Q 15 minute rounds continue. 

## 2020-06-03 NOTE — H&P (Addendum)
History and Physical    [name] EHM:[id number] DOB: 1971-05-07 DOA: 06/01/2020  Referring MD/NP/PA:   [facility]P: [facility]   Patient coming from:  The patient is coming from home.  At baseline, pt is independent for most of ADL.        Chief Complaint: Alcohol withdrawal, homicidal ideation  HPI: Jason Atkins is a 49 y.o. male with medical history significant of hypertension, CAD, CABG, alcohol abuse, tobacco abuse, hepatic encephalopathy, who presents with alcohol withdrawal, homicidal ideations.  Per his wife, patient has been heavily drinking, approximately 25-30 beers each time.  He has decreased oral intake recently.  He has been intermittently confused.  He fell at Sunday morning, no loss of consciousness.  Not sure if he injured his head.  No unilateral numbness or tingling in his extremities. No facial droop or slurred speech. Patient has intermittent diarrhea in the past, but no diarrhea in the past several days.  Patient denies nausea, vomiting, abdominal pain.  Patient denies chest pain, shortness of breath, cough.  No fever or chills. Per EDP, pt reportedly had homicidal ideation, but the patient denies suicidal or homicidal ideations now.  ED Course: pt was found to have WBC 4.4, positive UDS for benzo, negative Covid PCR, alcohol level 229, electrolytes renal function okay, abnormal liver function (ALP 75, AST 120, ALT 50, total bilirubin 1.0), thrombocytopenia with platelets of 115, temperature normal, blood pressure 162/101, tachycardia, tachypnea, oxygen saturation 98% on room air. Patient is placed on med surge bed for observation. Psychiatry, Dr. Smith Robert is consulted. Pt is IVC'ed.   Review of Systems:   General: no fevers, chills, no body weight gain, has poor appetite, has fatigue HEENT: no blurry vision, hearing changes or sore throat Respiratory: no dyspnea, coughing, wheezing CV: no chest pain, no palpitations GI: no nausea, vomiting, abdominal pain,  diarrhea, constipation GU: no dysuria, burning on urination, increased urinary frequency, hematuria  Ext: no leg edema Neuro: no unilateral weakness, numbness, or tingling, no vision change or hearing loss. Had fall. Skin: no rash, no skin tear. MSK: No muscle spasm, no deformity, no limitation of range of movement in spin Heme: No easy bruising.  Travel history: No recent long distant travel. Psychiatry: Had homicidal ideation, but denies any suicidal or homicidal ideations currently  Allergy:  Allergies  Allergen Reactions  . Almond Oil Other (See Comments)    Reaction: unknown  . Cashew Nut Oil Other (See Comments)    Reaction: unknown  . Benadryl [Diphenhydramine] Other (See Comments)    Reaction: unknown  . Ibuprofen Hives    Past Medical History:  Diagnosis Date  . Alcohol abuse   . Bipolar disorder (HCC)   . Coronary artery disease   . Heart attack (HCC)    x4  . Hx of CABG   . Hypertension   . Tobacco abuse     Past Surgical History:  Procedure Laterality Date  . CARDIAC SURGERY    . CORONARY ARTERY BYPASS GRAFT      Social History:  reports that he has been smoking cigarettes. He has been smoking about 0.50 packs per day. He has never used smokeless tobacco. He reports current alcohol use. He reports that he does not use drugs.  Family History:  Family History  Problem Relation Age of Onset  . Diabetes Mellitus II Father   . Heart attack Father      Prior to Admission medications   Medication Sig Start Date End Date Taking? Authorizing  Provider  aspirin EC 81 MG tablet Take 81 mg by mouth daily. Swallow whole.   Yes [provider]  hydrochlorothiazide (HYDRODIURIL) 25 MG tablet Take 25 mg by mouth daily. Patient not taking: Reported on 06/02/2020    [provider]  Multiple Vitamin (MULTIVITAMIN WITH MINERALS) TABS tablet Take 1 tablet by mouth daily. Patient not taking: Reported on 06/02/2020    [provider]    Physical  Exam: Vitals:   06/02/20 2045 06/02/20 2055 06/03/20 0909 06/03/20 0910  BP: (!) 146/100 (!) 146/100 (!) 162/101 (!) 162/101  Pulse: (!) 106 (!) 106 (!) 110 (!) 110  Resp: 19  20   Temp: 98.6 F (37 C)  98.4 F (36.9 C)   TempSrc: Oral  Oral   SpO2: 98%  98%   Weight:      Height:       General: Not in acute distress.  Patient is tremulous HEENT:       Eyes: PERRL, EOMI, no scleral icterus.       ENT: No discharge from the ears and nose, no pharynx injection, no tonsillar enlargement.        Neck: No JVD, no bruit, no mass felt. Heme: No neck lymph node enlargement. Cardiac: S1/S2, RRR, No murmurs, No gallops or rubs. Respiratory:  No rales, wheezing, rhonchi or rubs. GI: Soft, nondistended, nontender, no rebound pain, no organomegaly, BS present. GU: No hematuria Ext: No pitting leg edema bilaterally. 2+DP/PT pulse bilaterally. Musculoskeletal: No joint deformities, No joint redness or warmth, no limitation of ROM in spin. Skin: No rashes.  Neuro: Alert, oriented X3, cranial nerves II-XII grossly intact, moves all extremities normally. Psych: Currently denies suicidal or hemocidal ideation.  Labs on Admission: I have personally reviewed following labs and imaging studies  CBC: Recent Labs  Lab 06/01/20 1923  WBC 4.4  NEUTROABS 1.9  HGB 14.1  HCT 39.7  MCV 96.6  PLT 115*   Basic Metabolic Panel: Recent Labs  Lab 06/01/20 1923  NA 138  K 4.0  CL 99  CO2 28  GLUCOSE 85  BUN <5*  CREATININE 0.60*  CALCIUM 8.9   GFR: Estimated Creatinine Clearance: 120.3 mL/min (A) (by C-G formula based on SCr of 0.6 mg/dL (L)). Liver Function Tests: Recent Labs  Lab 06/01/20 1923  AST 120*  ALT 50*  ALKPHOS 75  BILITOT 1.0  PROT 8.3*  ALBUMIN 4.7   No results for input(s): LIPASE, AMYLASE in the last 168 hours. No results for input(s): AMMONIA in the last 168 hours. Coagulation Profile: No results for input(s): INR, PROTIME in the last 168 hours. Cardiac  Enzymes: No results for input(s): CKTOTAL, CKMB, CKMBINDEX, TROPONINI in the last 168 hours. BNP (last 3 results) No results for input(s): PROBNP in the last 8760 hours. HbA1C: No results for input(s): HGBA1C in the last 72 hours. CBG: No results for input(s): GLUCAP in the last 168 hours. Lipid Profile: No results for input(s): CHOL, HDL, LDLCALC, TRIG, CHOLHDL, LDLDIRECT in the last 72 hours. Thyroid Function Tests: No results for input(s): TSH, T4TOTAL, FREET4, T3FREE, THYROIDAB in the last 72 hours. Anemia Panel: No results for input(s): VITAMINB12, FOLATE, FERRITIN, TIBC, IRON, RETICCTPCT in the last 72 hours. Urine analysis:    Component Value Date/Time   COLORURINE YELLOW (A) 07/28/2019 0046   APPEARANCEUR CLEAR (A) 07/28/2019 0046   APPEARANCEUR Clear 01/08/2014 1537   LABSPEC 1.016 07/28/2019 0046   LABSPEC 1.003 01/08/2014 1537   PHURINE 5.0 07/28/2019 0046  GLUCOSEU NEGATIVE 07/28/2019 0046   GLUCOSEU 50 mg/dL 40/98/119102/19/2015 [id number]   HGBUR NEGATIVE 07/28/2019 0046   BILIRUBINUR NEGATIVE 07/28/2019 0046   BILIRUBINUR Negative 01/08/2014 1537   KETONESUR 5 (A) 07/28/2019 0046   PROTEINUR NEGATIVE 07/28/2019 0046   NITRITE NEGATIVE 07/28/2019 0046   LEUKOCYTESUR NEGATIVE 07/28/2019 0046   LEUKOCYTESUR Negative 01/08/2014 1537   Sepsis Labs: @LABRCNTIP (procalcitonin:4,lacticidven:4) ) Recent Results (from the past 240 hour(s))  SARS Coronavirus 2 by RT PCR (hospital order, [facility]) Nasopharyngeal Nasopharyngeal Swab     Status: None   Collection Time: 06/02/20  2:29 AM   Specimen: Nasopharyngeal Swab  Result Value Ref Range Status   SARS Coronavirus 2 NEGATIVE NEGATIVE Final    Comment: (NOTE) SARS-CoV-2 target nucleic acids are NOT DETECTED.  The SARS-CoV-2 RNA is generally detectable in upper and lower respiratory specimens during the acute phase of infection. The lowest concentration of SARS-CoV-2 viral copies this assay can detect is  250 copies / mL. A negative result does not preclude SARS-CoV-2 infection and should not be used as the sole basis for treatment or other patient management decisions.  A negative result may occur with improper specimen collection / handling, submission of specimen other than nasopharyngeal swab, presence of viral mutation(s) within the areas targeted by this assay, and inadequate number of viral copies (<250 copies / mL). A negative result must be combined with clinical observations, patient history, and epidemiological information.  Fact Sheet for Patients:   BoilerBrush.com.cyhttps://www.fda.gov/media/136312/download  Fact Sheet for Healthcare Providers: https://pope.com/https://www.fda.gov/media/136313/download  This test is not yet approved or  cleared by the [place] FDA and has been authorized for detection and/or diagnosis of SARS-CoV-2 by FDA under an Emergency Use Authorization (EUA).  This EUA will remain in effect (meaning this test can be used) for the duration of the COVID-19 declaration under Section 564(b)(1) of the Act, 21 U.S.C. section 360bbb-3(b)(1), unless the authorization is terminated or revoked sooner.  Performed at [facility], [address] [place], [place] [address]      Radiological Exams on Admission: No results found.   EKG:  Not done in ED, will get one.   Assessment/Plan Principal Problem:   Alcohol withdrawal (HCC) Active Problems:   Alcohol abuse   Hypertension   Coronary artery disease   Homicidal ideation   Thrombocytopenia (HCC)   Tobacco abuse   Abnormal LFTs   Fall   Bipolar disorder (HCC)   Alcohol withdrawal (HCC): CIWA score 10-14 in ED. patient is tremulous, no seizure.  -will place on med-surg bed for obs -CIWA protocol -Librium 10 mg 4 times a day -Use clonidine 0.1 mg twice daily for hypertension -IVF: 1L NS, then 100 cc/h   Addendum: pt is more confused in the afternoon, pulling out IV, agitation, with hallucination. Have to  give 2 mg of Ativan x3 and 5 mg of haldol, no significant improvement -consulted ICU, Dr. Belia HemanKasa -Start soft restraint to avoid injury  Hx of bipolar disorder and homicidal ideation: Psychiatry, Dr. Smith Robertao is consulted.  Recommended to treat patient for alcohol withdrawal first, then admit to behavioral unit. -Precautions homicidal and suicidal -Haldol 0.5 3 times daily  Alcohol abuse and tobacco abuse -nicotine patch and CiWA   Hypertension: -IV hydralazine as needed -Hold HCTZ since patient need IV fluid -Clonidine 0.1 mg twice daily  Coronary artery disease: s/p of CABG. No CP -continue ASA  Thrombocytopenia (HCC): Likely due to alcohol abuse.  No bleeding tendency -Check LDH and  peripheral smear  Abnormal LFTs: likely due to alcohol abuse -Check HIV antibody and hepatitis panel -Avoid using Tylenol  Fall: Likely due to alcohol intoxication -f/u CT-head         DVT ppx:  SQ Lovenox Code Status: Full code Family Communication: not done, no family member is at bed side.              Yes, patient's wife by phone Disposition Plan:  Anticipate discharge back to previous environment Consults called:  Dr. Smith Robert of psychiatry is consulted per EDP Admission status: Med-surg bed for obs  Status is: Observation  The patient remains OBS appropriate and will d/c before 2 midnights.  Dispo: The patient is from: Home              Anticipated d/c is to: Behavioral unit              Anticipated d/c date is: 1 day              Patient currently is not medically stable to d/c.            Date of Service 06/03/2020    Lorretta Harp [facility]   If 7PM-7AM, please contact night-coverage [email or web address] 06/03/2020, 12:30 PM

## 2020-06-03 NOTE — ED Notes (Signed)
Pt trying to get up  md at bedside  meds given again. Sitter with pt

## 2020-06-03 NOTE — Plan of Care (Signed)
Pt pleasantly confused , unable to follow commands /participate in his health care.

## 2020-06-03 NOTE — ED Notes (Signed)
Dr Roxan Hockey in with pt   meds given

## 2020-06-03 NOTE — Consult Note (Signed)
Name: Jason Atkins MRN: [id number] DOB: [birth year]    ADMISSION DATE:  06/01/2020 CONSULTATION DATE:  06/03/2020  REFERRING MD :  Dr. Clyde Lundborg    CHIEF COMPLAINT:  Homicidal ideation  BRIEF PATIENT DESCRIPTION:  49 y.o. Male with PMH of alcohol abuse, was to be admitted to Med-Surg unit for ETOH withdrawal.  While waiting for bed placement in ED began to have severe ETOH withdrawals, now requiring Precedex gtt.  SIGNIFICANT EVENTS  7/13: Presented to ED for Psych evaluation; found to have Homicidal ideation; IVC 7/14: Remained in observation unit 7/15: To be admitted to Med-Surg unit by Hospitalist due to signs of early ETOH withdrawal 7/15: While awaiting bed placement in ED, with severe ETOH withdrawals 7/15: PCCM consulted, placed on Precedex  STUDIES:  7/15: CT Head wo Contrast>>No evidence of acute intracranial injury or calvarial fracture. Advanced intracranial vascular calcifications. Clinical correlation for vascular risk factors and aggressive management is recommended. Moderate parenchymal atrophy, more than would be typically expected for a patient of this age.   CULTURES: SARS-CoV-2 PCR 7/14>> negative MRSA PCR 7/15>> negative  ANTIBIOTICS: N/A  HISTORY OF PRESENT ILLNESS:   Jason Atkins is a 49 year old male past medical history significant for hypertension, coronary artery disease, CABG, alcohol abuse, tobacco abuse hepatic encephalopathy, who presents to [facility] ED on 06/01/20 for Psych evaluation.  Patient is currently altered and sedated, therefore history is obtained from ED and nursing notes  Per notes, the patient's wife reported he has been heavily drinking,  And has has been intermittently confused. He fell Sunday morning with no loss of consciousness.  Patient denied unilateral numbness or tingling, facial droop, slurred speech, nausea, vomiting, abdominal pain, chest pain, shortness of breath, cough, fever, chills.  Upon arrival to ED He was noted to have homicidal  ideations and alcohol withdrawal.  Initial work-up in ED revealed alcohol level 229, ALP 75, AST 120, ALT 50, total bilirubin 1.0, platelets 115.  Urine drug screen is positive for benzodiazepines, his COVID-19 PCR is negative.  Patient was placed on IVC in the observation unit, and psychiatry consulted.  He began to show signs of early ETOH withdrawal, therefore Hospitalist were called for placement on the MedSurg unit for observation.  While awaiting bed placement in the ED, patient noted to have signs of worsening alcohol withdrawal.  PCCM was consulted for further assistance in managing severe alcohol withdrawal.  PAST MEDICAL HISTORY :   has a past medical history of Alcohol abuse, Bipolar disorder (HCC), Coronary artery disease, Heart attack (HCC), CABG, Hypertension, and Tobacco abuse.  has a past surgical history that includes Coronary artery bypass graft and Cardiac surgery. Prior to Admission medications   Medication Sig Start Date End Date Taking? Authorizing Provider  aspirin EC 81 MG tablet Take 81 mg by mouth daily. Swallow whole.   Yes [provider]  hydrochlorothiazide (HYDRODIURIL) 25 MG tablet Take 25 mg by mouth daily. Patient not taking: Reported on 06/02/2020    [provider]  Multiple Vitamin (MULTIVITAMIN WITH MINERALS) TABS tablet Take 1 tablet by mouth daily. Patient not taking: Reported on 06/02/2020    [provider]   Allergies  Allergen Reactions  . Almond Oil Other (See Comments)    Reaction: unknown  . Cashew Nut Oil Other (See Comments)    Reaction: unknown  . Benadryl [Diphenhydramine] Other (See Comments)    Reaction: unknown  . Ibuprofen Hives    FAMILY HISTORY:  family history includes Diabetes Mellitus II in his father;  Heart attack in his father. SOCIAL HISTORY:  reports that he has been smoking cigarettes. He has been smoking about 0.50 packs per day. He has never used smokeless tobacco. He reports current alcohol use.  He reports that he does not use drugs.   COVID-19 DISASTER DECLARATION:  FULL CONTACT PHYSICAL EXAMINATION WAS NOT POSSIBLE DUE TO TREATMENT OF COVID-19 AND  CONSERVATION OF PERSONAL PROTECTIVE EQUIPMENT, LIMITED EXAM FINDINGS INCLUDE-  Patient assessed or the symptoms described in the history of present illness.  In the context of the Global COVID-19 pandemic, which necessitated consideration that the patient might be at risk for infection with the SARS-CoV-2 virus that causes COVID-19, Institutional protocols and algorithms that pertain to the evaluation of patients at risk for COVID-19 are in a state of rapid change based on information released by regulatory bodies including the CDC and federal and state organizations. These policies and algorithms were followed during the patient's care while in hospital.  REVIEW OF SYSTEMS:   Unable to assess due to sedation and altered mental status  SUBJECTIVE:  Unable to assess due to sedation and altered mental status  VITAL SIGNS: Temp:  [97.9 F (36.6 C)-98.6 F (37 C)] 97.9 F (36.6 C) (07/15 2200) Pulse Rate:  [44-115] 74 (07/15 2200) Resp:  [10-35] 25 (07/15 2200) BP: (113-164)/(81-113) 134/81 (07/15 2200) SpO2:  [70 %-100 %] 98 % (07/15 2200) Weight:  [66.9 kg] 66.9 kg (07/15 2145)  PHYSICAL EXAMINATION: General: Acutely ill-appearing male, laying in bed, sedated on Precedex, no acute distress Neuro: Heavily sedated with Precedex, pupils PERRLA HEENT: Atraumatic, normocephalic, neck supple, no JVD Cardiovascular: Regular rate and rhythm, S1-S2, no murmurs, rubs, gallops, 2+ pulses throughout Lungs: Clear to auscultation bilaterally, no wheezing, even, nonlabored Abdomen: Soft, nontender, nondistended, no guarding or rebound tenderness, bowel sounds positive x4 Musculoskeletal: Normal bulk and tone, no deformities, no edema Skin: Warm and dry.  No obvious rashes, lesions, ulcerations  Recent Labs  Lab 06/01/20 1923  NA 138    K 4.0  CL 99  CO2 28  BUN <5*  CREATININE 0.60*  GLUCOSE 85   Recent Labs  Lab 06/01/20 1923  HGB 14.1  HCT 39.7  WBC 4.4  PLT 115*   CT HEAD WO CONTRAST  Result Date: 06/03/2020 CLINICAL DATA:  Headache, head trauma, alcohol withdrawal EXAM: CT HEAD WITHOUT CONTRAST TECHNIQUE: Contiguous axial images were obtained from the base of the skull through the vertex without intravenous contrast. COMPARISON:  None. FINDINGS: Brain: Normal anatomic configuration. There is moderate parenchymal volume loss, more than would be typically expected for a patient of this age, particularly involving the cerebellum. No abnormal intra or extra-axial mass lesion or fluid collection. No abnormal mass effect or midline shift. No evidence of acute intracranial hemorrhage or infarct. Ventricular size is normal. Cerebellum unremarkable. Vascular: There is advanced vascular calcifications involving the carotid siphons and terminal vertebral arteries, more severe than would be typically expected in a patient of this age. Skull: Intact Sinuses/Orbits: Paranasal sinuses are clear. Orbits are unremarkable. Other: Mastoid air cells and middle ear cavities are clear. IMPRESSION: No evidence of acute intracranial injury or calvarial fracture. Advanced intracranial vascular calcifications. Clinical correlation for vascular risk factors and aggressive management is recommended. Moderate parenchymal atrophy, more than would be typically expected for a patient of this age. Electronically Signed   By: Helyn Numbers MD   On: 06/03/2020 15:14    ASSESSMENT / PLAN:  Severe Alcohol Withdrawal -CIWA protocol -Continue Librium as able -Precedex  as needed  -Thiamine, folic acid, MVI -Currently maintaining his airway, but high risk for aspiration and intubation  Homicidal ideation Hx: Bipolar disorder -Psychiatry following, appreciate input (will need admission to Behavioral unit once medically cleared) -IVC -Homicidal &  Suicide precautions  Elevated LFT's, likely secondary to ETOH abuse -Trend LFT's -HIV antibody and Hepatitis panel pending  Hypertension -Prn IV Hydralazine  Thrombocytopenia, likely in setting of ETOH abuse -Monitor for S/Sx of bleeding -Trend CBC -SCD's for VTE Prophylaxis  -Transfuse for Hgb <7 -Transfuse platelets for platelet count <50 with active bleeding -LDH & Peripheral smear pending           DISPOSITION: Stepdown GOALS OF CARE: Full code VTE PROPHYLAXIS: SQ Lovenox CONSULTS: Hospitalist, Psych UPDATES: No family present at bedside during NP rounds 06/03/20.  Harlon Ditty, AGACNP-BC [facility] Pager: [phone]  06/03/2020, 10:53 PM

## 2020-06-04 DIAGNOSIS — F3162 Bipolar disorder, current episode mixed, moderate: Secondary | ICD-10-CM

## 2020-06-04 LAB — COMPREHENSIVE METABOLIC PANEL
ALT: 57 U/L — ABNORMAL HIGH (ref 0–44)
AST: 149 U/L — ABNORMAL HIGH (ref 15–41)
Albumin: 4.1 g/dL (ref 3.5–5.0)
Alkaline Phosphatase: 63 U/L (ref 38–126)
Anion gap: 9 (ref 5–15)
BUN: <5 mg/dL — ABNORMAL LOW (ref 6–20)
CO2: 25 mmol/L (ref 22–32)
Calcium: 9.1 mg/dL (ref 8.9–10.3)
Chloride: 105 mmol/L (ref 98–111)
Creatinine, Ser: 0.56 mg/dL — ABNORMAL LOW (ref 0.61–1.24)
GFR calc Af Amer: >60 mL/min (ref >60)
GFR calc non Af Amer: >60 mL/min (ref >60)
Glucose, Bld: 112 mg/dL — ABNORMAL HIGH (ref 70–99)
Potassium: 3.6 mmol/L (ref 3.5–5.1)
Sodium: 139 mmol/L (ref 135–145)
Total Bilirubin: 1.6 mg/dL — ABNORMAL HIGH (ref 0.3–1.2)
Total Protein: 7.3 g/dL (ref 6.5–8.1)

## 2020-06-04 LAB — CBC
HCT: 35 % — ABNORMAL LOW (ref 39.0–52.0)
Hemoglobin: 12.4 g/dL — ABNORMAL LOW (ref 13.0–17.0)
MCH: 34.7 pg — ABNORMAL HIGH (ref 26.0–34.0)
MCHC: 35.4 g/dL (ref 30.0–36.0)
MCV: 98 fL (ref 80.0–100.0)
Platelets: 92 10*3/uL — ABNORMAL LOW (ref 150–400)
RBC: 3.57 MIL/uL — ABNORMAL LOW (ref 4.22–5.81)
RDW: 13.2 % (ref 11.5–15.5)
WBC: 4.9 10*3/uL (ref 4.0–10.5)
nRBC: 0 % (ref 0.0–0.2)

## 2020-06-04 LAB — HEPATITIS PANEL, ACUTE
HCV Ab: NONREACTIVE
Hep A IgM: NONREACTIVE
Hep B C IgM: NONREACTIVE
Hepatitis B Surface Ag: NONREACTIVE

## 2020-06-04 LAB — SAVE SMEAR(SSMR), FOR PROVIDER SLIDE REVIEW

## 2020-06-04 LAB — PHOSPHORUS: Phosphorus: 4.1 mg/dL (ref 2.5–4.6)

## 2020-06-04 LAB — PATHOLOGIST SMEAR REVIEW

## 2020-06-04 LAB — MAGNESIUM: Magnesium: 1.8 mg/dL (ref 1.7–2.4)

## 2020-06-04 MED ORDER — MAGNESIUM SULFATE 2 GM/50ML IV SOLN
2.0000 g | Freq: Once | INTRAVENOUS | Status: AC
  Administered 2020-06-04: 2 g via INTRAVENOUS
  Filled 2020-06-04: qty 50

## 2020-06-04 MED ORDER — ADULT MULTIVITAMIN W/MINERALS CH
1.0000 | ORAL_TABLET | Freq: Every day | ORAL | Status: DC
  Administered 2020-06-04 – 2020-06-22 (×16): 1 via ORAL
  Filled 2020-06-04 (×17): qty 1

## 2020-06-04 MED ORDER — ENSURE ENLIVE PO LIQD
237.0000 mL | Freq: Three times a day (TID) | ORAL | Status: DC
  Administered 2020-06-04 – 2020-06-08 (×6): 237 mL via ORAL

## 2020-06-04 MED ORDER — FOLIC ACID 1 MG PO TABS
1.0000 mg | ORAL_TABLET | Freq: Every day | ORAL | Status: DC
  Administered 2020-06-04 – 2020-06-22 (×16): 1 mg via ORAL
  Filled 2020-06-04 (×17): qty 1

## 2020-06-04 MED ORDER — LORAZEPAM 2 MG/ML IJ SOLN
1.0000 mg | INTRAMUSCULAR | Status: AC | PRN
  Administered 2020-06-05 (×3): 2 mg via INTRAVENOUS
  Filled 2020-06-04 (×3): qty 1

## 2020-06-04 MED ORDER — THIAMINE HCL 100 MG/ML IJ SOLN
Freq: Once | INTRAVENOUS | Status: AC
  Filled 2020-06-04: qty 1000

## 2020-06-04 MED ORDER — LORAZEPAM 1 MG PO TABS
1.0000 mg | ORAL_TABLET | ORAL | Status: AC | PRN
  Filled 2020-06-04: qty 1

## 2020-06-04 MED ORDER — POTASSIUM CHLORIDE CRYS ER 20 MEQ PO TBCR
40.0000 meq | EXTENDED_RELEASE_TABLET | Freq: Once | ORAL | Status: AC
  Administered 2020-06-04: 40 meq via ORAL
  Filled 2020-06-04: qty 2

## 2020-06-04 NOTE — Progress Notes (Addendum)
Patient status is improving.  Upon assessment this morning he was lethargic and mumbling his words.  He was not oriented to his room nor his location.  He had soft wrist restraints on bilateral extremities.    Patient currently resting.  He has sweat building on the forehead and chest.  He has tremors in his hands that is visible when he holds his arms up.  While administering medications, patient was able to sit up independently and hold his water to drink and he was able to focus to swallow his medicines.  Removed wrist restraints.  Removed oxygen as patient is saturating in the high 90's on room air.  Will continue to monitor.    Patient had 7 beat run of v-tach this morning around 0730 and 10 beat run v-tach around 1230.  Physician aware.

## 2020-06-04 NOTE — TOC Initial Note (Signed)
Transition of Care [facility]) - Initial/Assessment Note    Patient Details  Name: Jason Atkins MRN: [id number] Date of Birth: 01/27/1971  Transition of Care [facility]) CM/SW Contact:    Marina Goodell Phone Number: [phone] 06/04/2020, 8:57 AM  Clinical Narrative:                  Patient is currently IVC, in ICU w/ fluctuating orientation with tremors.  Patient still needs psych assessment.  TOC will follow.       Patient Goals and CMS Choice        Expected Discharge Plan and Services                                                Prior Living Arrangements/Services                       Activities of Daily Living Home Assistive Devices/Equipment: None ADL Screening (condition at time of admission) Patient's cognitive ability adequate to safely complete daily activities?: Yes Is the patient deaf or have difficulty hearing?: No Does the patient have difficulty seeing, even when wearing glasses/contacts?: No Does the patient have difficulty concentrating, remembering, or making decisions?: No Patient able to express need for assistance with ADLs?: No Does the patient have difficulty dressing or bathing?: No Independently performs ADLs?: Yes (appropriate for developmental age) Does the patient have difficulty walking or climbing stairs?: No Weakness of Legs: None Weakness of Arms/Hands: None  Permission Sought/Granted                  Emotional Assessment              Admission diagnosis:  Alcohol withdrawal (HCC) [F10.239] Homicidal ideation [R45.850] Alcoholic intoxication without complication (HCC) [F10.920] Patient Active Problem List   Diagnosis Date Noted  . Hypertension   . Coronary artery disease   . Alcohol withdrawal (HCC)   . Homicidal ideation   . Thrombocytopenia (HCC)   . Tobacco abuse   . Abnormal LFTs   . Fall   . Bipolar disorder (HCC)   . Hepatic encephalopathy (HCC) 07/28/2019  . Alcohol abuse 10/01/2018   . Substance induced mood disorder (HCC) 10/01/2018   PCP:  [facility]:   [facility] [phone] [place], [place] - [address] [place] [address] Phone: [phone] Fax: [phone]  [facility] - [place], [place] - [address] [place] [address] Phone: [phone] Fax: [phone]     Social Determinants of Health (SDOH) Interventions    Readmission Risk Interventions No flowsheet data found.

## 2020-06-04 NOTE — Progress Notes (Signed)
PROGRESS NOTE    Jason Atkins  ZOX:[id number] DOB: 03/19/1971 DOA: 06/01/2020 [facility]   Brief Narrative:  HPI: Jason Atkins is a 49 y.o. male with medical history significant of hypertension, CAD, CABG, alcohol abuse, tobacco abuse, hepatic encephalopathy, who presents with alcohol withdrawal, homicidal ideations.  Per his wife, patient has been heavily drinking, approximately 25-30 beers each time.  He has decreased oral intake recently.  He has been intermittently confused.  He fell at Sunday morning, no loss of consciousness.  Not sure if he injured his head.  No unilateral numbness or tingling in his extremities. No facial droop or slurred speech. Patient has intermittent diarrhea in the past, but no diarrhea in the past several days.  Patient denies nausea, vomiting, abdominal pain.  Patient denies chest pain, shortness of breath, cough.  No fever or chills. Per EDP, pt reportedly had homicidal ideation, but the patient denies suicidal or homicidal ideations now.  7/16: Patient seen and examined.  Remains on Precedex infusion in stepdown status.  Lethargic but does awaken answer questions.  Pleasant noncombative.   Assessment & Plan:   Principal Problem:   Alcohol withdrawal (HCC) Active Problems:   Alcohol abuse   Hypertension   Coronary artery disease   Homicidal ideation   Thrombocytopenia (HCC)   Tobacco abuse   Abnormal LFTs   Fall   Bipolar disorder (HCC)  Alcohol withdrawal (HCC) CIWA score 10-14 in ED. patient is tremulous, no seizure. Worsened after admission requiring admission to stepdown Currently in SDU on Precedex infusion Plan: Continue stepdown status Precedex infusion, wean as tolerated CIWA protocol Continue soft restraints for now, can likely discontinue within 24 hours  Hx of bipolar disorder and homicidal ideation Psychiatry, Dr. Smith Robertao is consulted.   Recommended to treat patient for alcohol withdrawal first, then admit to  behavioral unit. On IVC Psych medication recs: Librium 50 mg po qid from 25 Zyprexa 10 bid also added regular oral dosing  Along with Reg low dose haldol  -Precautions homicidal and suicidal -Haldol 0.5 3 times daily  Alcohol abuse and tobacco abuse -nicotine patch and CiWA   Hypertension: -IV hydralazine as needed -Hold HCTZ since patient need IV fluid -Clonidine 0.1 mg twice daily  Coronary artery disease: s/p of CABG. No CP -continue ASA  Thrombocytopenia (HCC): Likely due to alcohol abuse.  No bleeding tendency -Check LDH and peripheral smear  Abnormal LFTs: likely due to alcohol abuse -Check HIV antibody and hepatitis panel -Avoid using Tylenol  Fall: Likely due to alcohol intoxication -f/u CT-head    DVT prophylaxis: Lovenox Code Status: Full Family Communication: None today Disposition Plan: Status is: Inpatient  Remains inpatient appropriate because:Altered mental status   Dispo: The patient is from: Home              Anticipated d/c is to: Inpatient behavioral unit              Anticipated d/c date is: 2 days              Patient currently is not medically stable to d/c.   Remains in stepdown on Precedex drip.  Plan to dispo to inpatient psych once medically stabilized     Consultants:   PCCM  Psychiatry  Procedures:   None  Antimicrobials:   None   Subjective: Seen and examined.  Not combative.  Pleasantly confused  Objective: Vitals:   06/04/20 0800 06/04/20 0900 06/04/20 1000 06/04/20 1100  BP: (!) 156/100 (!) 135/98 Marland Kitchen(!)  152/101 (!) 151/103  Pulse: 61 (!) 58 (!) 59 (!) 59  Resp: 18 17 19 20   Temp: (!) 97.5 F (36.4 C)     TempSrc: Oral     SpO2: 100% 100% 100% 100%  Weight:      Height:        Intake/Output Summary (Last 24 hours) at 06/04/2020 1242 Last data filed at 06/04/2020 0900 Gross per 24 hour  Intake 1911 ml  Output --  Net 1911 ml   Filed Weights   06/01/20 1502 06/01/20 1512 06/03/20 2145  Weight:  79.4 kg 79.4 kg 66.9 kg    Examination:  General exam: Appears calm and comfortable  Respiratory system: Clear to auscultation. Respiratory effort normal. Cardiovascular system: S1 & S2 heard, RRR. No JVD, murmurs, rubs, gallops or clicks. No pedal edema. Gastrointestinal system: Abdomen is nondistended, soft and nontender. No organomegaly or masses felt. Normal bowel sounds heard. Central nervous system: Alert to person.  Confused Extremities: Symmetric 5 x 5 power. Skin: No rashes, lesions or ulcers Psychiatry: Lethargic, confused    Data Reviewed: I have personally reviewed following labs and imaging studies  CBC: Recent Labs  Lab 06/01/20 1923 06/04/20 0324  WBC 4.4 4.9  NEUTROABS 1.9  --   HGB 14.1 12.4*  HCT 39.7 35.0*  MCV 96.6 98.0  PLT 115* 92*   Basic Metabolic Panel: Recent Labs  Lab 06/01/20 1923 06/04/20 0324  NA 138 139  K 4.0 3.6  CL 99 105  CO2 28 25  GLUCOSE 85 112*  BUN <5* <5*  CREATININE 0.60* 0.56*  CALCIUM 8.9 9.1  MG  --  1.8  PHOS  --  4.1   GFR: Estimated Creatinine Clearance: 106.9 mL/min (A) (by C-G formula based on SCr of 0.56 mg/dL (L)). Liver Function Tests: Recent Labs  Lab 06/01/20 1923 06/04/20 0324  AST 120* 149*  ALT 50* 57*  ALKPHOS 75 63  BILITOT 1.0 1.6*  PROT 8.3* 7.3  ALBUMIN 4.7 4.1   No results for input(s): LIPASE, AMYLASE in the last 168 hours. Recent Labs  Lab 06/03/20 2312  AMMONIA 52*   Coagulation Profile: No results for input(s): INR, PROTIME in the last 168 hours. Cardiac Enzymes: No results for input(s): CKTOTAL, CKMB, CKMBINDEX, TROPONINI in the last 168 hours. BNP (last 3 results) No results for input(s): PROBNP in the last 8760 hours. HbA1C: No results for input(s): HGBA1C in the last 72 hours. CBG: Recent Labs  Lab 06/03/20 2141  GLUCAP 121*   Lipid Profile: No results for input(s): CHOL, HDL, LDLCALC, TRIG, CHOLHDL, LDLDIRECT in the last 72 hours. Thyroid Function Tests: No  results for input(s): TSH, T4TOTAL, FREET4, T3FREE, THYROIDAB in the last 72 hours. Anemia Panel: No results for input(s): VITAMINB12, FOLATE, FERRITIN, TIBC, IRON, RETICCTPCT in the last 72 hours. Sepsis Labs: No results for input(s): PROCALCITON, LATICACIDVEN in the last 168 hours.  Recent Results (from the past 240 hour(s))  SARS Coronavirus 2 by RT PCR (hospital order, performed in [facility] hospital lab) Nasopharyngeal Nasopharyngeal Swab     Status: None   Collection Time: 06/02/20  2:29 AM   Specimen: Nasopharyngeal Swab  Result Value Ref Range Status   SARS Coronavirus 2 NEGATIVE NEGATIVE Final    Comment: (NOTE) SARS-CoV-2 target nucleic acids are NOT DETECTED.  The SARS-CoV-2 RNA is generally detectable in upper and lower respiratory specimens during the acute phase of infection. The lowest concentration of SARS-CoV-2 viral copies this assay can detect is 250  copies / mL. A negative result does not preclude SARS-CoV-2 infection and should not be used as the sole basis for treatment or other patient management decisions.  A negative result may occur with improper specimen collection / handling, submission of specimen other than nasopharyngeal swab, presence of viral mutation(s) within the areas targeted by this assay, and inadequate number of viral copies (<250 copies / mL). A negative result must be combined with clinical observations, patient history, and epidemiological information.  Fact Sheet for Patients:   BoilerBrush.com.cy  Fact Sheet for Healthcare Providers: https://pope.com/  This test is not yet approved or  cleared by the [place] FDA and has been authorized for detection and/or diagnosis of SARS-CoV-2 by FDA under an Emergency Use Authorization (EUA).  This EUA will remain in effect (meaning this test can be used) for the duration of the COVID-19 declaration under Section 564(b)(1) of the Act, 21  U.S.C. section 360bbb-3(b)(1), unless the authorization is terminated or revoked sooner.  Performed at [facility], [address] [place], [place] [address]   MRSA PCR Screening     Status: None   Collection Time: 06/03/20  9:45 PM   Specimen: Nasal Mucosa; Nasopharyngeal  Result Value Ref Range Status   MRSA by PCR NEGATIVE NEGATIVE Final    Comment:        The GeneXpert MRSA Assay (FDA approved for NASAL specimens only), is one component of a comprehensive MRSA colonization surveillance program. It is not intended to diagnose MRSA infection nor to guide or monitor treatment for MRSA infections. Performed at [facility], [address] [place], [place] [address]          Radiology Studies: CT HEAD WO CONTRAST  Result Date: 06/03/2020 CLINICAL DATA:  Headache, head trauma, alcohol withdrawal EXAM: CT HEAD WITHOUT CONTRAST TECHNIQUE: Contiguous axial images were obtained from the base of the skull through the vertex without intravenous contrast. COMPARISON:  None. FINDINGS: Brain: Normal anatomic configuration. There is moderate parenchymal volume loss, more than would be typically expected for a patient of this age, particularly involving the cerebellum. No abnormal intra or extra-axial mass lesion or fluid collection. No abnormal mass effect or midline shift. No evidence of acute intracranial hemorrhage or infarct. Ventricular size is normal. Cerebellum unremarkable. Vascular: There is advanced vascular calcifications involving the carotid siphons and terminal vertebral arteries, more severe than would be typically expected in a patient of this age. Skull: Intact Sinuses/Orbits: Paranasal sinuses are clear. Orbits are unremarkable. Other: Mastoid air cells and middle ear cavities are clear. IMPRESSION: No evidence of acute intracranial injury or calvarial fracture. Advanced intracranial vascular calcifications. Clinical correlation for vascular risk factors  and aggressive management is recommended. Moderate parenchymal atrophy, more than would be typically expected for a patient of this age. Electronically Signed   By: Helyn Numbers MD   On: 06/03/2020 15:14        Scheduled Meds: . aspirin EC  81 mg Oral Daily  . benztropine  0.5 mg Oral BID  . chlordiazePOXIDE  50 mg Oral QID  . Chlorhexidine Gluconate Cloth  6 each Topical Q0600  . cloNIDine  0.1 mg Oral BID  . enoxaparin (LOVENOX) injection  40 mg Subcutaneous Q24H  . feeding supplement (ENSURE ENLIVE)  237 mL Oral TID BM  . folic acid  1 mg Oral Daily  . haloperidol  0.5 mg Oral TID  . LORazepam  0-4 mg Intravenous Q12H   Or  . LORazepam  0-4 mg  Oral Q12H  . multivitamin with minerals  1 tablet Oral Daily  . nicotine  21 mg Transdermal Daily  . OLANZapine zydis  10 mg Oral BID  . thiamine  100 mg Oral Daily   Or  . thiamine  100 mg Intravenous Daily   Continuous Infusions: . dexmedetomidine (PRECEDEX) IV infusion 0.8 mcg/kg/hr (06/04/20 0900)     LOS: 0 days    Time spent: 25 minutes    Tresa Moore, MD [facility] Pager [phone]  If 7PM-7AM, please contact night-coverage 06/04/2020, 12:42 PM

## 2020-06-04 NOTE — Progress Notes (Signed)
Patient has increased his mentation throughout the shift.  This morning his words were incomprehensible and slow but he is now speaking in sentences to convey physical and emotional needs.  His vitals remain WDL.  He is no longer needing oxygen. He has been assisted to sit up and eat his lunch and dinner.  He takes small bites and has no signs of aspiration.  He is receiving precedex 0.65mcg and he is receiving vital amines via a banana bag.  He now urinates into a urinal with assistance.  His UOP is WDL yellow and clear.  He received 1mg  ativan once d/t CIWA score of 9.  Patient maintains tremors of the arms and sweat beads of the forehead.  He has had a few episodes of simple visual hallucination.

## 2020-06-04 NOTE — Consult Note (Signed)
PHARMACY CONSULT NOTE - FOLLOW UP  Pharmacy Consult for Electrolyte Monitoring and Replacement   Recent Labs: Potassium (mmol/L)  Date Value  06/04/2020 3.6  01/08/2014 3.5   Magnesium (mg/dL)  Date Value  18/56/3149 1.8   Calcium (mg/dL)  Date Value  70/26/3785 9.1   Calcium, Total (mg/dL)  Date Value  88/50/2774 9.3   Albumin (g/dL)  Date Value  12/87/8676 4.1  01/08/2014 4.4   Phosphorus (mg/dL)  Date Value  72/07/4708 4.1   Sodium (mmol/L)  Date Value  06/04/2020 139  01/08/2014 139     Assessment: Patient is a 49 y/o M with medical history including HTN, CAD s/p CABG, EtOH abuse, tobacco abuse who is admitted with alcohol withdrawal. Patient has had several runs of v-tach today.  Goal of Therapy:  Potassium ~4 Magnesium ~2 All other electrolytes within normal limits  Plan:  -Magnesium sulfate 2 g IV x 1 -KCl 40 mEq PO x 1 -F/u electrolytes tomorrow AM  Tressie Ellis 06/04/2020 3:44 PM

## 2020-06-04 NOTE — Progress Notes (Signed)
[facility] MD Progress Note  06/04/2020 4:37 PM Jason Atkins  MRN:  [id number] Subjective:    Patient asleep  Principal Problem: Alcohol withdrawal (HCC) Diagnosis: Principal Problem:   Alcohol withdrawal (HCC) Active Problems:   Alcohol abuse   Hypertension   Coronary artery disease   Homicidal ideation   Thrombocytopenia (HCC)   Tobacco abuse   Abnormal LFTs   Fall   Bipolar disorder (HCC) alcohol intoxication, dependence   Total Time spent with patient: 25-30  Patient Currently in ICU on precedex pending ETOH withdrawal ---recovery   Seen also by me in ER after needing increasing meds, but went into difficult DT's agitation and wandering behaviors  Has revolving door admissions at times with unclear compliance on bipolar meds  He also has baseline mood swings, ups and downs, lability, highs and lows, speeded actions and thoughts along with mixture of major depression and generalized anxiety  Needs dual diagnosis Psych admission to manage and observe new medications in controlled setting that also helps with ETOH issues as well.       Past Psychiatric History:  Long history of dual diagnosis problems and psych social stressors interfacing with deprivation and all in the community needing more stability if he could attain more compliance and cooperation   Past Medical History:  Past Medical History:  Diagnosis Date  . Alcohol abuse   . Bipolar disorder (HCC)   . Coronary artery disease   . Heart attack (HCC)    x4  . Hx of CABG   . Hypertension   . Tobacco abuse     Past Surgical History:  Procedure Laterality Date  . CARDIAC SURGERY    . CORONARY ARTERY BYPASS GRAFT     Family History:  Family History  Problem Relation Age of Onset  . Diabetes Mellitus II Father   . Heart attack Father    Family Psychiatric  History:  Previously discussed  Social History:  Social History   Substance and Sexual Activity  Alcohol Use Yes   Comment: 2 40s per day     Social  History   Substance and Sexual Activity  Drug Use No    Social History   Socioeconomic History  . Marital status: Married    Spouse name: Not on file  . Number of children: Not on file  . Years of education: Not on file  . Highest education level: Not on file  Occupational History  . Not on file  Tobacco Use  . Smoking status: Current Every Day Smoker    Packs/day: 0.50    Types: Cigarettes  . Smokeless tobacco: Never Used  Substance and Sexual Activity  . Alcohol use: Yes    Comment: 2 40s per day  . Drug use: No  . Sexual activity: Not on file  Other Topics Concern  . Not on file  Social History Narrative  . Not on file   Social Determinants of Health   Financial Resource Strain:   . Difficulty of Paying Living Expenses:   Food Insecurity:   . Worried About Programme researcher, broadcasting/film/video in the Last Year:   . Barista in the Last Year:   Transportation Needs:   . Freight forwarder (Medical):   Marland Kitchen Lack of Transportation (Non-Medical):   Physical Activity:   . Days of Exercise per Week:   . Minutes of Exercise per Session:   Stress:   . Feeling of Stress :   Social Connections:   .  Frequency of Communication with Friends and Family:   . Frequency of Social Gatherings with Friends and Family:   . Attends Religious Services:   . Active Member of Clubs or Organizations:   . Attends Banker Meetings:   Marland Kitchen Marital Status:    Additional Social History:    Pain Medications: See PTA Prescriptions: See PTA Over the Counter: See PTA History of alcohol / drug use?: Yes Withdrawal Symptoms: Agitation                    Sleep: impaired due to psychosis and withdrawal  Appetite:   Limited     Current Medications: Current Facility-Administered Medications  Medication Dose Route Frequency Provider Last Rate Last Admin  . aspirin EC tablet 81 mg  81 mg Oral Daily Lorretta Harp, MD   81 mg at 06/04/20 1113  . benztropine (COGENTIN) tablet 0.5 mg   0.5 mg Oral BID Roselind Messier, MD   0.5 mg at 06/04/20 1113  . chlordiazePOXIDE (LIBRIUM) capsule 50 mg  50 mg Oral QID Roselind Messier, MD   50 mg at 06/04/20 1500  . Chlorhexidine Gluconate Cloth 2 % PADS 6 each  6 each Topical Q0600 Manuela Schwartz, NP   6 each at 06/03/20 2222  . cloNIDine (CATAPRES) tablet 0.1 mg  0.1 mg Oral BID Lorretta Harp, MD   0.1 mg at 06/04/20 1113  . dexmedetomidine (PRECEDEX) 400 MCG/100ML (4 mcg/mL) infusion  0.4-1.2 mcg/kg/hr Intravenous Titrated Harlon Ditty D, NP 11.91 mL/hr at 06/04/20 1500 0.6 mcg/kg/hr at 06/04/20 1500  . enoxaparin (LOVENOX) injection 40 mg  40 mg Subcutaneous Q24H Lorretta Harp, MD   40 mg at 06/03/20 2222  . feeding supplement (ENSURE ENLIVE) (ENSURE ENLIVE) liquid 237 mL  237 mL Oral TID BM Sreenath, Sudheer B, MD   237 mL at 06/04/20 1500  . folic acid (FOLVITE) tablet 1 mg  1 mg Oral Daily Harlon Ditty D, NP   1 mg at 06/04/20 1113  . hydrALAZINE (APRESOLINE) injection 5 mg  5 mg Intravenous Q2H PRN Lorretta Harp, MD      . LORazepam (ATIVAN) injection 0-4 mg  0-4 mg Intravenous Q12H Lorretta Harp, MD       Or  . LORazepam (ATIVAN) tablet 0-4 mg  0-4 mg Oral Q12H Lorretta Harp, MD   1 mg at 06/04/20 1500  . LORazepam (ATIVAN) tablet 1-4 mg  1-4 mg Oral Q1H PRN Judithe Modest, NP       Or  . LORazepam (ATIVAN) injection 1-4 mg  1-4 mg Intravenous Q1H PRN Harlon Ditty D, NP      . magnesium sulfate IVPB 2 g 50 mL  2 g Intravenous Once Tressie Ellis, RPH 50 mL/hr at 06/04/20 1615 2 g at 06/04/20 1615  . multivitamin with minerals tablet 1 tablet  1 tablet Oral Daily Harlon Ditty D, NP   1 tablet at 06/04/20 1113  . nicotine (NICODERM CQ - dosed in mg/24 hours) patch 21 mg  21 mg Transdermal Daily Lorretta Harp, MD   21 mg at 06/04/20 1112  . OLANZapine zydis (ZYPREXA) disintegrating tablet 10 mg  10 mg Oral BID Roselind Messier, MD   10 mg at 06/04/20 1114  . ondansetron (ZOFRAN) injection 4 mg  4 mg Intravenous Q8H PRN Lorretta Harp, MD       . potassium chloride SA (KLOR-CON) CR tablet 40 mEq  40 mEq Oral Once Tressie Ellis, [facility]      .  thiamine tablet 100 mg  100 mg Oral Daily Lorretta Harp, MD   100 mg at 06/03/20 6226   Or  . thiamine (B-1) injection 100 mg  100 mg Intravenous Daily Lorretta Harp, MD   100 mg at 06/04/20 1106    Lab Results:  Results for orders placed or performed during the hospital encounter of 06/01/20 (from the past 48 hour(s))  Pathologist smear review     Status: None   Collection Time: 06/03/20  1:00 PM  Result Value Ref Range   Path Review Blood smear is reviewed.     Comment: Patient presents with alcohol withdrawal. Thombocytopenia.  Morphology of RBCs, WBCs, and platelets within normal limits. No evidence of circulating blasts or schistocytes.  Reviewed by Lurena Joiner, M.D. Performed at [facility], [address] [place], [place] [address]   Glucose, capillary     Status: Abnormal   Collection Time: 06/03/20  9:41 PM  Result Value Ref Range   Glucose-Capillary 121 (H) 70 - 99 mg/dL    Comment: Glucose reference range applies only to samples taken after fasting for at least 8 hours.  MRSA PCR Screening     Status: None   Collection Time: 06/03/20  9:45 PM   Specimen: Nasal Mucosa; Nasopharyngeal  Result Value Ref Range   MRSA by PCR NEGATIVE NEGATIVE    Comment:        The GeneXpert MRSA Assay (FDA approved for NASAL specimens only), is one component of a comprehensive MRSA colonization surveillance program. It is not intended to diagnose MRSA infection nor to guide or monitor treatment for MRSA infections. Performed at [facility], [address] [place], [place] [address]   Hepatitis panel, acute     Status: None   Collection Time: 06/03/20 11:12 PM  Result Value Ref Range   Hepatitis B Surface Ag NON REACTIVE NON REACTIVE   HCV Ab NON REACTIVE NON REACTIVE    Comment: (NOTE) Nonreactive HCV antibody screen is consistent with no HCV infections,   unless recent infection is suspected or other evidence exists to indicate HCV infection.     Hep A IgM NON REACTIVE NON REACTIVE   Hep B C IgM NON REACTIVE NON REACTIVE    Comment: Performed at [facility], [address], [place], [place] [address]  Lactate dehydrogenase     Status: Abnormal   Collection Time: 06/03/20 11:12 PM  Result Value Ref Range   LDH 196 (H) 98 - 192 U/L    Comment: Performed at [facility], [address] [place], [place] [address]  Save Smear     Status: None   Collection Time: 06/03/20 11:12 PM  Result Value Ref Range   Smear Review SMEAR STAINED AND AVAILABLE FOR REVIEW     Comment: Performed at [facility], [address] [place], [place] [address]  Ammonia     Status: Abnormal   Collection Time: 06/03/20 11:12 PM  Result Value Ref Range   Ammonia 52 (H) 9 - 35 umol/L    Comment: Performed at [facility], [address] [place], [place] [address]  Comprehensive metabolic panel     Status: Abnormal   Collection Time: 06/04/20  3:24 AM  Result Value Ref Range   Sodium 139 135 - 145 mmol/L   Potassium 3.6 3.5 - 5.1 mmol/L   Chloride 105 98 - 111 mmol/L   CO2 25 22 - 32 mmol/L   Glucose, Bld 112 (  H) 70 - 99 mg/dL    Comment: Glucose reference range applies only to samples taken after fasting for at least 8 hours.   BUN <5 (L) 6 - 20 mg/dL   Creatinine, Ser 1.610.56 (L) 0.61 - 1.24 mg/dL   Calcium 9.1 8.9 - 09.610.3 mg/dL   Total Protein 7.3 6.5 - 8.1 g/dL   Albumin 4.1 3.5 - 5.0 g/dL   AST 045149 (H) 15 - 41 U/L   ALT 57 (H) 0 - 44 U/L   Alkaline Phosphatase 63 38 - 126 U/L   Total Bilirubin 1.6 (H) 0.3 - 1.2 mg/dL   GFR calc non Af Amer >60 >60 mL/min   GFR calc Af Amer >60 >60 mL/min   Anion gap 9 5 - 15    Comment: Performed at [facility], [address] [place], [place] [address]  CBC     Status: Abnormal   Collection Time: 06/04/20  3:24 AM  Result Value Ref Range   WBC 4.9 4.0 - 10.5 K/uL    RBC 3.57 (L) 4.22 - 5.81 MIL/uL   Hemoglobin 12.4 (L) 13.0 - 17.0 g/dL   HCT 19.135.0 (L) 39 - 52 %   MCV 98.0 80.0 - 100.0 fL   MCH 34.7 (H) 26.0 - 34.0 pg   MCHC 35.4 30.0 - 36.0 g/dL   RDW 47.813.2 29.511.5 - 62.115.5 %   Platelets 92 (L) 150 - 400 K/uL    Comment: PLATELET COUNT CONFIRMED BY SMEAR SPECIMEN CHECKED FOR CLOTS Immature Platelet Fraction may be clinically indicated, consider ordering this additional test [id number]    nRBC 0.0 0.0 - 0.2 %    Comment: Performed at [facility], [address] [place], [place] [address]  Magnesium     Status: None   Collection Time: 06/04/20  3:24 AM  Result Value Ref Range   Magnesium 1.8 1.7 - 2.4 mg/dL    Comment: Performed at [facility], [address] [place], [place] [address]  Phosphorus     Status: None   Collection Time: 06/04/20  3:24 AM  Result Value Ref Range   Phosphorus 4.1 2.5 - 4.6 mg/dL    Comment: Performed at [facility], [address] [place], [place] [address]    Blood Alcohol level:  Lab Results  Component Value Date   ETH 229 (H) 06/01/2020   ETH <10 07/28/2019    Metabolic Disorder Labs: Lab Results  Component Value Date   HGBA1C 4.8 07/28/2019   MPG 91.06 07/28/2019   No results found for: PROLACTIN No results found for: CHOL, TRIG, HDL, CHOLHDL, VLDL, LDLCALC  Physical Findings: AIMS:  , ,  ,  ,    CIWA:  CIWA-Ar Total: 9 COWS:     Musculoskeletal: Strength & Muscle Tone:  On precedex  Gait & Station:  In ICU  Patient leans:   Psychiatric Specialty Exam: Physical Exam  Review of Systems  Blood pressure 102/74, pulse 72, temperature (!) 97.5 F (36.4 C), temperature source Oral, resp. rate 16, height 5\' 11"  (1.803 m), weight 66.9 kg, SpO2 99 %.Body mass index is 20.57 kg/m.    Mental status limited due to sedation at this time currently with sitter in ICU  Treatment Plan  Summary:  As above appreciate SW referring patient to [facility] on IVC ---for dual diagnosis admission 7-10 days followed hopefully by his cooperation for rehab     Roselind Messier, MD 06/04/2020, 4:37 PM

## 2020-06-05 DIAGNOSIS — F3164 Bipolar disorder, current episode mixed, severe, with psychotic features: Secondary | ICD-10-CM

## 2020-06-05 LAB — BASIC METABOLIC PANEL
Anion gap: 12 (ref 5–15)
BUN: 7 mg/dL (ref 6–20)
CO2: 26 mmol/L (ref 22–32)
Calcium: 9.6 mg/dL (ref 8.9–10.3)
Chloride: 101 mmol/L (ref 98–111)
Creatinine, Ser: 0.57 mg/dL — ABNORMAL LOW (ref 0.61–1.24)
GFR calc Af Amer: >60 mL/min (ref >60)
GFR calc non Af Amer: >60 mL/min (ref >60)
Glucose, Bld: 83 mg/dL (ref 70–99)
Potassium: 3.8 mmol/L (ref 3.5–5.1)
Sodium: 139 mmol/L (ref 135–145)

## 2020-06-05 LAB — PHOSPHORUS: Phosphorus: 3.6 mg/dL (ref 2.5–4.6)

## 2020-06-05 LAB — MAGNESIUM: Magnesium: 1.9 mg/dL (ref 1.7–2.4)

## 2020-06-05 MED ORDER — SODIUM CHLORIDE 0.9 % IV BOLUS
1000.0000 mL | Freq: Once | INTRAVENOUS | Status: AC
  Administered 2020-06-05: 1000 mL via INTRAVENOUS

## 2020-06-05 NOTE — Consult Note (Addendum)
PHARMACY CONSULT NOTE - FOLLOW UP  Pharmacy Consult for Electrolyte Monitoring and Replacement   Recent Labs: Potassium (mmol/L)  Date Value  06/05/2020 3.8  01/08/2014 3.5   Magnesium (mg/dL)  Date Value  42/70/6237 1.9   Calcium (mg/dL)  Date Value  62/83/1517 9.6   Calcium, Total (mg/dL)  Date Value  61/60/7371 9.3   Albumin (g/dL)  Date Value  05/15/9484 4.1  01/08/2014 4.4   Phosphorus (mg/dL)  Date Value  46/27/0350 3.6   Sodium (mmol/L)  Date Value  06/05/2020 139  01/08/2014 139     Assessment: Patient is a 49 y/o M with medical history including HTN, CAD s/p CABG, EtOH abuse, tobacco abuse who is admitted with alcohol withdrawal.  Goal of Therapy:  Potassium ~4 Magnesium ~2 All other electrolytes within normal limits  Plan:  Patient able to eat small amount yesterday. Will defer additional electrolyte replacement today. Defer frequency of lab monitoring to MD.  Pricilla Riffle, PharmD 06/05/2020 7:22 AM

## 2020-06-05 NOTE — Progress Notes (Signed)
Cross Cover Brief Note Security alert called on patient. Arrived at bedside patient recently restarted on precedex and 2 mg ativan already given. CCM NP at bedside and per her report they have been consulted.  P Np precedex increased and bladder scan 290. She will also repeat routine labs.

## 2020-06-05 NOTE — Progress Notes (Signed)
PROGRESS NOTE    Jason Finchddie Schlotter  ZOX:[id number] DOB: 01-28-1971 DOA: 06/01/2020 PCP: [facility]   Brief Narrative:  HPI: Jason Atkins is a 49 y.o. male with medical history significant of hypertension, CAD, CABG, alcohol abuse, tobacco abuse, hepatic encephalopathy, who presents with alcohol withdrawal, homicidal ideations.  Per his wife, patient has been heavily drinking, approximately 25-30 beers each time.  He has decreased oral intake recently.  He has been intermittently confused.  He fell at Sunday morning, no loss of consciousness.  Not sure if he injured his head.  No unilateral numbness or tingling in his extremities. No facial droop or slurred speech. Patient has intermittent diarrhea in the past, but no diarrhea in the past several days.  Patient denies nausea, vomiting, abdominal pain.  Patient denies chest pain, shortness of breath, cough.  No fever or chills. Per EDP, pt reportedly had homicidal ideation, but the patient denies suicidal or homicidal ideations now.  7/16: Patient seen and examined.  Remains on Precedex infusion in stepdown status.  Lethargic but does awaken answer questions.  Pleasant noncombative.  7/17: Patient seen and examined.  Precedex infusion has been weaned off.  Mental status much improved.  Still little lethargic but pleasant and answers all questions appropriately.   Assessment & Plan:   Principal Problem:   Alcohol withdrawal (HCC) Active Problems:   Alcohol abuse   Hypertension   Coronary artery disease   Homicidal ideation   Thrombocytopenia (HCC)   Tobacco abuse   Abnormal LFTs   Fall   Bipolar disorder (HCC)  Alcohol withdrawal (HCC) CIWA score 10-14 in ED. patient is tremulous, no seizure. Worsened after admission requiring admission to stepdown Currently in SDU on Precedex infusion Plan: Continue stepdown status for now Precedex infusion can be weaned off we can likely downgrade patient status later today CIWA  protocol   Hx of bipolar disorder and homicidal ideation Psychiatry, Dr. Smith Robertao is consulted.   Recommended to treat patient for alcohol withdrawal first, then admit to behavioral unit. On IVC Psych medication recs: Librium 50 mg po qid from 25 Zyprexa 10 bid also added regular oral dosing  Along with Reg low dose haldol  -Precautions homicidal and suicidal -Haldol 0.5 3 times daily  Alcohol abuse and tobacco abuse -nicotine patch and CiWA   Hypertension: -IV hydralazine as needed -Hold HCTZ since patient need IV fluid -Clonidine 0.1 mg twice daily  Coronary artery disease: s/p of CABG. No CP -continue ASA  Thrombocytopenia (HCC): Likely due to alcohol abuse.  No bleeding tendency -Check LDH and peripheral smear  Abnormal LFTs: likely due to alcohol abuse -Check HIV antibody and hepatitis panel -Avoid using Tylenol  Fall: Likely due to alcohol intoxication -f/u CT-head    DVT prophylaxis: Lovenox Code Status: Full Family Communication: None today Disposition Plan: Status is: Inpatient  Remains inpatient appropriate because:Altered mental status   Dispo: The patient is from: Home              Anticipated d/c is to: Inpatient behavioral unit              Anticipated d/c date is: 2 days              Patient currently is not medically stable to d/c.   Remains in stepdown on Precedex drip.  Plan to dispo to inpatient psych once medically stabilized     Consultants:   PCCM  Psychiatry  Procedures:   None  Antimicrobials:   None  Subjective: Seen and examined.  Not combative.  Pleasantly confused  Objective: Vitals:   06/05/20 1005 06/05/20 1100 06/05/20 1200 06/05/20 1300  BP: 94/68 (!) 83/65 (!) 85/56 (!) 79/66  Pulse: 80 90 96 (!) 103  Resp: 17 (!) 21 19 13   Temp:      TempSrc:      SpO2: 99% 99% 99% 100%  Weight:      Height:        Intake/Output Summary (Last 24 hours) at 06/05/2020 1427 Last data filed at 06/05/2020 1256 Gross  per 24 hour  Intake 258.89 ml  Output 2200 ml  Net -1941.11 ml   Filed Weights   06/01/20 1502 06/01/20 1512 06/03/20 2145  Weight: 79.4 kg 79.4 kg 66.9 kg    Examination:  General exam: Appears calm and comfortable  Respiratory system: Clear to auscultation. Respiratory effort normal. Cardiovascular system: S1 & S2 heard, RRR. No JVD, murmurs, rubs, gallops or clicks. No pedal edema. Gastrointestinal system: Abdomen is nondistended, soft and nontender. No organomegaly or masses felt. Normal bowel sounds heard. Central nervous system: Alert to person.  Confused Extremities: Symmetric 5 x 5 power. Skin: No rashes, lesions or ulcers Psychiatry: Lethargic, confused    Data Reviewed: I have personally reviewed following labs and imaging studies  CBC: Recent Labs  Lab 06/01/20 1923 06/04/20 0324  WBC 4.4 4.9  NEUTROABS 1.9  --   HGB 14.1 12.4*  HCT 39.7 35.0*  MCV 96.6 98.0  PLT 115* 92*   Basic Metabolic Panel: Recent Labs  Lab 06/01/20 1923 06/04/20 0324 06/05/20 0611  NA 138 139 139  K 4.0 3.6 3.8  CL 99 105 101  CO2 28 25 26   GLUCOSE 85 112* 83  BUN <5* <5* 7  CREATININE 0.60* 0.56* 0.57*  CALCIUM 8.9 9.1 9.6  MG  --  1.8 1.9  PHOS  --  4.1 3.6   GFR: Estimated Creatinine Clearance: 106.9 mL/min (A) (by C-G formula based on SCr of 0.57 mg/dL (L)). Liver Function Tests: Recent Labs  Lab 06/01/20 1923 06/04/20 0324  AST 120* 149*  ALT 50* 57*  ALKPHOS 75 63  BILITOT 1.0 1.6*  PROT 8.3* 7.3  ALBUMIN 4.7 4.1   No results for input(s): LIPASE, AMYLASE in the last 168 hours. Recent Labs  Lab 06/03/20 2312  AMMONIA 52*   Coagulation Profile: No results for input(s): INR, PROTIME in the last 168 hours. Cardiac Enzymes: No results for input(s): CKTOTAL, CKMB, CKMBINDEX, TROPONINI in the last 168 hours. BNP (last 3 results) No results for input(s): PROBNP in the last 8760 hours. HbA1C: No results for input(s): HGBA1C in the last 72  hours. CBG: Recent Labs  Lab 06/03/20 2141  GLUCAP 121*   Lipid Profile: No results for input(s): CHOL, HDL, LDLCALC, TRIG, CHOLHDL, LDLDIRECT in the last 72 hours. Thyroid Function Tests: No results for input(s): TSH, T4TOTAL, FREET4, T3FREE, THYROIDAB in the last 72 hours. Anemia Panel: No results for input(s): VITAMINB12, FOLATE, FERRITIN, TIBC, IRON, RETICCTPCT in the last 72 hours. Sepsis Labs: No results for input(s): PROCALCITON, LATICACIDVEN in the last 168 hours.  Recent Results (from the past 240 hour(s))  SARS Coronavirus 2 by RT PCR (hospital order, performed in [facility] hospital lab) Nasopharyngeal Nasopharyngeal Swab     Status: None   Collection Time: 06/02/20  2:29 AM   Specimen: Nasopharyngeal Swab  Result Value Ref Range Status   SARS Coronavirus 2 NEGATIVE NEGATIVE Final    Comment: (NOTE) SARS-CoV-2 target  nucleic acids are NOT DETECTED.  The SARS-CoV-2 RNA is generally detectable in upper and lower respiratory specimens during the acute phase of infection. The lowest concentration of SARS-CoV-2 viral copies this assay can detect is 250 copies / mL. A negative result does not preclude SARS-CoV-2 infection and should not be used as the sole basis for treatment or other patient management decisions.  A negative result may occur with improper specimen collection / handling, submission of specimen other than nasopharyngeal swab, presence of viral mutation(s) within the areas targeted by this assay, and inadequate number of viral copies (<250 copies / mL). A negative result must be combined with clinical observations, patient history, and epidemiological information.  Fact Sheet for Patients:   BoilerBrush.com.cy  Fact Sheet for Healthcare Providers: https://pope.com/  This test is not yet approved or  cleared by the [place] FDA and has been authorized for detection and/or diagnosis of SARS-CoV-2  by FDA under an Emergency Use Authorization (EUA).  This EUA will remain in effect (meaning this test can be used) for the duration of the COVID-19 declaration under Section 564(b)(1) of the Act, 21 U.S.C. section 360bbb-3(b)(1), unless the authorization is terminated or revoked sooner.  Performed at [facility], [address] [place], [place] [address]   MRSA PCR Screening     Status: None   Collection Time: 06/03/20  9:45 PM   Specimen: Nasal Mucosa; Nasopharyngeal  Result Value Ref Range Status   MRSA by PCR NEGATIVE NEGATIVE Final    Comment:        The GeneXpert MRSA Assay (FDA approved for NASAL specimens only), is one component of a comprehensive MRSA colonization surveillance program. It is not intended to diagnose MRSA infection nor to guide or monitor treatment for MRSA infections. Performed at [facility], [address] [place], [place] [address]          Radiology Studies: CT HEAD WO CONTRAST  Result Date: 06/03/2020 CLINICAL DATA:  Headache, head trauma, alcohol withdrawal EXAM: CT HEAD WITHOUT CONTRAST TECHNIQUE: Contiguous axial images were obtained from the base of the skull through the vertex without intravenous contrast. COMPARISON:  None. FINDINGS: Brain: Normal anatomic configuration. There is moderate parenchymal volume loss, more than would be typically expected for a patient of this age, particularly involving the cerebellum. No abnormal intra or extra-axial mass lesion or fluid collection. No abnormal mass effect or midline shift. No evidence of acute intracranial hemorrhage or infarct. Ventricular size is normal. Cerebellum unremarkable. Vascular: There is advanced vascular calcifications involving the carotid siphons and terminal vertebral arteries, more severe than would be typically expected in a patient of this age. Skull: Intact Sinuses/Orbits: Paranasal sinuses are clear. Orbits are unremarkable. Other: Mastoid air cells and  middle ear cavities are clear. IMPRESSION: No evidence of acute intracranial injury or calvarial fracture. Advanced intracranial vascular calcifications. Clinical correlation for vascular risk factors and aggressive management is recommended. Moderate parenchymal atrophy, more than would be typically expected for a patient of this age. Electronically Signed   By: Helyn Numbers MD   On: 06/03/2020 15:14        Scheduled Meds:  aspirin EC  81 mg Oral Daily   benztropine  0.5 mg Oral BID   chlordiazePOXIDE  50 mg Oral QID   Chlorhexidine Gluconate Cloth  6 each Topical Q0600   cloNIDine  0.1 mg Oral BID   enoxaparin (LOVENOX) injection  40 mg Subcutaneous Q24H   feeding supplement (ENSURE ENLIVE)  237 mL  Oral TID BM   folic acid  1 mg Oral Daily   LORazepam  0-4 mg Intravenous Q12H   Or   LORazepam  0-4 mg Oral Q12H   multivitamin with minerals  1 tablet Oral Daily   nicotine  21 mg Transdermal Daily   OLANZapine zydis  10 mg Oral BID   thiamine  100 mg Oral Daily   Or   thiamine  100 mg Intravenous Daily   Continuous Infusions:  dexmedetomidine (PRECEDEX) IV infusion 0.6 mcg/kg/hr (06/05/20 0800)     LOS: 1 day    Time spent: 25 minutes    Tresa Moore, MD [facility] Pager [phone]  If 7PM-7AM, please contact night-coverage 06/05/2020, 2:27 PM

## 2020-06-05 NOTE — Progress Notes (Signed)
Patient remains AO self and situation.  He needs constant redirecting as he thinks his refrigerator with his beer is right outside his room;  He also asks for cigarettes. He moved from the bed to the chair today and sat for about 3 hours.  He complained that his legs were aching and his back was sore.  Around 1640 he became agitated to walk around the his gait is poor.  Delivered 2mg  Ativan for elevated CIWA score 19.  Patient was assisted back to bed for relaxation.  He consumed his meals averaging 70%.  He tried to have a B/M but ended up urinating only.  His vitals remain WDL but he still is having PVC's occasionally.  Family has not called about patient.

## 2020-06-06 LAB — PHOSPHORUS: Phosphorus: 4 mg/dL (ref 2.5–4.6)

## 2020-06-06 LAB — COMPREHENSIVE METABOLIC PANEL
ALT: 63 U/L — ABNORMAL HIGH (ref 0–44)
AST: 106 U/L — ABNORMAL HIGH (ref 15–41)
Albumin: 3.9 g/dL (ref 3.5–5.0)
Alkaline Phosphatase: 68 U/L (ref 38–126)
Anion gap: 6 (ref 5–15)
BUN: 11 mg/dL (ref 6–20)
CO2: 26 mmol/L (ref 22–32)
Calcium: 9 mg/dL (ref 8.9–10.3)
Chloride: 107 mmol/L (ref 98–111)
Creatinine, Ser: 0.56 mg/dL — ABNORMAL LOW (ref 0.61–1.24)
GFR calc Af Amer: >60 mL/min (ref >60)
GFR calc non Af Amer: >60 mL/min (ref >60)
Glucose, Bld: 94 mg/dL (ref 70–99)
Potassium: 3.6 mmol/L (ref 3.5–5.1)
Sodium: 139 mmol/L (ref 135–145)
Total Bilirubin: 1.3 mg/dL — ABNORMAL HIGH (ref 0.3–1.2)
Total Protein: 7.3 g/dL (ref 6.5–8.1)

## 2020-06-06 LAB — CBC
HCT: 37 % — ABNORMAL LOW (ref 39.0–52.0)
Hemoglobin: 12.4 g/dL — ABNORMAL LOW (ref 13.0–17.0)
MCH: 34.3 pg — ABNORMAL HIGH (ref 26.0–34.0)
MCHC: 33.5 g/dL (ref 30.0–36.0)
MCV: 102.5 fL — ABNORMAL HIGH (ref 80.0–100.0)
Platelets: 114 10*3/uL — ABNORMAL LOW (ref 150–400)
RBC: 3.61 MIL/uL — ABNORMAL LOW (ref 4.22–5.81)
RDW: 13.2 % (ref 11.5–15.5)
WBC: 7.1 10*3/uL (ref 4.0–10.5)
nRBC: 0 % (ref 0.0–0.2)

## 2020-06-06 LAB — MAGNESIUM: Magnesium: 1.7 mg/dL (ref 1.7–2.4)

## 2020-06-06 MED ORDER — FOLIC ACID 5 MG/ML IJ SOLN
1.0000 mg | Freq: Once | INTRAMUSCULAR | Status: AC
  Administered 2020-06-06: 1 mg via INTRAVENOUS
  Filled 2020-06-06: qty 0.2
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
## 2020-06-06 NOTE — Progress Notes (Signed)
Psychiatry - unable to evaluate. Patient on precedex infusion.  Mariel Craft, MD

## 2020-06-06 NOTE — Progress Notes (Signed)
Patient remains in bed sedated with precedex 0.11mcg/kg/hr.  He awakens to voice and answers questions but he refuses medicine and food.  His vitals remain WDL on room air.  His daughter called for an update and I explained the visitor policy.  Patient received his folic acid and Thiamine via IV push.  Communicated with physician about holding librium, Zyprexa d/t positive response of precedex and physicians approved.

## 2020-06-06 NOTE — Plan of Care (Signed)
*  review prior note

## 2020-06-06 NOTE — Consult Note (Signed)
PHARMACY CONSULT NOTE - FOLLOW UP  Pharmacy Consult for Electrolyte Monitoring and Replacement   Recent Labs: Potassium (mmol/L)  Date Value  06/06/2020 3.6  01/08/2014 3.5   Magnesium (mg/dL)  Date Value  75/91/6384 1.7   Calcium (mg/dL)  Date Value  66/59/9357 9.0   Calcium, Total (mg/dL)  Date Value  01/77/9390 9.3   Albumin (g/dL)  Date Value  30/07/2329 3.9  01/08/2014 4.4   Phosphorus (mg/dL)  Date Value  07/62/2633 4.0   Sodium (mmol/L)  Date Value  06/06/2020 139  01/08/2014 139     Assessment: Patient is a 49 y/o M with medical history including HTN, CAD s/p CABG, EtOH abuse, tobacco abuse who is admitted with alcohol withdrawal.  Goal of Therapy:  Potassium ~4 Magnesium ~2 All other electrolytes within normal limits  Plan:  Continue to defer replacement as patient is eating. Will follow along as needed.  Pricilla Riffle, PharmD 06/06/2020 7:15 AM
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
## 2020-06-07 ENCOUNTER — Encounter: Payer: Self-pay | Admitting: Internal Medicine

## 2020-06-07 ENCOUNTER — Inpatient Hospital Stay: Payer: Self-pay

## 2020-06-07 LAB — RENAL FUNCTION PANEL
Albumin: 4.1 g/dL (ref 3.5–5.0)
Anion gap: 7 (ref 5–15)
BUN: 15 mg/dL (ref 6–20)
CO2: 27 mmol/L (ref 22–32)
Calcium: 9.2 mg/dL (ref 8.9–10.3)
Chloride: 103 mmol/L (ref 98–111)
Creatinine, Ser: 0.69 mg/dL (ref 0.61–1.24)
GFR calc Af Amer: >60 mL/min (ref >60)
GFR calc non Af Amer: >60 mL/min (ref >60)
Glucose, Bld: 90 mg/dL (ref 70–99)
Phosphorus: 3.6 mg/dL (ref 2.5–4.6)
Potassium: 3.9 mmol/L (ref 3.5–5.1)
Sodium: 137 mmol/L (ref 135–145)

## 2020-06-07 LAB — CBC WITH DIFFERENTIAL/PLATELET
Abs Immature Granulocytes: 0.05 10*3/uL (ref 0.00–0.07)
Basophils Absolute: 0.1 10*3/uL (ref 0.0–0.1)
Basophils Relative: 1 %
Eosinophils Absolute: 0.2 10*3/uL (ref 0.0–0.5)
Eosinophils Relative: 2 %
HCT: 38 % — ABNORMAL LOW (ref 39.0–52.0)
Hemoglobin: 12.7 g/dL — ABNORMAL LOW (ref 13.0–17.0)
Immature Granulocytes: 1 %
Lymphocytes Relative: 14 %
Lymphs Abs: 1.4 10*3/uL (ref 0.7–4.0)
MCH: 34.2 pg — ABNORMAL HIGH (ref 26.0–34.0)
MCHC: 33.4 g/dL (ref 30.0–36.0)
MCV: 102.4 fL — ABNORMAL HIGH (ref 80.0–100.0)
Monocytes Absolute: 2.1 10*3/uL — ABNORMAL HIGH (ref 0.1–1.0)
Monocytes Relative: 21 %
Neutro Abs: 6.3 10*3/uL (ref 1.7–7.7)
Neutrophils Relative %: 61 %
Platelets: 131 10*3/uL — ABNORMAL LOW (ref 150–400)
RBC: 3.71 MIL/uL — ABNORMAL LOW (ref 4.22–5.81)
RDW: 12.8 % (ref 11.5–15.5)
WBC: 10.2 10*3/uL (ref 4.0–10.5)
nRBC: 0 % (ref 0.0–0.2)

## 2020-06-07 LAB — URINALYSIS, COMPLETE (UACMP) WITH MICROSCOPIC
Bacteria, UA: NONE SEEN
Bilirubin Urine: NEGATIVE
Glucose, UA: NEGATIVE mg/dL
Hgb urine dipstick: NEGATIVE
Ketones, ur: NEGATIVE mg/dL
Leukocytes,Ua: NEGATIVE
Nitrite: NEGATIVE
Protein, ur: NEGATIVE mg/dL
Specific Gravity, Urine: 1.018 (ref 1.005–1.030)
Squamous Epithelial / HPF: NONE SEEN (ref 0–5)
pH: 5 (ref 5.0–8.0)

## 2020-06-07 LAB — MAGNESIUM: Magnesium: 1.7 mg/dL (ref 1.7–2.4)

## 2020-06-07 LAB — PROCALCITONIN: Procalcitonin: 0.17 ng/mL

## 2020-06-07 MED ORDER — SODIUM CHLORIDE 0.9 % IV BOLUS
1000.0000 mL | Freq: Once | INTRAVENOUS | Status: AC
  Administered 2020-06-07: 1000 mL via INTRAVENOUS

## 2020-06-07 MED ORDER — SODIUM CHLORIDE 0.9 % IV SOLN: INTRAVENOUS | Status: DC

## 2020-06-07 MED ORDER — NOREPINEPHRINE 4 MG/250ML-% IV SOLN
INTRAVENOUS | Status: AC
  Filled 2020-06-07: qty 250

## 2020-06-07 MED ORDER — TAMSULOSIN HCL 0.4 MG PO CAPS
0.4000 mg | ORAL_CAPSULE | Freq: Every day | ORAL | Status: DC
  Administered 2020-06-07 – 2020-06-21 (×11): 0.4 mg via ORAL
  Filled 2020-06-07 (×11): qty 1

## 2020-06-07 MED ORDER — LORAZEPAM 2 MG/ML IJ SOLN
1.0000 mg | INTRAMUSCULAR | Status: AC | PRN
  Administered 2020-06-07: 4 mg via INTRAVENOUS
  Administered 2020-06-09: 3 mg via INTRAVENOUS
  Administered 2020-06-09 (×3): 4 mg via INTRAVENOUS
  Filled 2020-06-07 (×6): qty 2

## 2020-06-07 MED ORDER — LORAZEPAM 1 MG PO TABS
1.0000 mg | ORAL_TABLET | ORAL | Status: AC | PRN
  Administered 2020-06-08: 3 mg via ORAL

## 2020-06-07 MED ORDER — PNEUMOCOCCAL VAC POLYVALENT 25 MCG/0.5ML IJ INJ: 0.5000 mL | INJECTION | INTRAMUSCULAR | Status: DC

## 2020-06-07 MED ORDER — MAGNESIUM SULFATE 2 GM/50ML IV SOLN
2.0000 g | Freq: Once | INTRAVENOUS | Status: AC
  Administered 2020-06-07: 2 g via INTRAVENOUS
  Filled 2020-06-07: qty 50

## 2020-06-07 NOTE — Consult Note (Signed)
Client still too "drowsy" on Precedex to assess, will try again tomorrow.  Nanine Means, PMHNP
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
## 2020-06-07 NOTE — Progress Notes (Signed)
MD contacted regarding pt's tachypnea, WBC trending up, new rhonchi in lower lobes. MD placed order for chest xray, will continue to monitor.
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
## 2020-06-07 NOTE — Consult Note (Addendum)
PHARMACY CONSULT NOTE - FOLLOW UP  Pharmacy Consult for Electrolyte Monitoring and Replacement   Recent Labs: Potassium (mmol/L)  Date Value  06/07/2020 3.9  01/08/2014 3.5   Magnesium (mg/dL)  Date Value  15/83/0940 1.7   Calcium (mg/dL)  Date Value  76/80/8811 9.2   Calcium, Total (mg/dL)  Date Value  02/01/9457 9.3   Albumin (g/dL)  Date Value  59/29/2446 4.1  01/08/2014 4.4   Phosphorus (mg/dL)  Date Value  28/63/8177 3.6   Sodium (mmol/L)  Date Value  06/07/2020 137  01/08/2014 139     Assessment: Patient is a 49 y/o M with medical history including HTN, CAD s/p CABG, EtOH abuse, tobacco abuse who is admitted with alcohol withdrawal.  Goal of Therapy:  Potassium ~4 Magnesium ~2 All other electrolytes within normal limits  Plan:  Received magnesium this morning. Patient is eating, electrolytes stable. Will discontinue pharmacy consult.  Pricilla Riffle, PharmD 06/07/2020 11:39 AM

## 2020-06-07 NOTE — Progress Notes (Signed)
Patient continuing to try and get out of bed, tremulous, unable to be redirected. MD contacted due to ativan orders being expired.
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
## 2020-06-07 NOTE — Progress Notes (Signed)
MD contacted because pressure dropped to systolic in 70s, MAP of 62 prior to any ativan and after precedex titrated down. Order received for 1L NS bolus and maintenance fluids. Will continue to monitor.

## 2020-06-08 LAB — CBC WITH DIFFERENTIAL/PLATELET
Abs Immature Granulocytes: 0.07 10*3/uL (ref 0.00–0.07)
Basophils Absolute: 0.1 10*3/uL (ref 0.0–0.1)
Basophils Relative: 1 %
Eosinophils Absolute: 0.2 10*3/uL (ref 0.0–0.5)
Eosinophils Relative: 2 %
HCT: 33.6 % — ABNORMAL LOW (ref 39.0–52.0)
Hemoglobin: 11.3 g/dL — ABNORMAL LOW (ref 13.0–17.0)
Immature Granulocytes: 1 %
Lymphocytes Relative: 16 %
Lymphs Abs: 1.7 10*3/uL (ref 0.7–4.0)
MCH: 34.6 pg — ABNORMAL HIGH (ref 26.0–34.0)
MCHC: 33.6 g/dL (ref 30.0–36.0)
MCV: 102.8 fL — ABNORMAL HIGH (ref 80.0–100.0)
Monocytes Absolute: 2.7 10*3/uL — ABNORMAL HIGH (ref 0.1–1.0)
Monocytes Relative: 26 %
Neutro Abs: 5.9 10*3/uL (ref 1.7–7.7)
Neutrophils Relative %: 54 %
Platelets: 142 10*3/uL — ABNORMAL LOW (ref 150–400)
RBC: 3.27 MIL/uL — ABNORMAL LOW (ref 4.22–5.81)
RDW: 12.5 % (ref 11.5–15.5)
WBC: 10.7 10*3/uL — ABNORMAL HIGH (ref 4.0–10.5)
nRBC: 0 % (ref 0.0–0.2)

## 2020-06-08 LAB — RENAL FUNCTION PANEL
Albumin: 3.6 g/dL (ref 3.5–5.0)
Anion gap: 6 (ref 5–15)
BUN: 8 mg/dL (ref 6–20)
CO2: 26 mmol/L (ref 22–32)
Calcium: 8.7 mg/dL — ABNORMAL LOW (ref 8.9–10.3)
Chloride: 104 mmol/L (ref 98–111)
Creatinine, Ser: 0.51 mg/dL — ABNORMAL LOW (ref 0.61–1.24)
GFR calc Af Amer: >60 mL/min (ref >60)
GFR calc non Af Amer: >60 mL/min (ref >60)
Glucose, Bld: 106 mg/dL — ABNORMAL HIGH (ref 70–99)
Phosphorus: 2.9 mg/dL (ref 2.5–4.6)
Potassium: 3.6 mmol/L (ref 3.5–5.1)
Sodium: 136 mmol/L (ref 135–145)

## 2020-06-08 LAB — MAGNESIUM: Magnesium: 1.7 mg/dL (ref 1.7–2.4)

## 2020-06-08 MED ORDER — DIAZEPAM 5 MG PO TABS
5.0000 mg | ORAL_TABLET | Freq: Four times a day (QID) | ORAL | Status: DC
  Administered 2020-06-10 – 2020-06-14 (×9): 5 mg via ORAL
  Filled 2020-06-08 (×9): qty 1
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
## 2020-06-08 NOTE — Consult Note (Signed)
Client asleep, precedex increased this am.  Will continue to visit daily to attempt to assess client.  Nanine Means, PMHNP

## 2020-06-09 ENCOUNTER — Inpatient Hospital Stay: Payer: Self-pay

## 2020-06-09 DIAGNOSIS — J9601 Acute respiratory failure with hypoxia: Secondary | ICD-10-CM

## 2020-06-09 DIAGNOSIS — R45850 Homicidal ideations: Secondary | ICD-10-CM

## 2020-06-09 DIAGNOSIS — F1010 Alcohol abuse, uncomplicated: Secondary | ICD-10-CM

## 2020-06-09 LAB — CBC WITH DIFFERENTIAL/PLATELET
Abs Immature Granulocytes: 0.02 10*3/uL (ref 0.00–0.07)
Basophils Absolute: 0.1 10*3/uL (ref 0.0–0.1)
Basophils Relative: 1 %
Eosinophils Absolute: 0.3 10*3/uL (ref 0.0–0.5)
Eosinophils Relative: 3 %
HCT: 35.3 % — ABNORMAL LOW (ref 39.0–52.0)
Hemoglobin: 11.6 g/dL — ABNORMAL LOW (ref 13.0–17.0)
Immature Granulocytes: 0 %
Lymphocytes Relative: 18 %
Lymphs Abs: 1.3 10*3/uL (ref 0.7–4.0)
MCH: 33.8 pg (ref 26.0–34.0)
MCHC: 32.9 g/dL (ref 30.0–36.0)
MCV: 102.9 fL — ABNORMAL HIGH (ref 80.0–100.0)
Monocytes Absolute: 1.9 10*3/uL — ABNORMAL HIGH (ref 0.1–1.0)
Monocytes Relative: 25 %
Neutro Abs: 3.9 10*3/uL (ref 1.7–7.7)
Neutrophils Relative %: 53 %
Platelets: 173 10*3/uL (ref 150–400)
RBC: 3.43 MIL/uL — ABNORMAL LOW (ref 4.22–5.81)
RDW: 12.5 % (ref 11.5–15.5)
WBC: 7.5 10*3/uL (ref 4.0–10.5)
nRBC: 0 % (ref 0.0–0.2)

## 2020-06-09 LAB — RENAL FUNCTION PANEL
Albumin: 3.3 g/dL — ABNORMAL LOW (ref 3.5–5.0)
Anion gap: 6 (ref 5–15)
BUN: 6 mg/dL (ref 6–20)
CO2: 27 mmol/L (ref 22–32)
Calcium: 8.7 mg/dL — ABNORMAL LOW (ref 8.9–10.3)
Chloride: 104 mmol/L (ref 98–111)
Creatinine, Ser: 0.61 mg/dL (ref 0.61–1.24)
GFR calc Af Amer: >60 mL/min (ref >60)
GFR calc non Af Amer: >60 mL/min (ref >60)
Glucose, Bld: 119 mg/dL — ABNORMAL HIGH (ref 70–99)
Phosphorus: 2.8 mg/dL (ref 2.5–4.6)
Potassium: 3.5 mmol/L (ref 3.5–5.1)
Sodium: 137 mmol/L (ref 135–145)

## 2020-06-09 LAB — BLOOD GAS, ARTERIAL
Acid-Base Excess: 1.2 mmol/L (ref 0.0–2.0)
Bicarbonate: 25.1 mmol/L (ref 20.0–28.0)
FIO2: 0.28
MECHVT: 550 mL
O2 Saturation: 86.5 %
PEEP: 5 cmH2O
Patient temperature: 37
RATE: 15 resp/min
pCO2 arterial: 37 mmHg (ref 32.0–48.0)
pH, Arterial: 7.44 (ref 7.350–7.450)
pO2, Arterial: 50 mmHg — ABNORMAL LOW (ref 83.0–108.0)

## 2020-06-09 LAB — GLUCOSE, CAPILLARY: Glucose-Capillary: 88 mg/dL (ref 70–99)

## 2020-06-09 LAB — MAGNESIUM: Magnesium: 1.7 mg/dL (ref 1.7–2.4)

## 2020-06-09 LAB — TRIGLYCERIDES: Triglycerides: 116 mg/dL (ref <150)

## 2020-06-09 MED ORDER — VECURONIUM BROMIDE 10 MG IV SOLR
INTRAVENOUS | Status: AC
  Administered 2020-06-09: 10 mg via INTRAVENOUS
  Filled 2020-06-09: qty 10

## 2020-06-09 MED ORDER — ORAL CARE MOUTH RINSE
15.0000 mL | OROMUCOSAL | Status: DC
  Administered 2020-06-09 – 2020-06-11 (×16): 15 mL via OROMUCOSAL

## 2020-06-09 MED ORDER — FENTANYL CITRATE (PF) 100 MCG/2ML IJ SOLN
INTRAMUSCULAR | Status: AC
  Administered 2020-06-09: 100 ug via INTRAVENOUS
  Filled 2020-06-09: qty 2

## 2020-06-09 MED ORDER — MIDAZOLAM HCL 2 MG/2ML IJ SOLN
INTRAMUSCULAR | Status: AC
  Administered 2020-06-09: 4 mg via INTRAVENOUS
  Filled 2020-06-09: qty 4

## 2020-06-09 MED ORDER — PANTOPRAZOLE SODIUM 40 MG IV SOLR
40.0000 mg | Freq: Every day | INTRAVENOUS | Status: DC
  Administered 2020-06-09 – 2020-06-12 (×3): 40 mg via INTRAVENOUS
  Filled 2020-06-09 (×3): qty 40

## 2020-06-09 MED ORDER — POTASSIUM CHLORIDE 10 MEQ/100ML IV SOLN
10.0000 meq | INTRAVENOUS | Status: AC
  Administered 2020-06-09 (×4): 10 meq via INTRAVENOUS
  Filled 2020-06-09 (×4): qty 100

## 2020-06-09 MED ORDER — VECURONIUM BROMIDE 10 MG IV SOLR: 10.0000 mg | Freq: Once | INTRAVENOUS | Status: AC

## 2020-06-09 MED ORDER — DEXTROSE IN LACTATED RINGERS 5 % IV SOLN: INTRAVENOUS | Status: DC

## 2020-06-09 MED ORDER — MAGNESIUM SULFATE 2 GM/50ML IV SOLN
2.0000 g | Freq: Once | INTRAVENOUS | Status: AC
  Administered 2020-06-09: 2 g via INTRAVENOUS
  Filled 2020-06-09: qty 50

## 2020-06-09 MED ORDER — PROPOFOL 1000 MG/100ML IV EMUL: 5.0000 ug/kg/min | INTRAVENOUS | Status: DC

## 2020-06-09 MED ORDER — PROPOFOL 1000 MG/100ML IV EMUL
INTRAVENOUS | Status: AC
  Administered 2020-06-09: 10 ug/kg/min via INTRAVENOUS
  Filled 2020-06-09: qty 100

## 2020-06-09 MED ORDER — CHLORHEXIDINE GLUCONATE 0.12% ORAL RINSE (MEDLINE KIT)
15.0000 mL | Freq: Two times a day (BID) | OROMUCOSAL | Status: DC
  Administered 2020-06-09 – 2020-06-11 (×4): 15 mL via OROMUCOSAL

## 2020-06-09 MED ORDER — VECURONIUM BROMIDE 10 MG IV SOLR
10.0000 mg | INTRAVENOUS | Status: DC | PRN
  Administered 2020-06-09: 10 mg via INTRAVENOUS

## 2020-06-09 MED ORDER — FENTANYL CITRATE (PF) 100 MCG/2ML IJ SOLN: 100.0000 ug | Freq: Once | INTRAMUSCULAR | Status: AC

## 2020-06-09 MED ORDER — FENTANYL 2500MCG IN NS 250ML (10MCG/ML) PREMIX INFUSION
INTRAVENOUS | Status: AC
  Administered 2020-06-09: 100 ug/h via INTRAVENOUS
  Filled 2020-06-09: qty 250

## 2020-06-09 MED ORDER — SODIUM CHLORIDE 0.9 % IV SOLN
250.0000 mL | INTRAVENOUS | Status: DC
  Administered 2020-06-09 – 2020-06-11 (×2): 250 mL via INTRAVENOUS

## 2020-06-09 MED ORDER — FENTANYL 2500MCG IN NS 250ML (10MCG/ML) PREMIX INFUSION
50.0000 ug/h | INTRAVENOUS | Status: DC
  Administered 2020-06-09 (×2): 300 ug/h via INTRAVENOUS
  Administered 2020-06-10: 400 ug/h via INTRAVENOUS
  Administered 2020-06-10 (×2): 350 ug/h via INTRAVENOUS
  Administered 2020-06-11: 280 ug/h via INTRAVENOUS
  Filled 2020-06-09 (×5): qty 250

## 2020-06-09 MED ORDER — FENTANYL BOLUS VIA INFUSION
50.0000 ug | INTRAVENOUS | Status: DC | PRN
  Administered 2020-06-10: 50 ug via INTRAVENOUS
  Filled 2020-06-09: qty 50

## 2020-06-09 MED ORDER — NOREPINEPHRINE 4 MG/250ML-% IV SOLN: 2.0000 ug/min | INTRAVENOUS | Status: DC

## 2020-06-09 MED ORDER — SODIUM CHLORIDE 0.9 % IV SOLN
3.0000 g | Freq: Four times a day (QID) | INTRAVENOUS | Status: DC
  Administered 2020-06-09 – 2020-06-13 (×16): 3 g via INTRAVENOUS
  Filled 2020-06-09 (×8): qty 3
  Filled 2020-06-09: qty 8
  Filled 2020-06-09 (×4): qty 3
  Filled 2020-06-09: qty 8
  Filled 2020-06-09 (×4): qty 3

## 2020-06-09 MED ORDER — MIDAZOLAM HCL 2 MG/2ML IJ SOLN: 4.0000 mg | Freq: Once | INTRAMUSCULAR | Status: AC
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
## 2020-06-09 NOTE — Consult Note (Signed)
Patient seen and evaluated in person by this provider.  He was agitated earlier this morning and given sedation.  Unable to assess, will try again tomorrow.  Nanine Means, PMHNP

## 2020-06-09 NOTE — Procedures (Signed)
Endotracheal Intubation: Patient required placement of an artificial airway secondary to Respiratory Failure  Consent: Emergent.   Hand washing performed prior to starting the procedure.   Medications administered for sedation prior to procedure:  Midazolam 4 mg IV,  Vecuronium 10 mg IV, Fentanyl 100 mcg IV.    A time out procedure was called and correct patient, name, & ID confirmed. Needed supplies and equipment were assembled and checked to include ETT, 10 ml syringe, Glidescope, Mac and Miller blades, suction, oxygen and bag mask valve, end tidal CO2 monitor.   Patient was positioned to align the mouth and pharynx to facilitate visualization of the glottis.   Heart rate, SpO2 and blood pressure was continuously monitored during the procedure. Pre-oxygenation was conducted prior to intubation and endotracheal tube was placed through the vocal cords into the trachea.     The artificial airway was placed under direct visualization via glidescope route using a 8.0 ETT on the first attempt.  ETT was secured at 23 cm mark.  Placement was confirmed by auscuitation of lungs with good breath sounds bilaterally and no stomach sounds.  Condensation was noted on endotracheal tube.   Pulse ox 98%.  CO2 detector in place with appropriate color change.   Complications: None .   Operator: Kamy Poinsett.   Chest radiograph ordered and pending.   Comments: OGT placed via glidescope.  Swan Fairfax David Jeramine Delis, M.D.  [facility]  Medical Director ICU-[facility] Medical Director [facility]io[facility] Department       

## 2020-06-09 NOTE — Progress Notes (Signed)
eLink Physician-Brief Progress Note Patient Name: Jason Atkins DOB: 29-Mar-1971 MRN: [id number]   Date of Service  06/09/2020  HPI/Events of Note  Notified of need for stress ulcer prophylaxis in intubated/ventilated patient.   eICU Interventions  Plan: 1. Protonix 40 mg IV now and Q day.      Intervention Category Intermediate Interventions: Best-practice therapies (e.g. DVT, beta blocker, etc.)  Analisse Randle Eugene 06/09/2020, 11:11 PM

## 2020-06-10 LAB — CBC WITH DIFFERENTIAL/PLATELET
Abs Immature Granulocytes: 0.01 10*3/uL (ref 0.00–0.07)
Basophils Absolute: 0.1 10*3/uL (ref 0.0–0.1)
Basophils Relative: 1 %
Eosinophils Absolute: 0.2 10*3/uL (ref 0.0–0.5)
Eosinophils Relative: 3 %
HCT: 33.4 % — ABNORMAL LOW (ref 39.0–52.0)
Hemoglobin: 11.8 g/dL — ABNORMAL LOW (ref 13.0–17.0)
Immature Granulocytes: 0 %
Lymphocytes Relative: 20 %
Lymphs Abs: 1.3 10*3/uL (ref 0.7–4.0)
MCH: 35.2 pg — ABNORMAL HIGH (ref 26.0–34.0)
MCHC: 35.3 g/dL (ref 30.0–36.0)
MCV: 99.7 fL (ref 80.0–100.0)
Monocytes Absolute: 1.8 10*3/uL — ABNORMAL HIGH (ref 0.1–1.0)
Monocytes Relative: 26 %
Neutro Abs: 3.3 10*3/uL (ref 1.7–7.7)
Neutrophils Relative %: 50 %
Platelets: 215 10*3/uL (ref 150–400)
RBC: 3.35 MIL/uL — ABNORMAL LOW (ref 4.22–5.81)
RDW: 12.3 % (ref 11.5–15.5)
WBC: 6.7 10*3/uL (ref 4.0–10.5)
nRBC: 0 % (ref 0.0–0.2)

## 2020-06-10 LAB — GLUCOSE, CAPILLARY
Glucose-Capillary: 109 mg/dL — ABNORMAL HIGH (ref 70–99)
Glucose-Capillary: 112 mg/dL — ABNORMAL HIGH (ref 70–99)
Glucose-Capillary: 114 mg/dL — ABNORMAL HIGH (ref 70–99)

## 2020-06-10 LAB — RENAL FUNCTION PANEL
Albumin: 3.2 g/dL — ABNORMAL LOW (ref 3.5–5.0)
Anion gap: 9 (ref 5–15)
BUN: 6 mg/dL (ref 6–20)
CO2: 26 mmol/L (ref 22–32)
Calcium: 8.5 mg/dL — ABNORMAL LOW (ref 8.9–10.3)
Chloride: 104 mmol/L (ref 98–111)
Creatinine, Ser: 0.58 mg/dL — ABNORMAL LOW (ref 0.61–1.24)
GFR calc Af Amer: >60 mL/min (ref >60)
GFR calc non Af Amer: >60 mL/min (ref >60)
Glucose, Bld: 98 mg/dL (ref 70–99)
Phosphorus: 2.9 mg/dL (ref 2.5–4.6)
Potassium: 3.4 mmol/L — ABNORMAL LOW (ref 3.5–5.1)
Sodium: 139 mmol/L (ref 135–145)

## 2020-06-10 LAB — MAGNESIUM: Magnesium: 1.7 mg/dL (ref 1.7–2.4)

## 2020-06-10 MED ORDER — VITAL 1.5 CAL PO LIQD
1000.0000 mL | ORAL | Status: DC
  Administered 2020-06-10: 1000 mL

## 2020-06-10 MED ORDER — VITAL HIGH PROTEIN PO LIQD: 1000.0000 mL | ORAL | Status: DC

## 2020-06-10 MED ORDER — POTASSIUM CHLORIDE 20 MEQ PO PACK
40.0000 meq | PACK | ORAL | Status: AC
  Administered 2020-06-10 (×2): 40 meq
  Filled 2020-06-10: qty 2

## 2020-06-10 MED ORDER — MAGNESIUM SULFATE 2 GM/50ML IV SOLN
2.0000 g | Freq: Once | INTRAVENOUS | Status: AC
  Administered 2020-06-10: 2 g via INTRAVENOUS
  Filled 2020-06-10: qty 50

## 2020-06-10 NOTE — Progress Notes (Signed)
PHARMACY CONSULT NOTE - FOLLOW UP  Pharmacy Consult for Electrolyte Monitoring and Replacement   Recent Labs: Potassium (mmol/L)  Date Value  06/10/2020 3.4 (L)  01/08/2014 3.5   Magnesium (mg/dL)  Date Value  34/19/6222 1.7   Calcium (mg/dL)  Date Value  97/98/9211 8.5 (L)   Calcium, Total (mg/dL)  Date Value  94/17/4081 9.3   Albumin (g/dL)  Date Value  44/81/8563 3.2 (L)  01/08/2014 4.4   Phosphorus (mg/dL)  Date Value  14/97/0263 2.9   Sodium (mmol/L)  Date Value  06/10/2020 139  01/08/2014 139     Assessment: 49 year old male admitted post-fall 7/13. Per history, drinking 25-30 beers daily. Patient has been in the ICU on Precedex drip, Librium and CIWA protocol. Patient required intubation 7/21 for airway protection. Sedated with fentanyl and Precedex.  Goal of Therapy:  Electrolytes WNL  Plan:  Potassium 40 mEq per tube x 2 doses + mag 2 g IV x 1. Decreased oral intake, currently without any nutrition via tube but on D5LR at 75 ml/hr. Follow up electrolytes with morning labs.  Pricilla Riffle ,PharmD Clinical Pharmacist 06/10/2020 11:16 AM
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
## 2020-06-10 NOTE — Consult Note (Signed)
Patient intubated at this time, will continue to attempt to assess him on a daily basis.  Nanine Means, PMHNP

## 2020-06-10 NOTE — Progress Notes (Signed)
Initial Nutrition Assessment  DOCUMENTATION CODES:   Severe malnutrition in context of social or environmental circumstances  INTERVENTION:  Initiate Vital 1.5 Cal at trickle rate of 20 mL/hr.  If patient tolerates recommend advancing by 15 mL/hr every 12 hours to goal regimen of Vital 1.5 Cal at 50 mL/hr (1200 mL goal daily volume). Also provide Pro-Stat 30 mL BID per tube. Goal regimen provides 2000 kcal, 111 grams of protein, 912 mL H2O daily.  Continue MVI daily, thiamine 100 mg daily, and folic acid 1 mg daily.   Monitor magnesium, potassium, and phosphorus daily for at least 3 days, MD to replete as needed, as pt is at risk for refeeding syndrome given severe malnutrition.  NUTRITION DIAGNOSIS:   Severe Malnutrition related to social / environmental circumstances (EtOH abuse, inadequate oral intake) as evidenced by moderate fat depletion, severe muscle depletion, 18% weight loss over 7 months.  GOAL:   Patient will meet greater than or equal to 90% of their needs  MONITOR:   Vent status, Labs, Weight trends, TF tolerance, I & O's  REASON FOR ASSESSMENT:   Ventilator, Consult Enteral/tube feeding initiation and management  ASSESSMENT:   49 year old male with PMHx of CAD s/p CABG, HTN, EtOH abuse, bipolar disorder admitted with severe alcohol withdrawal with homicidal ideations and required intubation on 7/21.   Per chart patient drinks approximately 25-30 beers per day. Patient's wife was at bedside at time of RD assessment. She reports patient has not been eating well for a while now. He was only taking a few bites at meals and sometimes would go several days between trying to eat a meal. She reports he has been losing weight over time. According to chart patient was 81.6 kg on 11/17/2019. He is now 66.9 kg (147.49 lbs). He has lost 14.7 kg (18% body weight) over the past 7 months, which is significant for time frame.  Patient is currently intubated on ventilator  support MV: 9 L/min Temp (24hrs), Avg:99.2 F (37.3 C), Min:98.1 F (36.7 C), Max:100.9 F (38.3 C)  Medications reviewed and include: folic acid 1 mg daily, MVI daily, nicotine patch, Protonix, thiamine 100 mg daily, Unasyn, Precedex gtt, D5LR at 75 mL/hr, fentanyl gtt.  Labs reviewed: CBG 88, Potassium 3.4, Creatinine 0.58.  I/O: 3100 mL UOP yesterday (1.9 mL/kg/hr) + 1 occurrence unmeasured UOP  Enteral Access: OGT placed 7/21; tube tip and side port below diaphragm per abdominal x-ray 7/21  Discussed with RN and on rounds. Plan is to start trickle feeds today.  NUTRITION - FOCUSED PHYSICAL EXAM:    Most Recent Value  Orbital Region Moderate depletion  Upper Arm Region Moderate depletion  Thoracic and Lumbar Region Mild depletion  Buccal Region Unable to assess  Temple Region Severe depletion  Clavicle Bone Region Severe depletion  Clavicle and Acromion Bone Region Severe depletion  Scapular Bone Region Unable to assess  Dorsal Hand Mild depletion  Patellar Region Severe depletion  Anterior Thigh Region Severe depletion  Posterior Calf Region Severe depletion  Edema (RD Assessment) None  Hair Reviewed  Eyes Unable to assess  Mouth Unable to assess  Skin Reviewed  Nails Reviewed     Diet Order:   Diet Order            Diet NPO time specified  Diet effective now                EDUCATION NEEDS:   No education needs have been identified at this time  Skin:  Skin Assessment: Reviewed RN Assessment  Last BM:  06/04/2020 per chart  Height:   Ht Readings from Last 1 Encounters:  06/09/20 5\' 11"  (1.803 m)   Weight:   Wt Readings from Last 1 Encounters:  06/03/20 66.9 kg   Ideal Body Weight:  78.2 kg  BMI:  Body mass index is 20.57 kg/m.  Estimated Nutritional Needs:   Kcal:  1965 (PSU 2003b w/ MSJ 1564, Ve 9, Tmax 38.3)  Protein:  100-115 grams  Fluid:  2-2.3 L/day  06/05/20, MS, RD, LDN Pager number available on Amion

## 2020-06-10 NOTE — Plan of Care (Signed)
?  Problem: Clinical Measurements: ?Goal: Respiratory complications will improve ?Outcome: Progressing ?Goal: Cardiovascular complication will be avoided ?Outcome: Progressing ?  ?Problem: Activity: ?Goal: Risk for activity intolerance will decrease ?Outcome: Progressing ?  ?Problem: Pain Managment: ?Goal: General experience of comfort will improve ?Outcome: Progressing ?  ?Problem: Safety: ?Goal: Ability to remain free from injury will improve ?Outcome: Progressing ?  ?

## 2020-06-10 NOTE — Progress Notes (Signed)
Patient has rectal temperature 101.2, this RN initiated cooling blanket to reduce fever.  All other vital signs within norma limits for patient, patient is currently sedated/intubated, and not showing any signs of distress. Appears to not be in distress at this distress. Will continue to monitor.

## 2020-06-11 DIAGNOSIS — E43 Unspecified severe protein-calorie malnutrition: Secondary | ICD-10-CM | POA: Insufficient documentation

## 2020-06-11 LAB — CBC WITH DIFFERENTIAL/PLATELET
Abs Immature Granulocytes: 0.04 10*3/uL (ref 0.00–0.07)
Basophils Absolute: 0.1 10*3/uL (ref 0.0–0.1)
Basophils Relative: 1 %
Eosinophils Absolute: 0.3 10*3/uL (ref 0.0–0.5)
Eosinophils Relative: 3 %
HCT: 37.1 % — ABNORMAL LOW (ref 39.0–52.0)
Hemoglobin: 12.5 g/dL — ABNORMAL LOW (ref 13.0–17.0)
Immature Granulocytes: 0 %
Lymphocytes Relative: 22 %
Lymphs Abs: 2 10*3/uL (ref 0.7–4.0)
MCH: 34.6 pg — ABNORMAL HIGH (ref 26.0–34.0)
MCHC: 33.7 g/dL (ref 30.0–36.0)
MCV: 102.8 fL — ABNORMAL HIGH (ref 80.0–100.0)
Monocytes Absolute: 2.3 10*3/uL — ABNORMAL HIGH (ref 0.1–1.0)
Monocytes Relative: 25 %
Neutro Abs: 4.4 10*3/uL (ref 1.7–7.7)
Neutrophils Relative %: 49 %
Platelets: 272 10*3/uL (ref 150–400)
RBC: 3.61 MIL/uL — ABNORMAL LOW (ref 4.22–5.81)
RDW: 12.2 % (ref 11.5–15.5)
WBC: 9.1 10*3/uL (ref 4.0–10.5)
nRBC: 0 % (ref 0.0–0.2)

## 2020-06-11 LAB — RENAL FUNCTION PANEL
Albumin: 3.2 g/dL — ABNORMAL LOW (ref 3.5–5.0)
Anion gap: 7 (ref 5–15)
BUN: <5 mg/dL — ABNORMAL LOW (ref 6–20)
CO2: 28 mmol/L (ref 22–32)
Calcium: 8.6 mg/dL — ABNORMAL LOW (ref 8.9–10.3)
Chloride: 103 mmol/L (ref 98–111)
Creatinine, Ser: 0.59 mg/dL — ABNORMAL LOW (ref 0.61–1.24)
GFR calc Af Amer: >60 mL/min (ref >60)
GFR calc non Af Amer: >60 mL/min (ref >60)
Glucose, Bld: 111 mg/dL — ABNORMAL HIGH (ref 70–99)
Phosphorus: 2.2 mg/dL — ABNORMAL LOW (ref 2.5–4.6)
Potassium: 3.6 mmol/L (ref 3.5–5.1)
Sodium: 138 mmol/L (ref 135–145)

## 2020-06-11 LAB — GLUCOSE, CAPILLARY
Glucose-Capillary: 101 mg/dL — ABNORMAL HIGH (ref 70–99)
Glucose-Capillary: 115 mg/dL — ABNORMAL HIGH (ref 70–99)
Glucose-Capillary: 96 mg/dL (ref 70–99)
Glucose-Capillary: 94 mg/dL (ref 70–99)

## 2020-06-11 LAB — MAGNESIUM: Magnesium: 1.6 mg/dL — ABNORMAL LOW (ref 1.7–2.4)

## 2020-06-11 MED ORDER — DEXTROSE 10 % IV SOLN: INTRAVENOUS | Status: DC

## 2020-06-11 MED ORDER — POTASSIUM PHOSPHATES 15 MMOLE/5ML IV SOLN
20.0000 mmol | Freq: Once | INTRAVENOUS | Status: AC
  Administered 2020-06-11: 20 mmol via INTRAVENOUS
  Filled 2020-06-11: qty 6.67

## 2020-06-11 MED ORDER — MAGNESIUM SULFATE 2 GM/50ML IV SOLN
2.0000 g | Freq: Once | INTRAVENOUS | Status: AC
  Administered 2020-06-11: 2 g via INTRAVENOUS
  Filled 2020-06-11: qty 50

## 2020-06-11 NOTE — Progress Notes (Signed)
Attempted to wean pt off fentanyl gtt,  pt very easily agitated, attempting to get of bed, kicking his leg and hitting staff with fits. Fentanyl was increased back to 493mcq/hr. Pt still on precedex gtt.

## 2020-06-11 NOTE — Progress Notes (Signed)
MD notified that pt had 10 beat run of vtach. No new orders received. Will continue to monitor.

## 2020-06-11 NOTE — Evaluation (Addendum)
Clinical/Bedside Swallow Evaluation Patient Details  Name: Jason Atkins MRN: [id number] Date of Birth: Mar 27, 1971  Today's Date: 06/11/2020 Time: SLP Start Time (ACUTE ONLY): 1105 SLP Stop Time (ACUTE ONLY): 1125 SLP Time Calculation (min) (ACUTE ONLY): 20 min  Past Medical History:  Past Medical History:  Diagnosis Date  . Alcohol abuse   . Bipolar disorder (HCC)   . Coronary artery disease   . Heart attack (HCC)    x4  . Hx of CABG   . Hypertension   . Tobacco abuse    Past Surgical History:  Past Surgical History:  Procedure Laterality Date  . CARDIAC SURGERY    . CORONARY ARTERY BYPASS GRAFT     HPI:  49yo male admitted 06/09/20 with alcohol withdrawal, homicidal ideation. PMH: HTN, CAD, CABG, alcohol and tobacco abuse, hepatic encephalopathy. Pt intubated 7/21-23/21 - self extubated   Assessment / Plan / Recommendation Clinical Impression  Pt seen at bedside for clinical swallow evaluation to determine appropriateness for po intake s/p self extubation this morning. Unable to perform full CN exam due to pt having difficulty following commands consistently. Pt presents with missing dentition in poor condition. Oral care was completed with suction. Slightly blood tinged secretions noted on swab. Pt exhibits wet voice quality, indicating poor secretion management. Volitional throat clear and cough were profoundly weak and ineffective. Following oral care, pt was given individual ice chips. Poor oral manipulation and audible swallow noted. Wet voice quality continued to be noted with nonproductive congested cough response after the swallow. No further PO trials were given at this time, due to high risk of aspiration and poor management of secretions. Recommend continuing with strict NPO status, including meds. SLP will continue to follow at bedside to assess readiness for po intake. Pt is currently not supported nutritionally. RN and MD informed.   SLP Visit Diagnosis: Dysphagia,  unspecified (R13.10)    Aspiration Risk  Severe aspiration risk;Risk for inadequate nutrition/hydration    Diet Recommendation NPO   Medication Administration: Via alternative means    Other  Recommendations Oral Care Recommendations: Oral care QID Other Recommendations: Have oral suction available   Follow up Recommendations Other (comment) (tbd)      Frequency and Duration min 2x/week  1 week;2 weeks       Prognosis Prognosis for Safe Diet Advancement: Fair Barriers to Reach Goals: Cognitive deficits      Swallow Study   General Date of Onset: 06/03/20 HPI: 49yo male admitted 06/09/20 with alcohol withdrawal, homicidal ideation. PMH: HTN, CAD, CABG, alcohol and tobacco abuse, hepatic encephalopathy. Pt intubated 7/21-23/21 - self extubated Type of Study: Bedside Swallow Evaluation Previous Swallow Assessment: none Diet Prior to this Study: NPO Temperature Spikes Noted: No Respiratory Status: Room air History of Recent Intubation: Yes Length of Intubations (days): 2 days Date extubated: 06/11/20 (self extubated) Behavior/Cognition: Confused;Uncooperative;Requires cueing;Distractible;Doesn't follow directions;Lethargic/Drowsy Oral Cavity Assessment: Other (comment) (slightly blood tinged) Oral Care Completed by SLP: Yes Oral Cavity - Dentition: Poor condition Vision: Impaired for self-feeding Self-Feeding Abilities: Total assist Patient Positioning: Upright in bed Baseline Vocal Quality: Wet Volitional Cough: Weak;Congested Volitional Swallow: Unable to elicit    Oral/Motor/Sensory Function Overall Oral Motor/Sensory Function: Generalized oral weakness   Ice Chips Ice chips: Impaired Presentation: Spoon Oral Phase Impairments: Poor awareness of bolus;Reduced labial seal;Reduced lingual movement/coordination;Impaired mastication Oral Phase Functional Implications: Prolonged oral transit Pharyngeal Phase Impairments: Suspected delayed Swallow;Cough - Immediate;Wet  Vocal Quality   Thin Liquid Thin Liquid: Not tested    Nectar Thick  Nectar Thick Liquid: Not tested   Honey Thick Honey Thick Liquid: Not tested   Puree Puree: Not tested   Solid     Solid: Not tested     Diquan Kassis B. Murvin Natal, [facility], CCC-SLP Speech Language Pathologist  Leigh Aurora 06/11/2020,11:37 AM

## 2020-06-11 NOTE — Progress Notes (Signed)
Pt very agitated and stood straight up in bed, attempting to getting out of bed and self extubated himself. MD was in the unit, pt was bale to open eyes and coughed effectively. Pt was placed on 4L/[place] and oxygen saturation marinating around 95-100%. Fentanyl gtt turned off and pt continue on precedex gtt, pt was able communicate and stated ' thank you". Pt reoriented to place and call light and bed alarm turned on.

## 2020-06-11 NOTE — Progress Notes (Signed)
PHARMACY CONSULT NOTE - FOLLOW UP  Pharmacy Consult for Electrolyte Monitoring and Replacement   Recent Labs: Potassium (mmol/L)  Date Value  06/11/2020 3.6  01/08/2014 3.5   Magnesium (mg/dL)  Date Value  67/11/4101 1.6 (L)   Calcium (mg/dL)  Date Value  12/20/4386 8.6 (L)   Calcium, Total (mg/dL)  Date Value  87/57/9728 9.3   Albumin (g/dL)  Date Value  20/60/1561 3.2 (L)  01/08/2014 4.4   Phosphorus (mg/dL)  Date Value  53/79/4327 2.2 (L)   Sodium (mmol/L)  Date Value  06/11/2020 138  01/08/2014 139     Assessment: 49 year old male admitted post-fall 7/13. Per history, drinking 25-30 beers daily. Patient has been in the ICU on Precedex drip, Librium and CIWA protocol. Patient required intubation 7/21 for airway protection. Sedated with fentanyl and Precedex.  Patient self-extubated 7/23 morning.  Goal of Therapy:  Electrolytes WNL  Plan:  Received mag 2 g IV x 1 this morning. Order for potassium phos 20 mmol IV x 1. Patient remains NPO pending swallow eval. Will follow up electrolytes with morning labs.  Pricilla Riffle ,PharmD Clinical Pharmacist 06/11/2020 11:19 AM
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
## 2020-06-11 NOTE — Progress Notes (Signed)
Nutrition Follow-up  DOCUMENTATION CODES:   Severe malnutrition in context of social or environmental circumstances  INTERVENTION:  Will monitor for diet advancement. Patient will benefit from oral nutrition supplements once diet able to be advanced.  If diet is unable to be advanced in the next 1-2 days recommend placement of small-bore feeding tube (10 Fr. Dobbhoff) for initiation of tube feeds. If plan is for tube feeds recommend: -Initiate Osmolite 1.5 Cal at 20 mL/hr and advance by 15 mL/hr every 12 hours to goal rate of 50 mL/hr (1200 mL goal daily volume) per tube -Pro-Stat 30 mL po BID per tube -Provides 2000 kcal, 105 grams of protein, 912 mL H2O daily  Monitor magnesium, potassium, and phosphorus daily for at least 3 days, MD to replete as needed, as pt is at risk for refeeding syndrome given severe malnutrition.  NUTRITION DIAGNOSIS:   Severe Malnutrition related to social / environmental circumstances (EtOH abuse, inadequate oral intake) as evidenced by moderate fat depletion, severe muscle depletion, percent weight loss.  Ongoing.  GOAL:   Patient will meet greater than or equal to 90% of their needs  Not met.  MONITOR:   Vent status, Labs, Weight trends, TF tolerance, I & O's  REASON FOR ASSESSMENT:   Ventilator, Consult Enteral/tube feeding initiation and management  ASSESSMENT:   49 year old male with PMHx of CAD s/p CABG, HTN, EtOH abuse, bipolar disorder admitted with severe alcohol withdrawal with homicidal ideations and required intubation on 7/21.  -Patient self-extubated early this AM.  Assessed patient at bedside but he was sleeping. Currently on Precedex gtt and safety sitter at bedside. Patient was assessed by SLP but he is not currently alert enough for PO intake. Discussed with RN and in rounds. Plan is for medication adjustments to try to come down on Precedex gtt. Will monitor for diet advancement.  Medications reviewed and include: folic acid  1 mg daily, MVI daily, nicotine patch, Protonix, thiamine 100 mg daily, NS at 125 mL/hr,  Unasyn, Precedex gtt at 1.2 mcg/kg/hr, D5 in LR at 75 mL/hr, potassium phosphate 20 mmol IV once today.  Labs reviewed: CBG 101-115, BUN <5, Creatinine 0.59, Phosphorus 2.2, Magnesium 1.6.  Diet Order:   Diet Order            Diet NPO time specified  Diet effective now                EDUCATION NEEDS:   No education needs have been identified at this time  Skin:  Skin Assessment: Reviewed RN Assessment  Last BM:  06/04/2020 per chart  Height:   Ht Readings from Last 1 Encounters:  06/09/20 '5\' 11"'  (1.803 m)   Weight:   Wt Readings from Last 1 Encounters:  06/03/20 66.9 kg   Ideal Body Weight:  78.2 kg  BMI:  Body mass index is 20.57 kg/m.  Estimated Nutritional Needs:   Kcal:  2000-2200  Protein:  100-115 grams  Fluid:  2-2.3 L/day  Jacklynn Barnacle, MS, RD, LDN Pager number available on Amion

## 2020-06-11 NOTE — Progress Notes (Signed)
PCCM X-COVER  The patient self-extubated but was able to maintain sat >88% on simple cannula The patient was confused but able to follow commands Fentanyl discontinued, precedex weaned Will trial [place] and not re-intubate at the moment  //Maisen Klingler

## 2020-06-11 NOTE — Consult Note (Signed)
Client extubated himself, confused,unable to access psychiatrically at this time, will continue to monitor and assess when he is able to talk coherently.  Nanine Means, PMHNP

## 2020-06-12 LAB — CBC WITH DIFFERENTIAL/PLATELET
Abs Immature Granulocytes: 0.05 10*3/uL (ref 0.00–0.07)
Basophils Absolute: 0.1 10*3/uL (ref 0.0–0.1)
Basophils Relative: 1 %
Eosinophils Absolute: 0.3 10*3/uL (ref 0.0–0.5)
Eosinophils Relative: 3 %
HCT: 33.4 % — ABNORMAL LOW (ref 39.0–52.0)
Hemoglobin: 11.8 g/dL — ABNORMAL LOW (ref 13.0–17.0)
Immature Granulocytes: 1 %
Lymphocytes Relative: 15 %
Lymphs Abs: 1.5 10*3/uL (ref 0.7–4.0)
MCH: 34.5 pg — ABNORMAL HIGH (ref 26.0–34.0)
MCHC: 35.3 g/dL (ref 30.0–36.0)
MCV: 97.7 fL (ref 80.0–100.0)
Monocytes Absolute: 1.7 10*3/uL — ABNORMAL HIGH (ref 0.1–1.0)
Monocytes Relative: 17 %
Neutro Abs: 6.5 10*3/uL (ref 1.7–7.7)
Neutrophils Relative %: 63 %
Platelets: 331 10*3/uL (ref 150–400)
RBC: 3.42 MIL/uL — ABNORMAL LOW (ref 4.22–5.81)
RDW: 12.2 % (ref 11.5–15.5)
WBC: 10.2 10*3/uL (ref 4.0–10.5)
nRBC: 0 % (ref 0.0–0.2)

## 2020-06-12 LAB — GLUCOSE, CAPILLARY
Glucose-Capillary: 94 mg/dL (ref 70–99)
Glucose-Capillary: 102 mg/dL — ABNORMAL HIGH (ref 70–99)
Glucose-Capillary: 87 mg/dL (ref 70–99)
Glucose-Capillary: 102 mg/dL — ABNORMAL HIGH (ref 70–99)
Glucose-Capillary: 86 mg/dL (ref 70–99)
Glucose-Capillary: 113 mg/dL — ABNORMAL HIGH (ref 70–99)

## 2020-06-12 LAB — RENAL FUNCTION PANEL
Albumin: 3.3 g/dL — ABNORMAL LOW (ref 3.5–5.0)
Anion gap: 12 (ref 5–15)
BUN: <5 mg/dL — ABNORMAL LOW (ref 6–20)
CO2: 28 mmol/L (ref 22–32)
Calcium: 8.9 mg/dL (ref 8.9–10.3)
Chloride: 99 mmol/L (ref 98–111)
Creatinine, Ser: 0.61 mg/dL (ref 0.61–1.24)
GFR calc Af Amer: >60 mL/min (ref >60)
GFR calc non Af Amer: >60 mL/min (ref >60)
Glucose, Bld: 98 mg/dL (ref 70–99)
Phosphorus: 4.1 mg/dL (ref 2.5–4.6)
Potassium: 3.3 mmol/L — ABNORMAL LOW (ref 3.5–5.1)
Sodium: 139 mmol/L (ref 135–145)

## 2020-06-12 LAB — CULTURE, BLOOD (ROUTINE X 2)
Culture: NO GROWTH
Culture: NO GROWTH
Special Requests: ADEQUATE
Special Requests: ADEQUATE

## 2020-06-12 LAB — MAGNESIUM: Magnesium: 1.7 mg/dL (ref 1.7–2.4)

## 2020-06-12 MED ORDER — POTASSIUM CHLORIDE 20 MEQ PO PACK
40.0000 meq | PACK | Freq: Once | ORAL | Status: AC
  Administered 2020-06-12: 40 meq via ORAL
  Filled 2020-06-12: qty 2

## 2020-06-12 MED ORDER — NOREPINEPHRINE 4 MG/250ML-% IV SOLN
2.0000 ug/min | INTRAVENOUS | Status: DC
  Administered 2020-06-12: 2 ug/min via INTRAVENOUS

## 2020-06-12 MED ORDER — NOREPINEPHRINE 4 MG/250ML-% IV SOLN
INTRAVENOUS | Status: AC
  Filled 2020-06-12: qty 250

## 2020-06-12 MED ORDER — POTASSIUM CHLORIDE 10 MEQ/100ML IV SOLN
10.0000 meq | INTRAVENOUS | Status: DC
  Administered 2020-06-12 (×2): 10 meq via INTRAVENOUS
  Filled 2020-06-12 (×6): qty 100

## 2020-06-12 MED ORDER — MAGNESIUM SULFATE 2 GM/50ML IV SOLN
2.0000 g | Freq: Once | INTRAVENOUS | Status: AC
  Administered 2020-06-12: 2 g via INTRAVENOUS
  Filled 2020-06-12: qty 50

## 2020-06-12 MED ORDER — SODIUM CHLORIDE 0.9 % IV SOLN: 250.0000 mL | INTRAVENOUS | Status: DC

## 2020-06-12 MED ORDER — LACTATED RINGERS IV BOLUS
1000.0000 mL | Freq: Once | INTRAVENOUS | Status: AC
  Administered 2020-06-12: 1000 mL via INTRAVENOUS

## 2020-06-12 MED ORDER — CLONIDINE HCL 0.1 MG PO TABS
0.1000 mg | ORAL_TABLET | Freq: Three times a day (TID) | ORAL | Status: DC
  Administered 2020-06-12 – 2020-06-22 (×25): 0.1 mg via ORAL
  Filled 2020-06-12 (×27): qty 1

## 2020-06-12 NOTE — Consult Note (Signed)
Client confused,unable to access psychiatrically at this time, will continue to monitor and assess when he is able to talk coherently.  Nanine Means, PMHNP

## 2020-06-12 NOTE — Progress Notes (Signed)
PT slept most of the night, speech and garbled, pt is able to follow simple command. Pt cont on precedex gtt at 0.8. VSS, Blood sugar stable. Good urine output, afebrile, one on one sitter at bedside. Pt has less secretions last night. Uneventful night with his behavior and impulsiveness.Marland Kitchen

## 2020-06-12 NOTE — Progress Notes (Signed)
Speech Language Pathology Treatment: Dysphagia  Patient Details Name: Jason Atkins MRN: [id number] DOB: 12/12/1970 Today's Date: 06/12/2020 Time: 1000-1100 SLP Time Calculation (min) (ACUTE ONLY): 60 min  Assessment / Plan / Recommendation Clinical Impression  Pt seen for ongoing assessment of swallowing. Pt was recommended to remain NPO post initial eval d/t lethargy. He has been on Precedex during most of his admit d/t being combative and agitated; orally intubated/self-extubated. His is a chronic ETOH abuser per chart notes. He appears much improved and alert today; verbally responsive and able to follow instructions w/ min cues. He was able to maintain eyes open and engagement w/ SLP as session continued. Pt is on RA; wbc wnl. Sitter present in room.  Pt explained general aspiration precautions and agreed verbally to the need for following them especially sitting upright for all oral intake. Pt assisted w/ positioning d/t weakness. Noted laryngeal-pharyngeal phlegm and instructed pt on coughing/expectorating = this resulted in chunks of mucous/phlegm being cleared; Sitter aware to inform NSG as needed. Pt was then given trials of thin and nectar liquids, ice chips, purees and soft solids. No immediate, overt clinical s/s of aspiration were noted w/ any consistency; respiratory status remained calm and unlabored, vocal quality clear b/t trials; no decline in O2 sats. However, a delayed cough was noted w/ trials of thin liquids. Pt helped to hold Cup when drinking following instructions for single, small sips slowly = support needed d/t UE weakness overall. NO straws were utiilized for better oral control. Pt exhibited adequate oral phase control of boluses w/ mastication, mashing/gumming of purees/softened solids; grossly WFL for bolus management and timely A-P transfer for swallowing; oral clearing achieved w/ all consistencies. Pt is missing some Dentition; poor condition. Due to pt's need for  verbal/tactile cues, declined mental status s/p Precedex, and min concern for aspiration w/ thin liquid consistency, recommend upgrade to Dysphagia level 3 diet (mech soft) w/ gravies added to moisten foods and Nectar liquids. Recommend general aspiration precautions; Pills Whole in Puree; tray setup and positioning assistance for meals. Supervision w/ oral intake at this time = Sitter present. ST services will continue to f/u w/ pt for toleration of diet, trials to upgrade safely to thin liquids, and education on aspiration precautions while admitted. NSG updated. Precautions posted at bedside.    HPI HPI: Pt is a 49yo male admitted 06/09/20 with alcohol withdrawal, homicidal ideation. PMH: HTN, CAD, CABG, alcohol and tobacco abuse, hepatic encephalopathy. ETOH abuse - +heavily drinking, approximately 25-30 beers per day. Pt intubated 7/21-23/21 - self extubated. Pt is stable on RA w/ increasing alertness/attention. Poor Dentition status.      SLP Plan  Continue with current plan of care       Recommendations  Diet recommendations: Dysphagia 3 (mechanical soft);Nectar-thick liquid Liquids provided via: Cup Medication Administration: Whole meds with puree (for safer swallowing) Supervision: Patient able to self feed;Staff to assist with self feeding;Intermittent supervision to cue for compensatory strategies Compensations: Minimize environmental distractions;Slow rate;Small sips/bites;Lingual sweep for clearance of pocketing;Follow solids with liquid Postural Changes and/or Swallow Maneuvers: Seated upright 90 degrees;Upright 30-60 min after meal                General recommendations:  (Dietician f/u) Oral Care Recommendations: Oral care BID;Oral care before and after PO;Staff/trained caregiver to provide oral care Follow up Recommendations: None SLP Visit Diagnosis: Dysphagia, oropharyngeal phase (R13.12) Plan: Continue with current plan of care       GO  Jerilynn Som, MS, CCC-SLP Vic Esco 06/12/2020, 1:43 PM

## 2020-06-12 NOTE — Progress Notes (Signed)
PHARMACY CONSULT NOTE - FOLLOW UP  Pharmacy Consult for Electrolyte Monitoring and Replacement   Recent Labs: Potassium (mmol/L)  Date Value  06/12/2020 3.3 (L)  01/08/2014 3.5   Magnesium (mg/dL)  Date Value  96/75/9163 1.7   Calcium (mg/dL)  Date Value  84/66/5993 8.9   Calcium, Total (mg/dL)  Date Value  57/11/7791 9.3   Albumin (g/dL)  Date Value  90/30/0923 3.3 (L)  01/08/2014 4.4   Phosphorus (mg/dL)  Date Value  30/05/6225 4.1   Sodium (mmol/L)  Date Value  06/12/2020 139  01/08/2014 139     Assessment: 49 year old male admitted post-fall 7/13. Per history, drinking 25-30 beers daily. Patient has been in the ICU on Precedex drip, Librium and CIWA protocol. Patient required intubation 7/21 for airway protection. Sedated with fentanyl and Precedex.  Patient self-extubated 7/23 morning.  Goal of Therapy:  Electrolytes WNL  Plan:  K 3.3  Mag 1.7  Phos 4.1 Scr 0.61 -Magnesium 2 g IV x 1 ordered by NP. Mag level only increased by 0.1 after receiving 2 gram Mag yesterday 7/23. Will add another 2 gm IV Magnesium sulfate x 1 this afternoon 7/24  -KCL 10 meq IV x 6  Ordered by NP.  Patient remains NPO unable to pass swallow eval-  tube feeding recommended.  Will follow up electrolytes with morning labs.  Angelique Blonder ,PharmD Clinical Pharmacist 06/12/2020 11:10 AM
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
## 2020-06-12 NOTE — Plan of Care (Signed)
PT slept most of the night, speech and garbled, pt is able to follow simple command. Pt cont on precedex gtt at 0.8. VSS, Blood sugar stable. Good urine output, afebrile, one on one sitter at bedside. Pt has less secretions last night. Uneventful night with his behavior and impulsiveness..  

## 2020-06-13 DIAGNOSIS — F332 Major depressive disorder, recurrent severe without psychotic features: Secondary | ICD-10-CM | POA: Diagnosis present

## 2020-06-13 DIAGNOSIS — F1020 Alcohol dependence, uncomplicated: Secondary | ICD-10-CM

## 2020-06-13 DIAGNOSIS — E43 Unspecified severe protein-calorie malnutrition: Secondary | ICD-10-CM

## 2020-06-13 DIAGNOSIS — W19XXXA Unspecified fall, initial encounter: Secondary | ICD-10-CM

## 2020-06-13 LAB — CBC WITH DIFFERENTIAL/PLATELET
Abs Immature Granulocytes: 0.07 10*3/uL (ref 0.00–0.07)
Basophils Absolute: 0.1 10*3/uL (ref 0.0–0.1)
Basophils Relative: 1 %
Eosinophils Absolute: 0.1 10*3/uL (ref 0.0–0.5)
Eosinophils Relative: 1 %
HCT: 31.4 % — ABNORMAL LOW (ref 39.0–52.0)
Hemoglobin: 11 g/dL — ABNORMAL LOW (ref 13.0–17.0)
Immature Granulocytes: 1 %
Lymphocytes Relative: 16 %
Lymphs Abs: 1.8 10*3/uL (ref 0.7–4.0)
MCH: 34.5 pg — ABNORMAL HIGH (ref 26.0–34.0)
MCHC: 35 g/dL (ref 30.0–36.0)
MCV: 98.4 fL (ref 80.0–100.0)
Monocytes Absolute: 1.6 10*3/uL — ABNORMAL HIGH (ref 0.1–1.0)
Monocytes Relative: 14 %
Neutro Abs: 7.9 10*3/uL — ABNORMAL HIGH (ref 1.7–7.7)
Neutrophils Relative %: 67 %
Platelets: 370 10*3/uL (ref 150–400)
RBC: 3.19 MIL/uL — ABNORMAL LOW (ref 4.22–5.81)
RDW: 12.3 % (ref 11.5–15.5)
WBC: 11.6 10*3/uL — ABNORMAL HIGH (ref 4.0–10.5)
nRBC: 0 % (ref 0.0–0.2)

## 2020-06-13 LAB — RENAL FUNCTION PANEL
Albumin: 3.2 g/dL — ABNORMAL LOW (ref 3.5–5.0)
Anion gap: 9 (ref 5–15)
BUN: <5 mg/dL — ABNORMAL LOW (ref 6–20)
CO2: 27 mmol/L (ref 22–32)
Calcium: 8.7 mg/dL — ABNORMAL LOW (ref 8.9–10.3)
Chloride: 104 mmol/L (ref 98–111)
Creatinine, Ser: 0.59 mg/dL — ABNORMAL LOW (ref 0.61–1.24)
GFR calc Af Amer: >60 mL/min (ref >60)
GFR calc non Af Amer: >60 mL/min (ref >60)
Glucose, Bld: 109 mg/dL — ABNORMAL HIGH (ref 70–99)
Phosphorus: 2.8 mg/dL (ref 2.5–4.6)
Potassium: 3.7 mmol/L (ref 3.5–5.1)
Sodium: 140 mmol/L (ref 135–145)

## 2020-06-13 LAB — GLUCOSE, CAPILLARY
Glucose-Capillary: 88 mg/dL (ref 70–99)
Glucose-Capillary: 110 mg/dL — ABNORMAL HIGH (ref 70–99)
Glucose-Capillary: 88 mg/dL (ref 70–99)

## 2020-06-13 LAB — MAGNESIUM: Magnesium: 2.1 mg/dL (ref 1.7–2.4)

## 2020-06-13 MED ORDER — ACETAMINOPHEN 325 MG PO TABS
650.0000 mg | ORAL_TABLET | Freq: Four times a day (QID) | ORAL | Status: DC | PRN
  Administered 2020-06-13: 17:00:00 650 mg via ORAL
  Filled 2020-06-13: qty 2

## 2020-06-13 MED ORDER — AMOXICILLIN-POT CLAVULANATE 875-125 MG PO TABS
1.0000 | ORAL_TABLET | Freq: Two times a day (BID) | ORAL | Status: AC
  Administered 2020-06-13 – 2020-06-16 (×6): 1 via ORAL
  Filled 2020-06-13 (×6): qty 1

## 2020-06-13 MED ORDER — CHLORPROMAZINE HCL 10 MG PO TABS
10.0000 mg | ORAL_TABLET | Freq: Three times a day (TID) | ORAL | Status: DC | PRN
  Administered 2020-06-13 – 2020-06-19 (×6): 10 mg via ORAL
  Filled 2020-06-13 (×7): qty 1

## 2020-06-13 MED ORDER — MENTHOL 3 MG MT LOZG
1.0000 | LOZENGE | OROMUCOSAL | Status: DC | PRN
  Filled 2020-06-13: qty 9

## 2020-06-13 NOTE — Progress Notes (Signed)
NP notified that patient has had hiccups since 2100. See new order.

## 2020-06-13 NOTE — Progress Notes (Signed)
PHARMACY CONSULT NOTE - FOLLOW UP  Pharmacy Consult for Electrolyte Monitoring and Replacement   Recent Labs: Potassium (mmol/L)  Date Value  06/13/2020 3.7  01/08/2014 3.5   Magnesium (mg/dL)  Date Value  70/62/3762 2.1   Calcium (mg/dL)  Date Value  83/15/1761 8.7 (L)   Calcium, Total (mg/dL)  Date Value  60/73/7106 9.3   Albumin (g/dL)  Date Value  26/94/8546 3.2 (L)  01/08/2014 4.4   Phosphorus (mg/dL)  Date Value  27/01/5008 2.8   Sodium (mmol/L)  Date Value  06/13/2020 140  01/08/2014 139     Assessment: 49 year old male admitted post-fall 7/13. Per history, drinking 25-30 beers daily. Patient has been in the ICU on Precedex drip, Librium and CIWA protocol. Patient required intubation 7/21 for airway protection. Sedated with fentanyl and Precedex.  Patient self-extubated 7/23 morning.  Goal of Therapy:  Electrolytes WNL  Plan:  K 3.7  Mag 2.1  Phos 2.8 Scr 0.59 No supplementation at this time. Watch Phos  able to eat on his own 7/24  Will follow up electrolytes with morning labs.  Angelique Blonder ,PharmD Clinical Pharmacist 06/13/2020 12:06 PM
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
## 2020-06-13 NOTE — Progress Notes (Signed)
PROGRESS NOTE    Jason Atkins  IDP:[id number] DOB: [birth year] DOA: 06/01/2020 PCP: [facility]    Brief Narrative:  Jason Atkins is a 49 y.o.malewith medical history significant ofhypertension, CAD, CABG, alcohol abuse, tobacco abuse, bipolar disorder, hepatic encephalopathy admitted with alcohol withdrawal and placed on IVC for homicidal ideations.      Assessment & Plan:   Principal Problem:   Alcohol withdrawal (HCC) Active Problems:   Alcohol abuse   Hypertension   Coronary artery disease   Homicidal ideation   Thrombocytopenia (HCC)   Tobacco abuse   Abnormal LFTs   Fall   Bipolar disorder (HCC)   Protein-calorie malnutrition, severe   Alcohol withdrawal syndrome Patient approximately drinks 25-30 beers daily.  He has been intermittently confused falling at home the day of hospital admission.  Started on Precedex infusion due to his severe agitation and withdrawal symptoms.  Despite Precedex and Ativan he continued with severe agitation and combativeness and was subsequently transferred to the intensive care unit on 7/21 and intubated and placed on sedation.  Since self extubated 7/23; and remained on Precedex that was weaned off. --Continue Librium 50 mg p.o. QID --Clonidine 0.1 mg p.o. 3 times daily --Valium 5 mg p.o. every 6 hours --Folic acid, multivitamin, thiamine  Bipolar disorder Homicidal ideation Patient had been reporting homicidal ideations at home.  History of bipolar disorder in the setting of severe continued alcohol use disorder.  Placed on IVC, psychiatry following. --Continue IVC, one-to-one sitter --Zyprexa 10 mg p.o. twice daily --Benztropine 0.5 mg p.o. twice daily --Pending inpatient psychiatric transfer  Aspiration pneumonia --De-escalate Unasyn 3 g every 6 hours to Augmentin 875-125 mg p.o. twice daily to complete 7-day course.  CAD/MI s/p CABG --Aspirin 81 mg p.o. daily  Essential hypertension --Clonidine 0.1 mg  p.o. TID  Tobacco use disorder --Nicotine patch  BPH: Continue tamsulosin 0.4 mg p.o. nightly  DVT prophylaxis: Lovenox Code Status: Full code Family Communication: None present at bedside  Disposition Plan:  Status is: Inpatient  Remains inpatient appropriate because:Altered mental status, Ongoing diagnostic testing needed not appropriate for outpatient work up and Unsafe d/c plan   Dispo: The patient is from: Home              Anticipated d/c is to: Inpatient psychiatry              Anticipated d/c date is: 2 days              Patient currently is not medically stable to d/c.   Consultants:   PCCM - signed off 06/12/2020  Psychiatry  Procedures:   Intubation 7/21  Self extubation 7/23  Antimicrobials:   Unasyn 7/21   Subjective: Patient seen and examined bedside, resting comfortably.  Eating breakfast.  Awaiting transfer to the floor.  Complaining of some sore throat.  Requesting throat lozenge.  No other complaints or concerns at this time.  Remains confused.  Denies headache, no chest pain, no palpitations, no shortness of breath, no abdominal pain.  No acute concerns overnight per nursing staff.  Objective: Vitals:   06/13/20 0700 06/13/20 0800 06/13/20 1100 06/13/20 1130  BP: (!) 120/94 (!) 125/86 110/77   Pulse: 102 92 95   Resp: (!) 36 22 23   Temp: 98.1 F (36.7 C)   98.2 F (36.8 C)  TempSrc: Oral   Oral  SpO2: 98% 97% 96%   Weight:      Height:        Intake/Output  Summary (Last 24 hours) at 06/13/2020 1444 Last data filed at 06/13/2020 1159 Gross per 24 hour  Intake 2469.86 ml  Output 2950 ml  Net -480.14 ml   Filed Weights   06/01/20 1512 06/03/20 2145 06/13/20 0600  Weight: 79.4 kg 66.9 kg 66.5 kg    Examination:  General exam: Appears calm and comfortable, confused Respiratory system: Clear to auscultation. Respiratory effort normal.  Oxygenating well on room air Cardiovascular system: S1 & S2 heard, RRR. No JVD, murmurs, rubs,  gallops or clicks. No pedal edema. Gastrointestinal system: Abdomen is nondistended, soft and nontender. No organomegaly or masses felt. Normal bowel sounds heard. Central nervous system: Alert, not oriented to person/place/time or situation. No focal neurological deficits. Extremities: Symmetric 5 x 5 power. Skin: No rashes, lesions or ulcers Psychiatry: Judgement and insight appear poor     Data Reviewed: I have personally reviewed following labs and imaging studies  CBC: Recent Labs  Lab 06/09/20 0450 06/10/20 0522 06/11/20 0400 06/12/20 0501 06/13/20 0558  WBC 7.5 6.7 9.1 10.2 11.6*  NEUTROABS 3.9 3.3 4.4 6.5 7.9*  HGB 11.6* 11.8* 12.5* 11.8* 11.0*  HCT 35.3* 33.4* 37.1* 33.4* 31.4*  MCV 102.9* 99.7 102.8* 97.7 98.4  PLT 173 215 272 331 370   Basic Metabolic Panel: Recent Labs  Lab 06/09/20 0450 06/10/20 0522 06/11/20 0400 06/12/20 0501 06/13/20 0558  NA 137 139 138 139 140  K 3.5 3.4* 3.6 3.3* 3.7  CL 104 104 103 99 104  CO2 27 26 28 28 27   GLUCOSE 119* 98 111* 98 109*  BUN 6 6 <5* <5* <5*  CREATININE 0.61 0.58* 0.59* 0.61 0.59*  CALCIUM 8.7* 8.5* 8.6* 8.9 8.7*  MG 1.7 1.7 1.6* 1.7 2.1  PHOS 2.8 2.9 2.2* 4.1 2.8   GFR: Estimated Creatinine Clearance: 106.2 mL/min (A) (by C-G formula based on SCr of 0.59 mg/dL (L)). Liver Function Tests: Recent Labs  Lab 06/09/20 0450 06/10/20 0522 06/11/20 0400 06/12/20 0501 06/13/20 0558  ALBUMIN 3.3* 3.2* 3.2* 3.3* 3.2*   No results for input(s): LIPASE, AMYLASE in the last 168 hours. No results for input(s): AMMONIA in the last 168 hours. Coagulation Profile: No results for input(s): INR, PROTIME in the last 168 hours. Cardiac Enzymes: No results for input(s): CKTOTAL, CKMB, CKMBINDEX, TROPONINI in the last 168 hours. BNP (last 3 results) No results for input(s): PROBNP in the last 8760 hours. HbA1C: No results for input(s): HGBA1C in the last 72 hours. CBG: Recent Labs  Lab 06/12/20 1129 06/12/20 1648  06/12/20 2139 06/13/20 0740 06/13/20 1110  GLUCAP 102* 86 113* 88 110*   Lipid Profile: No results for input(s): CHOL, HDL, LDLCALC, TRIG, CHOLHDL, LDLDIRECT in the last 72 hours. Thyroid Function Tests: No results for input(s): TSH, T4TOTAL, FREET4, T3FREE, THYROIDAB in the last 72 hours. Anemia Panel: No results for input(s): VITAMINB12, FOLATE, FERRITIN, TIBC, IRON, RETICCTPCT in the last 72 hours. Sepsis Labs: Recent Labs  Lab 06/07/20 1548  PROCALCITON 0.17    Recent Results (from the past 240 hour(s))  MRSA PCR Screening     Status: None   Collection Time: 06/03/20  9:45 PM   Specimen: Nasal Mucosa; Nasopharyngeal  Result Value Ref Range Status   MRSA by PCR NEGATIVE NEGATIVE Final    Comment:        The GeneXpert MRSA Assay (FDA approved for NASAL specimens only), is one component of a comprehensive MRSA colonization surveillance program. It is not intended to diagnose MRSA infection nor to guide  or monitor treatment for MRSA infections. Performed at [facility], [address] [place], [place] [address]   CULTURE, BLOOD (ROUTINE X 2) w Reflex to ID Panel     Status: None   Collection Time: 06/07/20  3:48 PM   Specimen: BLOOD  Result Value Ref Range Status   Specimen Description BLOOD BLOOD RIGHT HAND  Final   Special Requests   Final    BOTTLES DRAWN AEROBIC AND ANAEROBIC Blood Culture adequate volume   Culture   Final    NO GROWTH 5 DAYS Performed at [facility], [address] [place], [place] [address]    Report Status 06/12/2020 FINAL  Final  CULTURE, BLOOD (ROUTINE X 2) w Reflex to ID Panel     Status: None   Collection Time: 06/07/20  3:55 PM   Specimen: BLOOD  Result Value Ref Range Status   Specimen Description BLOOD BLOOD LEFT HAND  Final   Special Requests   Final    BOTTLES DRAWN AEROBIC AND ANAEROBIC Blood Culture adequate volume   Culture   Final    NO GROWTH 5 DAYS Performed at [facility], [address], [place], [place] [address]    Report Status 06/12/2020 FINAL  Final         Radiology Studies: No results found.      Scheduled Meds: . aspirin EC  81 mg Oral Daily  . benztropine  0.5 mg Oral BID  . chlordiazePOXIDE  50 mg Oral QID  . Chlorhexidine Gluconate Cloth  6 each Topical Q0600  . cloNIDine  0.1 mg Oral TID  . diazepam  5 mg Oral Q6H  . enoxaparin (LOVENOX) injection  40 mg Subcutaneous Q24H  . folic acid  1 mg Oral Daily  . multivitamin with minerals  1 tablet Oral Daily  . nicotine  21 mg Transdermal Daily  . OLANZapine zydis  10 mg Oral BID  . pantoprazole (PROTONIX) IV  40 mg Intravenous q1800  . pneumococcal 23 valent vaccine  0.5 mL Intramuscular Tomorrow-1000  . tamsulosin  0.4 mg Oral QPC supper  . thiamine  100 mg Oral Daily   Or  . thiamine  100 mg Intravenous Daily   Continuous Infusions: . sodium chloride    . ampicillin-sulbactam (UNASYN) IV 3 g (06/13/20 1023)  . norepinephrine (LEVOPHED) Adult infusion Stopped (06/12/20 1439)     LOS: 9 days    Time spent: 36 minutes spent on chart review, discussion with nursing staff, consultants, updating family and interview/physical exam; more than 50% of that time was spent in counseling and/or coordination of care.    Alvira Philips [place], DO [facility] Available via Epic secure chat 7am-7pm After these hours, please refer to coverage provider listed on [email or web address] 06/13/2020, 2:44 PM

## 2020-06-14 DIAGNOSIS — F3163 Bipolar disorder, current episode mixed, severe, without psychotic features: Secondary | ICD-10-CM

## 2020-06-14 LAB — CBC WITH DIFFERENTIAL/PLATELET
Abs Immature Granulocytes: 0.06 10*3/uL (ref 0.00–0.07)
Basophils Absolute: 0.2 10*3/uL — ABNORMAL HIGH (ref 0.0–0.1)
Basophils Relative: 1 %
Eosinophils Absolute: 0.3 10*3/uL (ref 0.0–0.5)
Eosinophils Relative: 2 %
HCT: 30.6 % — ABNORMAL LOW (ref 39.0–52.0)
Hemoglobin: 10.6 g/dL — ABNORMAL LOW (ref 13.0–17.0)
Immature Granulocytes: 1 %
Lymphocytes Relative: 17 %
Lymphs Abs: 1.9 10*3/uL (ref 0.7–4.0)
MCH: 34.6 pg — ABNORMAL HIGH (ref 26.0–34.0)
MCHC: 34.6 g/dL (ref 30.0–36.0)
MCV: 100 fL (ref 80.0–100.0)
Monocytes Absolute: 1.7 10*3/uL — ABNORMAL HIGH (ref 0.1–1.0)
Monocytes Relative: 15 %
Neutro Abs: 7.2 10*3/uL (ref 1.7–7.7)
Neutrophils Relative %: 64 %
Platelets: 402 10*3/uL — ABNORMAL HIGH (ref 150–400)
RBC: 3.06 MIL/uL — ABNORMAL LOW (ref 4.22–5.81)
RDW: 12.7 % (ref 11.5–15.5)
WBC: 11.3 10*3/uL — ABNORMAL HIGH (ref 4.0–10.5)
nRBC: 0 % (ref 0.0–0.2)

## 2020-06-14 LAB — MAGNESIUM: Magnesium: 1.8 mg/dL (ref 1.7–2.4)

## 2020-06-14 LAB — RENAL FUNCTION PANEL
Albumin: 3.4 g/dL — ABNORMAL LOW (ref 3.5–5.0)
Anion gap: 4 — ABNORMAL LOW (ref 5–15)
BUN: 6 mg/dL (ref 6–20)
CO2: 30 mmol/L (ref 22–32)
Calcium: 8.6 mg/dL — ABNORMAL LOW (ref 8.9–10.3)
Chloride: 107 mmol/L (ref 98–111)
Creatinine, Ser: 0.68 mg/dL (ref 0.61–1.24)
GFR calc Af Amer: >60 mL/min (ref >60)
GFR calc non Af Amer: >60 mL/min (ref >60)
Glucose, Bld: 94 mg/dL (ref 70–99)
Phosphorus: 3.4 mg/dL (ref 2.5–4.6)
Potassium: 3.6 mmol/L (ref 3.5–5.1)
Sodium: 141 mmol/L (ref 135–145)

## 2020-06-14 LAB — GLUCOSE, CAPILLARY
Glucose-Capillary: 86 mg/dL (ref 70–99)
Glucose-Capillary: 91 mg/dL (ref 70–99)
Glucose-Capillary: 94 mg/dL (ref 70–99)

## 2020-06-14 MED ORDER — HALOPERIDOL LACTATE 5 MG/ML IJ SOLN
INTRAMUSCULAR | Status: AC
  Administered 2020-06-14: 5 mg
  Filled 2020-06-14: qty 1

## 2020-06-14 MED ORDER — CHLORDIAZEPOXIDE HCL 10 MG PO CAPS
50.0000 mg | ORAL_CAPSULE | Freq: Three times a day (TID) | ORAL | Status: DC
  Administered 2020-06-14 – 2020-06-15 (×3): 50 mg via ORAL
  Filled 2020-06-14: qty 5
  Filled 2020-06-14: qty 10
  Filled 2020-06-14: qty 5
  Filled 2020-06-14: qty 10

## 2020-06-14 MED ORDER — HALOPERIDOL LACTATE 5 MG/ML IJ SOLN
5.0000 mg | Freq: Four times a day (QID) | INTRAMUSCULAR | Status: DC | PRN
  Administered 2020-06-14 – 2020-06-17 (×5): 5 mg via INTRAVENOUS
  Filled 2020-06-14 (×5): qty 1

## 2020-06-14 MED ORDER — DIAZEPAM 5 MG PO TABS
5.0000 mg | ORAL_TABLET | Freq: Three times a day (TID) | ORAL | Status: DC
  Administered 2020-06-14 – 2020-06-15 (×2): 5 mg via ORAL
  Filled 2020-06-14 (×3): qty 1

## 2020-06-14 NOTE — Progress Notes (Signed)
PHARMACY CONSULT NOTE - FOLLOW UP  Pharmacy Consult for Electrolyte Monitoring and Replacement   Recent Labs: Potassium (mmol/L)  Date Value  06/14/2020 3.6  01/08/2014 3.5   Magnesium (mg/dL)  Date Value  32/10/2481 1.8   Calcium (mg/dL)  Date Value  50/01/7047 8.6 (L)   Calcium, Total (mg/dL)  Date Value  88/91/6945 9.3   Albumin (g/dL)  Date Value  03/88/8280 3.4 (L)  01/08/2014 4.4   Phosphorus (mg/dL)  Date Value  03/49/1791 3.4   Sodium (mmol/L)  Date Value  06/14/2020 141  01/08/2014 139     Assessment: 49 year old male admitted post-fall 7/13. Per history, drinking 25-30 beers daily. Patient has been in the ICU on Precedex drip, Librium and CIWA protocol. Patient required intubation 7/21 for airway protection. Sedated with fentanyl and Precedex.  Patient self-extubated 7/23 morning. Transferred out of ICU to 1C on 7/25  Goal of Therapy:  Electrolytes WNL  Plan:  K 3.6  Mag 1.8  Phos 3.4 Scr 0.68 No supplementation needed at this time.  Labs WNL x2 days and pt transferred out of ICU, will sign off at this time.   Crist Fat, PharmD, BCPS Clinical Pharmacist 06/14/2020 7:55 AM

## 2020-06-14 NOTE — Progress Notes (Signed)
PROGRESS NOTE    Jason Atkins  VVO:[id number] DOB: 09-18-1971 DOA: 06/01/2020 PCP: [facility]    Brief Narrative:  Jason Atkins is a 49 y.o.malewith medical history significant ofhypertension, CAD, CABG, alcohol abuse, tobacco abuse, bipolar disorder, hepatic encephalopathy admitted with alcohol withdrawal and placed on IVC for homicidal ideations.   Assessment & Plan:   Principal Problem:   Alcohol withdrawal (HCC) Active Problems:   Alcohol abuse   Hypertension   Coronary artery disease   Thrombocytopenia (HCC)   Tobacco abuse   Abnormal LFTs   Protein-calorie malnutrition, severe   Alcohol use disorder, severe, dependence (HCC)   Major depressive disorder, recurrent severe without psychotic features (HCC)   Alcohol withdrawal syndrome Patient approximately drinks 25-30 beers daily.  He has been intermittently confused falling at home the day of hospital admission.  Started on Precedex infusion due to his severe agitation and withdrawal symptoms.  Despite Precedex and Ativan he continued with severe agitation and combativeness and was subsequently transferred to the intensive care unit on 7/21 and intubated and placed on sedation.  Since self extubated 7/23; and remained on Precedex that was weaned off. --Continue Librium, will decrease dose to 50 mg p.o. 3 times daily today --Clonidine 0.1 mg p.o. 3 times daily --Valium 5 mg p.o. every 8 hours --Folic acid, multivitamin, thiamine  Bipolar disorder Homicidal ideation Patient had been reporting homicidal ideations at home.  History of bipolar disorder in the setting of severe continued alcohol use disorder.  Placed on IVC, psychiatry following. --Continue IVC, one-to-one sitter --Zyprexa 10 mg p.o. twice daily --Benztropine 0.5 mg p.o. twice daily --Pending inpatient psychiatric transfer  Aspiration pneumonia --De-escalate Unasyn 3 g every 6 hours to Augmentin 875-125 mg p.o. twice daily to complete  7-day course.  CAD/MI s/p CABG --Aspirin 81 mg p.o. daily  Essential hypertension --Clonidine 0.1 mg p.o. TID  Tobacco use disorder --Nicotine patch  BPH: Continue tamsulosin 0.4 mg p.o. nightly  DVT prophylaxis: Lovenox Code Status: Full code Family Communication: None present at bedside  Disposition Plan:  Status is: Inpatient  Remains inpatient appropriate because:Altered mental status, Ongoing diagnostic testing needed not appropriate for outpatient work up and Unsafe d/c plan   Dispo: The patient is from: Home              Anticipated d/c is to: Inpatient psychiatry              Anticipated d/c date is: 1 day              Patient currently is medically stable to d/c.  To inpatient psychiatry   Consultants:   PCCM - signed off 06/12/2020  Psychiatry  Procedures:   Intubation 7/21  Self extubation 7/23  Antimicrobials:   Unasyn 7/21   Subjective: Patient seen and examined bedside, resting comfortably.  Agitated this morning, now calm and eating breakfast. No other complaints or concerns at this time.  Sitter present.  Remains confused.  Stable for transfer to inpatient psychiatry.  Denies headache, no chest pain, no palpitations, no shortness of breath, no abdominal pain.  No other acute concerns overnight per nursing staff.  Objective: Vitals:   06/13/20 2034 06/14/20 0501 06/14/20 0742 06/14/20 1200  BP: 114/72 111/75 (!) 140/130 (!) 132/82  Pulse: 93 89 84 84  Resp:   16 20  Temp: 99.7 F (37.6 C) 98.8 F (37.1 C) 98.2 F (36.8 C) 98.9 F (37.2 C)  TempSrc: Oral Oral Oral Oral  SpO2: 100%  98% 99% 95%  Weight:      Height:        Intake/Output Summary (Last 24 hours) at 06/14/2020 1337 Last data filed at 06/14/2020 1315 Gross per 24 hour  Intake 840 ml  Output --  Net 840 ml   Filed Weights   06/01/20 1512 06/03/20 2145 06/13/20 0600  Weight: 79.4 kg 66.9 kg 66.5 kg    Examination:  General exam: Appears calm and comfortable,  confused Respiratory system: Clear to auscultation. Respiratory effort normal.  Oxygenating well on room air Cardiovascular system: S1 & S2 heard, RRR. No JVD, murmurs, rubs, gallops or clicks. No pedal edema. Gastrointestinal system: Abdomen is nondistended, soft and nontender. No organomegaly or masses felt. Normal bowel sounds heard. Central nervous system: Alert, not oriented to person/place/time or situation. No focal neurological deficits. Extremities: Symmetric 5 x 5 power. Skin: No rashes, lesions or ulcers Psychiatry: Judgement and insight appear poor     Data Reviewed: I have personally reviewed following labs and imaging studies  CBC: Recent Labs  Lab 06/10/20 0522 06/11/20 0400 06/12/20 0501 06/13/20 0558 06/14/20 0558  WBC 6.7 9.1 10.2 11.6* 11.3*  NEUTROABS 3.3 4.4 6.5 7.9* 7.2  HGB 11.8* 12.5* 11.8* 11.0* 10.6*  HCT 33.4* 37.1* 33.4* 31.4* 30.6*  MCV 99.7 102.8* 97.7 98.4 100.0  PLT 215 272 331 370 402*   Basic Metabolic Panel: Recent Labs  Lab 06/10/20 0522 06/11/20 0400 06/12/20 0501 06/13/20 0558 06/14/20 0558  NA 139 138 139 140 141  K 3.4* 3.6 3.3* 3.7 3.6  CL 104 103 99 104 107  CO2 26 28 28 27 30   GLUCOSE 98 111* 98 109* 94  BUN 6 <5* <5* <5* 6  CREATININE 0.58* 0.59* 0.61 0.59* 0.68  CALCIUM 8.5* 8.6* 8.9 8.7* 8.6*  MG 1.7 1.6* 1.7 2.1 1.8  PHOS 2.9 2.2* 4.1 2.8 3.4   GFR: Estimated Creatinine Clearance: 106.2 mL/min (by C-G formula based on SCr of 0.68 mg/dL). Liver Function Tests: Recent Labs  Lab 06/10/20 0522 06/11/20 0400 06/12/20 0501 06/13/20 0558 06/14/20 0558  ALBUMIN 3.2* 3.2* 3.3* 3.2* 3.4*   No results for input(s): LIPASE, AMYLASE in the last 168 hours. No results for input(s): AMMONIA in the last 168 hours. Coagulation Profile: No results for input(s): INR, PROTIME in the last 168 hours. Cardiac Enzymes: No results for input(s): CKTOTAL, CKMB, CKMBINDEX, TROPONINI in the last 168 hours. BNP (last 3 results) No  results for input(s): PROBNP in the last 8760 hours. HbA1C: No results for input(s): HGBA1C in the last 72 hours. CBG: Recent Labs  Lab 06/13/20 0740 06/13/20 1110 06/13/20 2104 06/14/20 0742 06/14/20 1158  GLUCAP 88 110* 88 86 91   Lipid Profile: No results for input(s): CHOL, HDL, LDLCALC, TRIG, CHOLHDL, LDLDIRECT in the last 72 hours. Thyroid Function Tests: No results for input(s): TSH, T4TOTAL, FREET4, T3FREE, THYROIDAB in the last 72 hours. Anemia Panel: No results for input(s): VITAMINB12, FOLATE, FERRITIN, TIBC, IRON, RETICCTPCT in the last 72 hours. Sepsis Labs: Recent Labs  Lab 06/07/20 1548  PROCALCITON 0.17    Recent Results (from the past 240 hour(s))  CULTURE, BLOOD (ROUTINE X 2) w Reflex to ID Panel     Status: None   Collection Time: 06/07/20  3:48 PM   Specimen: BLOOD  Result Value Ref Range Status   Specimen Description BLOOD BLOOD RIGHT HAND  Final   Special Requests   Final    BOTTLES DRAWN AEROBIC AND ANAEROBIC Blood Culture adequate volume  Culture   Final    NO GROWTH 5 DAYS Performed at [facility], [address] [place], [place] [address]    Report Status 06/12/2020 FINAL  Final  CULTURE, BLOOD (ROUTINE X 2) w Reflex to ID Panel     Status: None   Collection Time: 06/07/20  3:55 PM   Specimen: BLOOD  Result Value Ref Range Status   Specimen Description BLOOD BLOOD LEFT HAND  Final   Special Requests   Final    BOTTLES DRAWN AEROBIC AND ANAEROBIC Blood Culture adequate volume   Culture   Final    NO GROWTH 5 DAYS Performed at [facility], [address], [place], [place] [address]    Report Status 06/12/2020 FINAL  Final         Radiology Studies: No results found.      Scheduled Meds: . amoxicillin-clavulanate  1 tablet Oral Q12H  . aspirin EC  81 mg Oral Daily  . benztropine  0.5 mg Oral BID  . chlordiazePOXIDE  50 mg Oral TID  . Chlorhexidine Gluconate Cloth  6 each Topical Q0600  . cloNIDine   0.1 mg Oral TID  . diazepam  5 mg Oral Q8H  . enoxaparin (LOVENOX) injection  40 mg Subcutaneous Q24H  . folic acid  1 mg Oral Daily  . multivitamin with minerals  1 tablet Oral Daily  . nicotine  21 mg Transdermal Daily  . OLANZapine zydis  10 mg Oral BID  . pneumococcal 23 valent vaccine  0.5 mL Intramuscular Tomorrow-1000  . tamsulosin  0.4 mg Oral QPC supper  . thiamine  100 mg Oral Daily   Or  . thiamine  100 mg Intravenous Daily   Continuous Infusions: . sodium chloride       LOS: 10 days    Time spent: 36 minutes spent on chart review, discussion with nursing staff, consultants, updating family and interview/physical exam; more than 50% of that time was spent in counseling and/or coordination of care.    Alvira Philips [place], DO [facility] Available via Epic secure chat 7am-7pm After these hours, please refer to coverage provider listed on [email or web address] 06/14/2020, 1:37 PM
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
## 2020-06-14 NOTE — Progress Notes (Signed)
Speech Language Pathology Treatment: Dysphagia  Patient Details Name: Jason Atkins MRN: [id number] DOB: 1971/05/06 Today's Date: 06/14/2020 Time: 7062-3762 SLP Time Calculation (min) (ACUTE ONLY): 45 min  Assessment / Plan / Recommendation Clinical Impression  Pt seen for ongoing assessment of swallowing. Pt continues to present w/ declined mental/Cognitive status and requires Min-Mod cues for follow through w/ tasks at times. Pt is a chronic ETOH abuser per chart notes. He appears much more alert today; verbally responsive and able to follow instructions w/ less verbal/tactile cues. Pt is on RA; wbc wnl. Sitter present in room. During tasks, pt exhibited decreased awareness during self-feeding but problem-solved by using his fingers when unable to scoop the food - OT f/u would be beneficial w/ this task.  Pt explained general aspiration precautions and agreed verbally to the need for following them especially sitting upright for all oral intake. Pt assisted w/ positioning d/t weakness. He could not recall general aspiration precautions independently. Noted same min. laryngeal-pharyngeal phlegm and instructed pt on coughing/expectorating as needed. Pt fed self trials of Nectar liquids, purees and soft solids. No immediate, overt clinical s/s of aspiration were noted w/ any consistency; respiratory status remained calm and unlabored, vocal quality clear b/t trials. A delayed cough was noted x2 b/t trials of food/Nectar liquids -- suspect could be related to own saliva building up w/ min lengthier oral phase w/ meats. This cough did not increase w/ po intake. Pt helped to feed self and hold Cup = support needed d/t UE weakness overall, poor hand control w/ utensil. NO straws were utiilized for better oral control. Pt exhibited adequate oral phase control of boluses w/ mastication, mashing/gumming of purees/softened solids; grossly WFL for bolus management and A-P transfer for swallowing given min time; oral  clearing achieved w/ all consistencies esp. when alternating foods/liquids. Pt is missing some Dentition; poor condition. Due to pt's need for verbal/tactile cues, declined mental/Cognitive status' s/p illness, and concern for aspiration w/ thin liquid consistency, recommend remaining on a Dysphagia level 3 diet (mech soft) w/ gravies added to moisten foods and Nectar liquids via Cup. Recommend general aspiration precautions; Pills Whole in Puree; tray setup and positioning assistance for meals. Support at meals/oral intake at this time = Sitter present. ST services will continue to f/u w/ pt for toleration of diet, trials to upgrade safely to thin liquids, and education on aspiration precautions while admitted. NSG updated. Precautions posted at bedside.     HPI HPI: Pt is a 49yo male admitted 06/09/20 with alcohol withdrawal, homicidal ideation. PMH: HTN, CAD, CABG, alcohol and tobacco abuse, hepatic encephalopathy. ETOH abuse - +heavily drinking, approximately 25-30 beers per day. Pt intubated 7/21-23/21 - self extubated. Pt is stable on RA w/ increasing alertness/attention. Poor Dentition status.      SLP Plan  Continue with current plan of care       Recommendations  Diet recommendations: Dysphagia 3 (mechanical soft);Nectar-thick liquid Liquids provided via: Cup Medication Administration: Whole meds with puree (as able vs Crushed as needed for swallowing) Supervision: Patient able to self feed;Staff to assist with self feeding;Intermittent supervision to cue for compensatory strategies Compensations: Minimize environmental distractions;Slow rate;Small sips/bites;Lingual sweep for clearance of pocketing;Follow solids with liquid;Multiple dry swallows after each bite/sip (clear own saliva) Postural Changes and/or Swallow Maneuvers: Seated upright 90 degrees;Upright 30-60 min after meal                General recommendations:  (Dietician f/u) Oral Care Recommendations: Oral care BID;Oral  care before  and after PO;Staff/trained caregiver to provide oral care Follow up Recommendations: None SLP Visit Diagnosis: Dysphagia, oropharyngeal phase (R13.12) Plan: Continue with current plan of care       GO                 Jerilynn Som, MS, CCC-SLP Jason Atkins 06/14/2020, 1:15 PM

## 2020-06-15 LAB — CBC WITH DIFFERENTIAL/PLATELET
Abs Immature Granulocytes: 0.07 10*3/uL (ref 0.00–0.07)
Basophils Absolute: 0.2 10*3/uL — ABNORMAL HIGH (ref 0.0–0.1)
Basophils Relative: 1 %
Eosinophils Absolute: 0.4 10*3/uL (ref 0.0–0.5)
Eosinophils Relative: 3 %
HCT: 32.6 % — ABNORMAL LOW (ref 39.0–52.0)
Hemoglobin: 11 g/dL — ABNORMAL LOW (ref 13.0–17.0)
Immature Granulocytes: 1 %
Lymphocytes Relative: 15 %
Lymphs Abs: 1.8 10*3/uL (ref 0.7–4.0)
MCH: 34.3 pg — ABNORMAL HIGH (ref 26.0–34.0)
MCHC: 33.7 g/dL (ref 30.0–36.0)
MCV: 101.6 fL — ABNORMAL HIGH (ref 80.0–100.0)
Monocytes Absolute: 1.7 10*3/uL — ABNORMAL HIGH (ref 0.1–1.0)
Monocytes Relative: 14 %
Neutro Abs: 7.5 10*3/uL (ref 1.7–7.7)
Neutrophils Relative %: 66 %
Platelets: 436 10*3/uL — ABNORMAL HIGH (ref 150–400)
RBC: 3.21 MIL/uL — ABNORMAL LOW (ref 4.22–5.81)
RDW: 12.4 % (ref 11.5–15.5)
WBC: 11.6 10*3/uL — ABNORMAL HIGH (ref 4.0–10.5)
nRBC: 0 % (ref 0.0–0.2)

## 2020-06-15 LAB — RENAL FUNCTION PANEL
Albumin: 3.4 g/dL — ABNORMAL LOW (ref 3.5–5.0)
Anion gap: 8 (ref 5–15)
BUN: 8 mg/dL (ref 6–20)
CO2: 25 mmol/L (ref 22–32)
Calcium: 9.1 mg/dL (ref 8.9–10.3)
Chloride: 106 mmol/L (ref 98–111)
Creatinine, Ser: 0.54 mg/dL — ABNORMAL LOW (ref 0.61–1.24)
GFR calc Af Amer: >60 mL/min (ref >60)
GFR calc non Af Amer: >60 mL/min (ref >60)
Glucose, Bld: 96 mg/dL (ref 70–99)
Phosphorus: 4.3 mg/dL (ref 2.5–4.6)
Potassium: 3.7 mmol/L (ref 3.5–5.1)
Sodium: 139 mmol/L (ref 135–145)

## 2020-06-15 LAB — GLUCOSE, CAPILLARY
Glucose-Capillary: 78 mg/dL (ref 70–99)
Glucose-Capillary: 125 mg/dL — ABNORMAL HIGH (ref 70–99)
Glucose-Capillary: 92 mg/dL (ref 70–99)
Glucose-Capillary: 111 mg/dL — ABNORMAL HIGH (ref 70–99)

## 2020-06-15 LAB — MAGNESIUM: Magnesium: 1.9 mg/dL (ref 1.7–2.4)

## 2020-06-15 MED ORDER — DIAZEPAM 5 MG PO TABS
5.0000 mg | ORAL_TABLET | Freq: Two times a day (BID) | ORAL | Status: DC
  Administered 2020-06-15 – 2020-06-16 (×2): 5 mg via ORAL
  Filled 2020-06-15 (×2): qty 1

## 2020-06-15 MED ORDER — CHLORDIAZEPOXIDE HCL 25 MG PO CAPS
50.0000 mg | ORAL_CAPSULE | Freq: Two times a day (BID) | ORAL | Status: DC
  Administered 2020-06-15 – 2020-06-22 (×14): 50 mg via ORAL
  Filled 2020-06-15: qty 10
  Filled 2020-06-15 (×5): qty 2
  Filled 2020-06-15: qty 5
  Filled 2020-06-15 (×8): qty 2

## 2020-06-15 NOTE — Progress Notes (Signed)
Nutrition Follow-up  DOCUMENTATION CODES:   Severe malnutrition in context of social or environmental circumstances  INTERVENTION:  Provide Hormel Shake po TID with meals, each supplement provides 520 kcal and 22 grams of protein.  NUTRITION DIAGNOSIS:   Severe Malnutrition related to social / environmental circumstances (EtOH abuse, inadequate oral intake) as evidenced by moderate fat depletion, severe muscle depletion, percent weight loss.  Ongoing.  GOAL:   Patient will meet greater than or equal to 90% of their needs  Progressing.  MONITOR:   Vent status, Labs, Weight trends, TF tolerance, I & O's  REASON FOR ASSESSMENT:   Ventilator, Consult Enteral/tube feeding initiation and management  ASSESSMENT:   49 year old male with PMHx of CAD s/p CABG, HTN, EtOH abuse, bipolar disorder admitted with severe alcohol withdrawal with homicidal ideations and required intubation on 7/21.   -Diet was advanced to dysphagia 3 with nectar-thick liquids on 7/24.  Met with patient at bedside but he was sleeping soundly. Safety sitter reports patient got tired after working with SLP and has been sleeping since. According to chart patient ate 100% of his breakfast yesterday and 85% of his lunch yesterday. No meal documentation recorded for dinner last night or breakfast this morning and sitter reports a different sitter was with patient at breakfast. RD will order nectar-thick ONS to come on trays.  Medications reviewed and include: Augmentin, folic acid 1 mg daily, MVI daily, nicotine patch, thiamine 100 mg daily.  Labs reviewed: CBG 78-125, Creatinine 0.54.  Diet Order:   Diet Order            DIET DYS 3 Room service appropriate? Yes with Assist; Fluid consistency: Nectar Thick  Diet effective now                EDUCATION NEEDS:   No education needs have been identified at this time  Skin:  Skin Assessment: Reviewed RN Assessment  Last BM:  06/12/2020  Height:   Ht  Readings from Last 1 Encounters:  06/09/20 _0  (1.803 m)   Weight:   Wt Readings from Last 1 Encounters:  06/13/20 66.5 kg   Ideal Body Weight:  78.2 kg  BMI:  Body mass index is 20.45 kg/m.  Estimated Nutritional Needs:   Kcal:  2000-2200  Protein:  100-115 grams  Fluid:  2-2.3 L/day  Jacklynn Barnacle, MS, RD, LDN Pager number available on Amion

## 2020-06-15 NOTE — Progress Notes (Addendum)
PROGRESS NOTE    Jason Atkins  GXQ:[id number] DOB: January 29, 1971 DOA: 06/01/2020 PCP: [facility]    Brief Narrative:  Jason Atkins is a 49 y.o.malewith medical history significant ofhypertension, CAD, CABG, alcohol abuse, tobacco abuse, bipolar disorder, hepatic encephalopathy admitted with alcohol withdrawal and placed on IVC for homicidal ideations.   Assessment & Plan:   Principal Problem:   Alcohol withdrawal (HCC) Active Problems:   Alcohol abuse   Hypertension   Coronary artery disease   Thrombocytopenia (HCC)   Tobacco abuse   Abnormal LFTs   Protein-calorie malnutrition, severe   Alcohol use disorder, severe, dependence (HCC)   Major depressive disorder, recurrent severe without psychotic features (HCC)   Alcohol withdrawal syndrome Patient approximately drinks 25-30 beers daily.  He has been intermittently confused falling at home the day of hospital admission.  Started on Precedex infusion due to his severe agitation and withdrawal symptoms.  Despite Precedex and Ativan he continued with severe agitation and combativeness and was subsequently transferred to the intensive care unit on 7/21 and intubated and placed on sedation.  Since self extubated 7/23; and remained on Precedex that was weaned off. --Continue Librium, will decrease dose to 50 mg p.o. BID today --Clonidine 0.1 mg p.o. 3 times daily --Valium 5 mg p.o. q12h --Folic acid, multivitamin, thiamine  Bipolar disorder Homicidal ideation Patient had been reporting homicidal ideations at home.  History of bipolar disorder in the setting of severe continued alcohol use disorder.  Placed on IVC, psychiatry following. --Continue IVC, one-to-one sitter --Zyprexa 10 mg p.o. twice daily --Benztropine 0.5 mg p.o. twice daily --Pending inpatient psychiatric transfer; medically stable for transfer  Aspiration pneumonia --De-escalate Unasyn 3 g every 6 hours to Augmentin 875-125 mg p.o. twice daily  to complete 7-day course.  CAD/MI s/p CABG --Aspirin 81 mg p.o. daily  Essential hypertension --Clonidine 0.1 mg p.o. TID  Tobacco use disorder --Nicotine patch  BPH: Continue tamsulosin 0.4 mg p.o. nightly  DVT prophylaxis: Lovenox Code Status: Full code Family Communication: None present at bedside, received message from nursing to call spouse, Archie Patten for update.  Attempted to call and went straight to voicemail.  Disposition Plan:  Status is: Inpatient  Remains inpatient appropriate because:Altered mental status, Ongoing diagnostic testing needed not appropriate for outpatient work up and Unsafe d/c plan   Dispo: The patient is from: Home              Anticipated d/c is to: Inpatient psychiatry              Anticipated d/c date is: 1 day              Patient currently is medically stable to d/c.  To inpatient psychiatry   Consultants:   PCCM - signed off 06/12/2020  Psychiatry  Procedures:   Intubation 7/21  Self extubation 7/23  Antimicrobials:   Unasyn 7/21 - 7/25  Augmentin 7/25>>   Subjective: Patient seen and examined bedside, resting comfortably.  Sitter present.  No issues overnight.  Remains confused but more oriented this morning than previous days.  No further agitation overnight.  Eating breakfast.  No other complaints or concerns at this time.  Patient is currently medically stable for transfer to inpatient psychiatry when bed available. Denies headache, no chest pain, no palpitations, no shortness of breath, no abdominal pain.  No other acute concerns overnight per nursing staff.  Objective: Vitals:   06/14/20 1713 06/14/20 1921 06/15/20 0235 06/15/20 0756  BP: (!) 140/93 Marland Kitchen)  128/96 113/82 (!) 159/94  Pulse: 80 82 81 82  Resp: 20 17 17 18   Temp: 99.1 F (37.3 C) 98.3 F (36.8 C)  98.6 F (37 C)  TempSrc: Oral     SpO2: 100% 97% 98% 98%  Weight:      Height:        Intake/Output Summary (Last 24 hours) at 06/15/2020 1018 Last data filed  at 06/14/2020 1315 Gross per 24 hour  Intake 240 ml  Output --  Net 240 ml   Filed Weights   06/01/20 1512 06/03/20 2145 06/13/20 0600  Weight: 79.4 kg 66.9 kg 66.5 kg    Examination:  General exam: Appears calm and comfortable Respiratory system: Clear to auscultation. Respiratory effort normal.  Oxygenating well on room air Cardiovascular system: S1 & S2 heard, RRR. No JVD, murmurs, rubs, gallops or clicks. No pedal edema. Gastrointestinal system: Abdomen is nondistended, soft and nontender. No organomegaly or masses felt. Normal bowel sounds heard. Central nervous system: Alert, oriented to place [facility]), Person ([facility]), but not time (2022) and not situation. No focal neurological deficits. Extremities: Symmetric 5 x 5 power. Skin: No rashes, lesions or ulcers Psychiatry: Judgement and insight appear poor     Data Reviewed: I have personally reviewed following labs and imaging studies  CBC: Recent Labs  Lab 06/11/20 0400 06/12/20 0501 06/13/20 0558 06/14/20 0558 06/15/20 0613  WBC 9.1 10.2 11.6* 11.3* 11.6*  NEUTROABS 4.4 6.5 7.9* 7.2 7.5  HGB 12.5* 11.8* 11.0* 10.6* 11.0*  HCT 37.1* 33.4* 31.4* 30.6* 32.6*  MCV 102.8* 97.7 98.4 100.0 101.6*  PLT 272 331 370 402* 436*   Basic Metabolic Panel: Recent Labs  Lab 06/11/20 0400 06/12/20 0501 06/13/20 0558 06/14/20 0558 06/15/20 0613  NA 138 139 140 141 139  K 3.6 3.3* 3.7 3.6 3.7  CL 103 99 104 107 106  CO2 28 28 27 30 25   GLUCOSE 111* 98 109* 94 96  BUN <5* <5* <5* 6 8  CREATININE 0.59* 0.61 0.59* 0.68 0.54*  CALCIUM 8.6* 8.9 8.7* 8.6* 9.1  MG 1.6* 1.7 2.1 1.8 1.9  PHOS 2.2* 4.1 2.8 3.4 4.3   GFR: Estimated Creatinine Clearance: 106.2 mL/min (A) (by C-G formula based on SCr of 0.54 mg/dL (L)). Liver Function Tests: Recent Labs  Lab 06/11/20 0400 06/12/20 0501 06/13/20 0558 06/14/20 0558 06/15/20 0613  ALBUMIN 3.2* 3.3* 3.2* 3.4* 3.4*   No results for input(s): LIPASE, AMYLASE in the last  168 hours. No results for input(s): AMMONIA in the last 168 hours. Coagulation Profile: No results for input(s): INR, PROTIME in the last 168 hours. Cardiac Enzymes: No results for input(s): CKTOTAL, CKMB, CKMBINDEX, TROPONINI in the last 168 hours. BNP (last 3 results) No results for input(s): PROBNP in the last 8760 hours. HbA1C: No results for input(s): HGBA1C in the last 72 hours. CBG: Recent Labs  Lab 06/13/20 2104 06/14/20 0742 06/14/20 1158 06/14/20 2043 06/15/20 0756  GLUCAP 88 86 91 94 78   Lipid Profile: No results for input(s): CHOL, HDL, LDLCALC, TRIG, CHOLHDL, LDLDIRECT in the last 72 hours. Thyroid Function Tests: No results for input(s): TSH, T4TOTAL, FREET4, T3FREE, THYROIDAB in the last 72 hours. Anemia Panel: No results for input(s): VITAMINB12, FOLATE, FERRITIN, TIBC, IRON, RETICCTPCT in the last 72 hours. Sepsis Labs: No results for input(s): PROCALCITON, LATICACIDVEN in the last 168 hours.  Recent Results (from the past 240 hour(s))  CULTURE, BLOOD (ROUTINE X 2) w Reflex to ID Panel  Status: None   Collection Time: 06/07/20  3:48 PM   Specimen: BLOOD  Result Value Ref Range Status   Specimen Description BLOOD BLOOD RIGHT HAND  Final   Special Requests   Final    BOTTLES DRAWN AEROBIC AND ANAEROBIC Blood Culture adequate volume   Culture   Final    NO GROWTH 5 DAYS Performed at [facility], [address] [place], [place] [address]    Report Status 06/12/2020 FINAL  Final  CULTURE, BLOOD (ROUTINE X 2) w Reflex to ID Panel     Status: None   Collection Time: 06/07/20  3:55 PM   Specimen: BLOOD  Result Value Ref Range Status   Specimen Description BLOOD BLOOD LEFT HAND  Final   Special Requests   Final    BOTTLES DRAWN AEROBIC AND ANAEROBIC Blood Culture adequate volume   Culture   Final    NO GROWTH 5 DAYS Performed at [facility], [address], [place], [place] [address]    Report Status 06/12/2020 FINAL  Final          Radiology Studies: No results found.      Scheduled Meds: . amoxicillin-clavulanate  1 tablet Oral Q12H  . aspirin EC  81 mg Oral Daily  . benztropine  0.5 mg Oral BID  . chlordiazePOXIDE  50 mg Oral BID  . cloNIDine  0.1 mg Oral TID  . diazepam  5 mg Oral Q12H  . enoxaparin (LOVENOX) injection  40 mg Subcutaneous Q24H  . folic acid  1 mg Oral Daily  . multivitamin with minerals  1 tablet Oral Daily  . nicotine  21 mg Transdermal Daily  . OLANZapine zydis  10 mg Oral BID  . pneumococcal 23 valent vaccine  0.5 mL Intramuscular Tomorrow-1000  . tamsulosin  0.4 mg Oral QPC supper  . thiamine  100 mg Oral Daily   Or  . thiamine  100 mg Intravenous Daily   Continuous Infusions: . sodium chloride       LOS: 11 days    Time spent: 36 minutes spent on chart review, discussion with nursing staff, consultants, updating family and interview/physical exam; more than 50% of that time was spent in counseling and/or coordination of care.    Alvira Philips [place], DO [facility] Available via Epic secure chat 7am-7pm After these hours, please refer to coverage provider listed on [email or web address] 06/15/2020, 10:18 AM

## 2020-06-15 NOTE — Progress Notes (Signed)
Speech Language Pathology Treatment: Dysphagia  Patient Details Name: Jason Atkins MRN: [id number] DOB: 09/29/1971 Today's Date: 06/15/2020 Time: 8563-1497 SLP Time Calculation (min) (ACUTE ONLY): 45 min  Assessment / Plan / Recommendation Clinical Impression  Pt seen for ongoing assessment of swallowing.Pt continues to present w/ declined mental/Cognitive status and requires Min-Mod cues for follow through w/ tasks at times. Pt is a chronic ETOH abuser per chart notes.He appears much more alert today; verbally responsive and able to follow instructions w/ less verbal/tactile cues.Pt is on RA; wbc elevated at 11.6. Sitter present in room. During tasks, pt exhibited decreased awareness during self-feeding but corrected behavior and followed cues for single sips agreeing he didn't want to "choke" while drinking.  Pt re-explained general aspiration precautions and agreed verbally to the need for following them especially sitting upright for all oral intake. Pt assisted w/ positioning to EOB then supported as needed d/t overall weakness. He could not recall general aspiration precautions independently w/in 5-10 mins during session. Noted same min. laryngeal-pharyngeal phlegm and instructed pt on coughing/expectorating as needed. Pt fed self trials of Nectar liquids and soft solids. No immediate,overt clinical s/s of aspiration were noted w/ any consistency; respiratory status remained calm and unlabored, vocal quality clear b/t trials. Pt then given trials of thin liquids via CUP w/ same precautions instructed. A delayed cough was noted x2-3 b/t trials then overt, strong coughing x1 post trials of thin liquids. NO straws were utiilized for better oral control. During the oral phase,control of thin liquid boluses was decreased and anterior leakage noted -- suspect this could have been impacted by pt's looking down at times post sips of liquids. Pt was NOT instructed to look down nor did he correct this to  hold his head midline -- he almost seemed unaware of some of the leakage occurring speaking to his declined mental status continuing. Oral clearing was achieved w/ all consistencies and mastication of soft solids adequate given Time and alternating foods/liquids.Pt is missing some Dentition; poor condition. Due to pt's need for verbal/tactile cues, declined mental/Cognitive status' s/p illness, and concern for aspiration w/ thin liquid consistency, recommend remaining on a Dysphagia level 3 diet (mech soft) w/ gravies added to moisten foodsand Nectar liquids via Cup. Recommend general aspiration precautions; Pills Whole in Puree; tray setup and positioning assistance for meals.Support at meals/oral intake at this time = Sitter present. Recommend f/u w/ objective swallow study, MBS, tomorrow for further assessment of pharyngeal phase of swallowing d/t presentation w/ thin liquids today. ST services updated NSG. Precautions posted at bedside.     HPI HPI: Pt is a 49yo male admitted 06/09/20 with alcohol withdrawal, homicidal ideation. PMH: HTN, CAD, CABG, alcohol and tobacco abuse, hepatic encephalopathy. ETOH abuse - +heavily drinking, approximately 25-30 beers per day. Pt intubated 7/21-23/21 - self extubated. Pt is stable on RA w/ increasing alertness/attention. Poor Dentition status.      SLP Plan  Continue with current plan of care;MBS       Recommendations  Diet recommendations: Dysphagia 3 (mechanical soft);Nectar-thick liquid Liquids provided via: Cup;No straw Medication Administration: Whole meds with puree (for safer swallowing) Supervision: Patient able to self feed;Staff to assist with self feeding;Intermittent supervision to cue for compensatory strategies Compensations: Minimize environmental distractions;Slow rate;Small sips/bites;Lingual sweep for clearance of pocketing;Follow solids with liquid;Multiple dry swallows after each bite/sip Postural Changes and/or Swallow Maneuvers:  Seated upright 90 degrees;Upright 30-60 min after meal  General recommendations:  (Dietician f/u) Oral Care Recommendations: Oral care BID;Oral care before and after PO;Staff/trained caregiver to provide oral care Follow up Recommendations:  (TBD) SLP Visit Diagnosis: Dysphagia, oropharyngeal phase (R13.12) (mental status decline) Plan: Continue with current plan of care;MBS       GO                 Jerilynn Som, MS, CCC-SLP Theran Vandergrift 06/15/2020, 4:50 PM

## 2020-06-15 NOTE — Evaluation (Signed)
Physical Therapy Evaluation Patient Details Name: Jason Atkins MRN: [id number] DOB: 1970/12/23 Today's Date: 06/15/2020   History of Present Illness  Pt is 49 yo male admitted 06/09/20 with alcohol withdrawal, homicidal ideation. PMH: HTN, CAD, CABG, alcohol and tobacco abuse, hepatic encephalopathy.     Clinical Impression  Pt is pleasant 49 y.o. male referred to therapy for general ambulation improvement.  Pt received in bed sleeping, and was able to awake and become oriented to person and DOB.  Pt does mumble significantly throughout treatment session and was difficult to understand.  Pt was able to perform exercises in bed listed below in order to strengthen B LEs.  Pt then asked to come to the EOb in sitting and was able to perform with no assistance.  Pt did require some minA in order to stand from seated EOB with verbal cuing for hand placement and with front weight placement on walker in order to prevent it from tipping over.  Once pt was upright, he was able to do standing marches and weight shift without any reports to dizziness.  Pt then proceeded to take small steps towards the door.  Pt did experience one instance of LOB when stepping away from the bed and required modA to prevent uncontrolled descent.  Once pt was able to continue moving forward, he performed much better than initial steps.  Pt able to walk 120' toward the nursing station before returning to the room.  Pt attempted to sit prior to having legs squared and was educated on proper controlled descent onto bed.  Pt understood and was left in bed with sitter present.  Pt encouraged to let us know if he needed anything.  Pt would continue to benefit from skilled therapy to address generalized muscle weakness present.  Pt will benefit from PT services in a HHPT setting upon discharge to safely address deficits listed in patient problem list for decreased caregiver assistance and eventual return to PLOF.      Follow Up  Recommendations Home health PT    Equipment Recommendations  Rolling walker with 5" wheels    Recommendations for Other Services       Precautions / Restrictions Precautions Precautions: None Restrictions Weight Bearing Restrictions: No      Mobility  Bed Mobility Overal bed mobility: Independent             General bed mobility comments: Pt able to navigate bed independently and move from supine to sitting EOb with no difficulty.  Transfers Overall transfer level: Needs assistance Equipment used: Rolling walker (2 wheeled) Transfers: Sit to/from Stand Sit to Stand: Min assist         General transfer comment: Pt needed verbal cuing for hand placement on bed, and to assist in holding walker down to prevent tipping.  Ambulation/Gait Ambulation/Gait assistance: Modified independent (Device/Increase time) Gait Distance (Feet): 120 Feet Assistive device: Rolling walker (2 wheeled) Gait Pattern/deviations: Step-through pattern;Decreased step length - right;Decreased step length - left;Decreased stance time - right;Decreased stance time - left;Leaning posteriorly Gait velocity: decreased   General Gait Details: Pt performed well with gait once started, but did have initial instance upon standing when he attempted to lean posteriorly and therapist had to prevent pt from uncontrolled descent.  Stairs            Wheelchair Mobility    Modified Rankin (Stroke Patients Only)       Balance Overall balance assessment: Needs assistance Sitting-balance support: No upper extremity supported;Feet supported Sitting balance-Leahy  Scale: Good   Postural control: Posterior lean Standing balance support: Bilateral upper extremity supported;During functional activity Standing balance-Leahy Scale: Fair Standing balance comment: Pt has posterior lean into bed upon standing.                             Pertinent Vitals/Pain Pain Assessment: No/denies pain     Home Living Family/patient expects to be discharged to:: Private residence Living Arrangements: Spouse/significant other Available Help at Discharge: Family Type of Home: House Home Access: Stairs to enter   [employer] of Steps: 12 steps, unable to clarify if he has any railing. Home Layout: Two level;Able to live on main level with bedroom/bathroom Home Equipment: None Additional Comments: Pt states his father-in-law has a walker since he is an amputee, but pt does not own a walker or any other device.    Prior Function Level of Independence: Independent               Hand Dominance   Dominant Hand: Right    Extremity/Trunk Assessment   Upper Extremity Assessment Upper Extremity Assessment: Overall WFL for tasks assessed    Lower Extremity Assessment Lower Extremity Assessment: Generalized weakness;LLE deficits/detail LLE Deficits / Details: Generalized weakness of B LE. LLE Sensation: WNL LLE Coordination: WNL    Cervical / Trunk Assessment Cervical / Trunk Assessment: Kyphotic  Communication   Communication: No difficulties  Cognition Arousal/Alertness: Lethargic Behavior During Therapy: WFL for tasks assessed/performed Overall Cognitive Status: Within Functional Limits for tasks assessed                                 General Comments: Pt was lethargic and unable to recall home environment at times.  Pt often mumbles when responding and is difficult to comprehend.      General Comments      Exercises Total Joint Exercises Ankle Circles/Pumps: AROM;Strengthening;Both;10 reps Hip ABduction/ADduction: AROM;Strengthening;Both;10 reps Straight Leg Raises: AROM;Strengthening;Both;10 reps Marching in Standing: AROM;Both;10 reps   Assessment/Plan    PT Assessment Patient needs continued PT services  PT Problem List Decreased strength;Decreased mobility;Decreased safety awareness;Decreased activity tolerance;Decreased knowledge  of use of DME;Decreased balance       PT Treatment Interventions DME instruction;Therapeutic exercise;Gait training;Balance training;Stair training;Neuromuscular re-education;Functional mobility training;Therapeutic activities;Patient/family education    PT Goals (Current goals can be found in the Care Plan section)  Acute Rehab PT Goals Patient Stated Goal: To get stronger and be able to walk. PT Goal Formulation: With patient Time For Goal Achievement: 06/29/20 Potential to Achieve Goals: Good    Frequency Min 2X/week   Barriers to discharge        Co-evaluation               AM-PAC PT "6 Clicks" Mobility  Outcome Measure Help needed turning from your back to your side while in a flat bed without using bedrails?: None Help needed moving from lying on your back to sitting on the side of a flat bed without using bedrails?: None Help needed moving to and from a bed to a chair (including a wheelchair)?: A Little Help needed standing up from a chair using your arms (e.g., wheelchair or bedside chair)?: A Little Help needed to walk in hospital room?: A Little Help needed climbing 3-5 steps with a railing? : A Lot 6 Click Score: 19    End of Session Equipment Utilized During  Treatment: Gait belt Activity Tolerance: Patient tolerated treatment well;Patient limited by fatigue Patient left: in bed;with call bell/phone within reach;with nursing/sitter in room Nurse Communication: Mobility status PT Visit Diagnosis: Unsteadiness on feet (R26.81);Other abnormalities of gait and mobility (R26.89);Muscle weakness (generalized) (M62.81);Difficulty in walking, not elsewhere classified (R26.2)    Time: 3361-2244 PT Time Calculation (min) (ACUTE ONLY): 30 min   Charges:   PT Evaluation $PT Eval Low Complexity: 1 Low PT Treatments $Gait Training: 8-22 mins $Therapeutic Exercise: 8-22 mins        Nolon Bussing, PT, DPT 06/15/20, 11:37 AM

## 2020-06-16 ENCOUNTER — Inpatient Hospital Stay: Payer: Self-pay

## 2020-06-16 DIAGNOSIS — D539 Nutritional anemia, unspecified: Secondary | ICD-10-CM

## 2020-06-16 LAB — CBC WITH DIFFERENTIAL/PLATELET
Abs Immature Granulocytes: 0.11 10*3/uL — ABNORMAL HIGH (ref 0.00–0.07)
Basophils Absolute: 0.2 10*3/uL — ABNORMAL HIGH (ref 0.0–0.1)
Basophils Relative: 2 %
Eosinophils Absolute: 0.4 10*3/uL (ref 0.0–0.5)
Eosinophils Relative: 3 %
HCT: 33 % — ABNORMAL LOW (ref 39.0–52.0)
Hemoglobin: 11 g/dL — ABNORMAL LOW (ref 13.0–17.0)
Immature Granulocytes: 1 %
Lymphocytes Relative: 17 %
Lymphs Abs: 2 10*3/uL (ref 0.7–4.0)
MCH: 34.1 pg — ABNORMAL HIGH (ref 26.0–34.0)
MCHC: 33.3 g/dL (ref 30.0–36.0)
MCV: 102.2 fL — ABNORMAL HIGH (ref 80.0–100.0)
Monocytes Absolute: 1.4 10*3/uL — ABNORMAL HIGH (ref 0.1–1.0)
Monocytes Relative: 13 %
Neutro Abs: 7.2 10*3/uL (ref 1.7–7.7)
Neutrophils Relative %: 64 %
Platelets: 470 10*3/uL — ABNORMAL HIGH (ref 150–400)
RBC: 3.23 MIL/uL — ABNORMAL LOW (ref 4.22–5.81)
RDW: 12.7 % (ref 11.5–15.5)
WBC: 11.3 10*3/uL — ABNORMAL HIGH (ref 4.0–10.5)
nRBC: 0 % (ref 0.0–0.2)

## 2020-06-16 LAB — RENAL FUNCTION PANEL
Albumin: 3.5 g/dL (ref 3.5–5.0)
Anion gap: 9 (ref 5–15)
BUN: 12 mg/dL (ref 6–20)
CO2: 27 mmol/L (ref 22–32)
Calcium: 9.2 mg/dL (ref 8.9–10.3)
Chloride: 103 mmol/L (ref 98–111)
Creatinine, Ser: 0.58 mg/dL — ABNORMAL LOW (ref 0.61–1.24)
GFR calc Af Amer: >60 mL/min (ref >60)
GFR calc non Af Amer: >60 mL/min (ref >60)
Glucose, Bld: 91 mg/dL (ref 70–99)
Phosphorus: 4.3 mg/dL (ref 2.5–4.6)
Potassium: 3.6 mmol/L (ref 3.5–5.1)
Sodium: 139 mmol/L (ref 135–145)

## 2020-06-16 LAB — GLUCOSE, CAPILLARY
Glucose-Capillary: 101 mg/dL — ABNORMAL HIGH (ref 70–99)
Glucose-Capillary: 104 mg/dL — ABNORMAL HIGH (ref 70–99)
Glucose-Capillary: 110 mg/dL — ABNORMAL HIGH (ref 70–99)
Glucose-Capillary: 111 mg/dL — ABNORMAL HIGH (ref 70–99)

## 2020-06-16 LAB — MAGNESIUM: Magnesium: 1.8 mg/dL (ref 1.7–2.4)

## 2020-06-16 MED ORDER — OLANZAPINE 10 MG PO TBDP
20.0000 mg | ORAL_TABLET | Freq: Every day | ORAL | Status: DC
  Administered 2020-06-17 – 2020-06-22 (×5): 20 mg via ORAL
  Filled 2020-06-16 (×6): qty 2

## 2020-06-16 MED ORDER — HALOPERIDOL 2 MG PO TABS
2.5000 mg | ORAL_TABLET | Freq: Two times a day (BID) | ORAL | Status: DC
  Administered 2020-06-16 – 2020-06-22 (×12): 2.5 mg via ORAL
  Filled 2020-06-16 (×13): qty 1

## 2020-06-16 NOTE — Progress Notes (Signed)
   06/16/20 1400  Clinical Encounter Type  Visited With Patient  Visit Type Initial;Spiritual support;Social support  Referral From Chaplain  Consult/Referral To Chaplain  Ch visited Pt while rounding unit. Pt was concern about visit from Ch. I explain to Pt that I was not there for any thing medical, I was there for support for him. Pt said he felt a lot better about me and his health. I will follow-up with Pt.

## 2020-06-16 NOTE — Evaluation (Addendum)
Objective Swallowing Evaluation: Type of Study: MBS-Modified Barium Swallow Study   Patient Details  Name: Jason Atkins MRN: [id number] Date of Birth: [birth year]  Today's Date: 06/16/2020 Time: SLP Start Time (ACUTE ONLY): 0930 -SLP Stop Time (ACUTE ONLY): 1030  SLP Time Calculation (min) (ACUTE ONLY): 60 min   Past Medical History:  Past Medical History:  Diagnosis Date  . Alcohol abuse   . Bipolar disorder (HCC)   . Coronary artery disease   . Heart attack (HCC)    x4  . Hx of CABG   . Hypertension   . Tobacco abuse    Past Surgical History:  Past Surgical History:  Procedure Laterality Date  . CARDIAC SURGERY    . CORONARY ARTERY BYPASS GRAFT     HPI: Pt is a 49yo male admitted 06/09/20 with alcohol withdrawal, homicidal ideation. PMH: HTN, CAD, CABG, alcohol and tobacco abuse, hepatic encephalopathy. ETOH abuse - +heavily drinking, approximately 25-30 beers per day. Pt intubated 7/21-23/21 - self extubated. Pt is stable on RA w/ increasing alertness/attention. Poor Dentition status.   Subjective: pt awake, verbally engaged w/ SLP. Confusion noted in some behaviors, responses.    Assessment / Plan / Recommendation  CHL IP CLINICAL IMPRESSIONS 06/16/2020  Clinical Impression Pt appears to present w/ oropharyngeal phase dysphagia w/ Neuromuscular deficits impacting safe swallowing. Pt is at increased risk for aspiration during oral intake. Pt was admitted for ETOH abuse - +heavily drinking, approximately 25-30 beers per day. Pt was intubated 7/21-23/21; he self extubated. He has maintained adequate toleration of a dysphagia level 3 w/ Nectar consistency liquids diet but exhibited inconsistent, clinical s/s of aspiration w/ thin liquid trials. Also, he often exhibits increased laryngeal-pharyngeal phlegm and phlegmy vocal quality. Pt is not consistently cognizent of this and does not make consistent attempts to clear his throat. He requires Min-Mod verbal/tactile cues for follow  through w/ instructions d/t declined mental status during this admit.  During the oral phase, mildly decreased bolus control w/ premature spillage of liquid bolus consistencies, moreso thin liquids. Mildly diffuse oral residue remained post all trial consistencies including BOT. Pt was able to reduce this residue w/ f/u, Dry swallows. Min increased mastication effort w/ increased textured trial consistencies suspect impacted primarily by pt's lack if native Dentition(missing several). Given time, pt manipulated boluses for mastication then transferred for swallowing. During the pharyngeal phase, pt exhibited delayed pharyngeal swallow initiation w/ all liquid consistencies given w/ reduced airway closure at the time liquid boluses were moving through the pharynx. This resulted in laryngeal Penetration of both thin and Nectar consistency liquid; thin liquids appeared to Penetrate more deeply w/ Aspiration noted x2. Pt was not immediately sensate to the Aspiration. Pt was instructed on using a strong throat clear/cough w/ f/u swallow to aid clearing -- pt was inconsistent on his immediate follow through w/ these instructions to make them more effective as strategies. Of note, he was able to reduce Penetrated material when he utilized the strategies efficiently. During the pharyngeal swallow, reduced pharyngeal pressure and laryngeal excursion w/ anterior movement was noted resulting in reduced, tight airway closure and both Valleculae and Pyriform Sinus residue. Pt utilized f/u, Dry swallows which reduced the pharyngeal residue but did not clear. This presentation resulted in slight+ amount of Epiglottic Undercoating and laryngeal Penetration BETWEEN po trials; pt was not sensate to this sligh amount of material in the laryngeal vestibule. He was instructed on using strategies of throat clear/cough intermittently and at end of po's to aid airway  protection. This presentation increases risk for Penetration/Aspiration  of pharyngeal residue AFTER the swallow which can impact Pulmonary status over time.  The results and recommendations of this study were discussed w/ MD and NSG post study; all agreed on remaining on current diet w/ aspiration precautions and swallowing strategies in place. Repeat MBSS in ~4 weeks. Posted precautions in room.  SLP Visit Diagnosis Dysphagia, oropharyngeal phase (R13.12)  Attention and concentration deficit following --  Frontal lobe and executive function deficit following --  Impact on safety and function Moderate aspiration risk;Risk for inadequate nutrition/hydration      CHL IP TREATMENT RECOMMENDATION 06/16/2020  Treatment Recommendations Therapy as outlined in treatment plan below     Prognosis 06/16/2020  Prognosis for Safe Diet Advancement Fair  Barriers to Reach Goals Cognitive deficits;Time post onset;Behavior;Severity of deficits  Barriers/Prognosis Comment --    CHL IP DIET RECOMMENDATION 06/16/2020  SLP Diet Recommendations Dysphagia 3 (Mech soft) solids;Nectar thick liquid  Liquid Administration via Cup;No straw  Medication Administration Whole meds with puree  Compensations Minimize environmental distractions;Slow rate;Small sips/bites;Lingual sweep for clearance of pocketing;Multiple dry swallows after each bite/sip;Follow solids with liquid;Clear throat intermittently;Effortful swallow  Postural Changes Remain semi-upright after after feeds/meals (Comment);Seated upright at 90 degrees      CHL IP OTHER RECOMMENDATIONS 06/16/2020  Recommended Consults (No Data)  Oral Care Recommendations Oral care BID;Oral care before and after PO;Staff/trained caregiver to provide oral care  Other Recommendations Order thickener from pharmacy;Prohibited food (jello, ice cream, thin soups);Remove water pitcher;Have oral suction available      CHL IP FOLLOW UP RECOMMENDATIONS 06/16/2020  Follow up Recommendations Skilled Nursing facility      [facility] IP FREQUENCY AND DURATION  06/16/2020  Speech Therapy Frequency (ACUTE ONLY) min 2x/week  Treatment Duration 2 weeks           CHL IP ORAL PHASE 06/16/2020  Oral Phase Impaired  Oral - Pudding Teaspoon --  Oral - Pudding Cup --  Oral - Honey Teaspoon --  Oral - Honey Cup --  Oral - Nectar Teaspoon --  Oral - Nectar Cup 6  Oral - Nectar Straw --  Oral - Thin Teaspoon --  Oral - Thin Cup 5  Oral - Thin Straw --  Oral - Puree 3  Oral - Mech Soft 2  Oral - Regular --  Oral - Multi-Consistency --  Oral - Pill --  Oral Phase - Comment mildly decreased bolus control w/ premature spillage of liquid bolus consistencies, moreso thin liquids. Mildly diffuse oral residue remained post all trial consistencies including BOT. Pt was able to reduce this residue w/ f/u, Dry swallows. Min increased mastication effort w/ increased textured trial consistencies suspect impacted primarily by pt's lack if native Dentition(missing several). Given time, pt manipulated boluses for mastication then transferred for swallowing.     CHL IP PHARYNGEAL PHASE 06/16/2020  Pharyngeal Phase Impaired  Pharyngeal- Pudding Teaspoon --  Pharyngeal --  Pharyngeal- Pudding Cup --  Pharyngeal --  Pharyngeal- Honey Teaspoon --  Pharyngeal --  Pharyngeal- Honey Cup --  Pharyngeal --  Pharyngeal- Nectar Teaspoon --  Pharyngeal --  Pharyngeal- Nectar Cup 6  Pharyngeal --  Pharyngeal- Nectar Straw --  Pharyngeal --  Pharyngeal- Thin Teaspoon --  Pharyngeal --  Pharyngeal- Thin Cup 5  Pharyngeal --  Pharyngeal- Thin Straw --  Pharyngeal --  Pharyngeal- Puree 3  Pharyngeal --  Pharyngeal- Mechanical Soft 2  Pharyngeal --  Pharyngeal- Regular --  Pharyngeal --  Pharyngeal- Multi-consistency --  Pharyngeal --  Pharyngeal- Pill --  Pharyngeal --  Pharyngeal Comment pt exhibited delayed pharyngeal swallow initiation w/ all liquid consistencies given w/ reduced airway closure at the time liquid boluses were moving through the pharynx. This  resulted in laryngeal Penetration of both thin and Nectar consistency liquid; thin liquids appeared to Penetrate more deeply w/ Aspiration noted x2. Pt was not immediately sensate to the Aspiration. Pt was instructed on using a strong throat clear/cough w/ f/u swallow to aid clearing -- pt was inconsistent on his immediate follow through w/ these instructions to make them more effective as strategies. Of note, he was able to reduce Penetrated material when he utilized the strategies efficiently. During the pharyngeal swallow, reduced pharyngeal pressure and laryngeal excursion w/ anterior movement was noted resulting in reduced, tight airway closure and both Valleculae and Pyriform Sinus residue. Pt utilized f/u, Dry swallows which reduced the pharyngeal residue but did not clear. This presentation resulted in slight+ amount of Epiglottic Undercoating and laryngeal Penetration BETWEEN po trials; pt was not sensate to this sligh amount of material in the laryngeal vestibule. He was instructed on using strategies of throat clear/cough intermittently and at end of po's to aid airway protection. This presentation increases risk for Penetration/Aspiration of pharyngeal residue AFTER the swallow which can impact Pulmonary status over time.      CHL IP CERVICAL ESOPHAGEAL PHASE 06/16/2020  Cervical Esophageal Phase WFL  Pudding Teaspoon --  Pudding Cup --  Honey Teaspoon --  Honey Cup --  Nectar Teaspoon --  Nectar Cup --  Nectar Straw --  Thin Teaspoon --  Thin Cup --  Thin Straw --  Puree --  Mechanical Soft --  Regular --  Multi-consistency --  Pill --  Cervical Esophageal Comment --        Jerilynn Som, MS, CCC-SLP Aniyah Nobis 06/16/2020, 2:05 PM

## 2020-06-16 NOTE — Progress Notes (Signed)
Patient ID: Jason Atkins, male   DOB: 01-Jun-1971, 49 y.o.   MRN: [id number]    [facility] MD Progress Note Huey Romans, MD    Referring Physician:    IM group  06/16/2020 4:16 PM Patient Identification: Jason Atkins MRN:  [id number]  Medical Diagnosis:  Principal Problem:   Alcohol withdrawal (HCC) Active Problems:   Alcohol abuse   Hypertension   Coronary artery disease   Thrombocytopenia (HCC)   Tobacco abuse   Abnormal LFTs   Protein-calorie malnutrition, severe   Alcohol use disorder, severe, dependence (HCC)   Major depressive disorder, recurrent severe without psychotic features (HCC)   Vitals: Blood pressure 112/80, pulse 89, temperature 98.4 F (36.9 C), temperature source Oral, resp. rate 18, height 5\' 11"  (1.803 m), weight 66.5 kg, SpO2 97 %.Body mass index is 20.45 kg/m.   Psychiatric Diagnosis: Alcohol withdrawal (HCC)  ETOH dependence   Bipolar mixed with psychosis  Family discord   Personality Disorder NOS     DAILY NARRATIVE  Patient requires inpatient psych intervention but needs PT clearance and also documented that he can do his ADl's  Before transfer.  Currently sedated due to earlier agitation   Meds adjusted today         Pertinent Psychiatric Protocols and Assessments: AIMS:  , ,  ,  ,       CIWA:  CIWA-Ar Total: 5   COWS:     PHYSICAL EXAM  Physical Exam  SYSTEMS REVIEW  Review of Systems  MENTAL STATUS: Pertinents Only     Patient currently sedated but answers to name MS limited Sitter says his mood and mood swings wax and wane.  Remains with poor insight judgement reliability \                      ADL's  Needs assistance   Sleep--sleep wake cycle disturbance    Cognition poor and currently declined   Appetite decreased   Movements sedated then agitated at times    Psychomotor Activity:  Waxes and wanes   strength & muscle tone:  Limited    gait & station: still improving with PT            Current Medications: Current Facility-Administered Medications  Medication Dose Route Frequency Provider Last Rate Last Admin  . 0.9 %  sodium chloride infusion  250 mL Intravenous Continuous , MD      . acetaminophen (TYLENOL) tablet 650 mg  650 mg Oral Q6H PRN Vida Rigger, Eric J, DO   650 mg at 06/13/20 1705  . aspirin EC tablet 81 mg  81 mg Oral Daily 06/15/20, MD   81 mg at 06/16/20 0856  . benztropine (COGENTIN) tablet 0.5 mg  0.5 mg Oral BID 06/18/20, MD   0.5 mg at 06/16/20 0856  . chlordiazePOXIDE (LIBRIUM) capsule 50 mg  50 mg Oral BID 06/18/20, MD   50 mg at 06/16/20 1131  . chlorproMAZINE (THORAZINE) tablet 10 mg  10 mg Oral TID PRN 06/18/20, NP   10 mg at 06/14/20 2216  . cloNIDine (CATAPRES) tablet 0.1 mg  0.1 mg Oral TID 2217, MD   0.1 mg at 06/15/20 2146  . enoxaparin (LOVENOX) injection 40 mg  40 mg Subcutaneous Q24H 2147, MD   40 mg at 06/15/20 2149  . folic acid (FOLVITE) tablet 1 mg  1 mg Oral Daily 2150, NP  1 mg at 06/16/20 0856  . haloperidol (HALDOL) tablet 2.5 mg  2.5 mg Oral BID Roselind Messierao, Maryem Shuffler, MD      . haloperidol lactate (HALDOL) injection 5 mg  5 mg Intravenous Q6H PRN [place], Eric J, DO   5 mg at 06/16/20 1248  . hydrALAZINE (APRESOLINE) injection 5 mg  5 mg Intravenous Q2H PRN Lorretta HarpNiu, Xilin, MD      . menthol-cetylpyridinium (CEPACOL) lozenge 3 mg  1 lozenge Oral PRN [place], Eric J, DO      . multivitamin with minerals tablet 1 tablet  1 tablet Oral Daily Harlon DittyKeene, Jeremiah D, NP   1 tablet at 06/16/20 0856  . nicotine (NICODERM CQ - dosed in mg/24 hours) patch 21 mg  21 mg Transdermal Daily Lorretta HarpNiu, Xilin, MD   21 mg at 06/16/20 0858  . [START ON 06/17/2020] OLANZapine zydis (ZYPREXA) disintegrating tablet 20 mg  20 mg Oral QHS Roselind Messierao, Zyier Dykema, MD      . ondansetron [facility](ZOFRAN) injection 4 mg  4 mg Intravenous Q8H PRN Lorretta HarpNiu, Xilin, MD      .  pneumococcal 23 valent vaccine (PNEUMOVAX-23) injection 0.5 mL  0.5 mL Intramuscular Tomorrow-1000 Sreenath, Sudheer B, MD      . tamsulosin (FLOMAX) capsule 0.4 mg  0.4 mg Oral QPC supper Lolita PatellaSreenath, Sudheer B, MD   0.4 mg at 06/15/20 1800  . thiamine tablet 100 mg  100 mg Oral Daily Lorretta HarpNiu, Xilin, MD   100 mg at 06/16/20 16100856   Or  . thiamine (B-1) injection 100 mg  100 mg Intravenous Daily Lorretta HarpNiu, Xilin, MD   100 mg at 06/12/20 1006    Lab Results:  Results for orders placed or performed during the hospital encounter of 06/01/20 (from the past 48 hour(s))  Glucose, capillary     Status: None   Collection Time: 06/14/20  8:43 PM  Result Value Ref Range   Glucose-Capillary 94 70 - 99 mg/dL    Comment: Glucose reference range applies only to samples taken after fasting for at least 8 hours.   Comment 1 Notify RN    Comment 2 Document in Chart   CBC with Differential/Platelet     Status: Abnormal   Collection Time: 06/15/20  6:13 AM  Result Value Ref Range   WBC 11.6 (H) 4.0 - 10.5 K/uL   RBC 3.21 (L) 4.22 - 5.81 MIL/uL   Hemoglobin 11.0 (L) 13.0 - 17.0 g/dL   HCT 96.032.6 (L) 39 - 52 %   MCV 101.6 (H) 80.0 - 100.0 fL   MCH 34.3 (H) 26.0 - 34.0 pg   MCHC 33.7 30.0 - 36.0 g/dL   RDW 45.412.4 09.811.5 - 11.915.5 %   Platelets 436 (H) 150 - 400 K/uL   nRBC 0.0 0.0 - 0.2 %   Neutrophils Relative % 66 %   Neutro Abs 7.5 1.7 - 7.7 K/uL   Lymphocytes Relative 15 %   Lymphs Abs 1.8 0.7 - 4.0 K/uL   Monocytes Relative 14 %   Monocytes Absolute 1.7 (H) 0 - 1 K/uL   Eosinophils Relative 3 %   Eosinophils Absolute 0.4 0 - 0 K/uL   Basophils Relative 1 %   Basophils Absolute 0.2 (H) 0 - 0 K/uL   Immature Granulocytes 1 %   Abs Immature Granulocytes 0.07 0.00 - 0.07 K/uL    Comment: Performed at [facility], [address] [place], [place] [address]  Renal function panel     Status: Abnormal   Collection Time:  06/15/20  6:13 AM  Result Value Ref Range   Sodium 139 135 - 145 mmol/L   Potassium 3.7  3.5 - 5.1 mmol/L   Chloride 106 98 - 111 mmol/L   CO2 25 22 - 32 mmol/L   Glucose, Bld 96 70 - 99 mg/dL    Comment: Glucose reference range applies only to samples taken after fasting for at least 8 hours.   BUN 8 6 - 20 mg/dL   Creatinine, Ser 6.04 (L) 0.61 - 1.24 mg/dL   Calcium 9.1 8.9 - 54.0 mg/dL   Phosphorus 4.3 2.5 - 4.6 mg/dL   Albumin 3.4 (L) 3.5 - 5.0 g/dL   GFR calc non Af Amer >60 >60 mL/min   GFR calc Af Amer >60 >60 mL/min   Anion gap 8 5 - 15    Comment: Performed at [facility], [address] [place], [place] [address]  Magnesium     Status: None   Collection Time: 06/15/20  6:13 AM  Result Value Ref Range   Magnesium 1.9 1.7 - 2.4 mg/dL    Comment: Performed at [facility], [address] [place], [place] [address]  Glucose, capillary     Status: None   Collection Time: 06/15/20  7:56 AM  Result Value Ref Range   Glucose-Capillary 78 70 - 99 mg/dL    Comment: Glucose reference range applies only to samples taken after fasting for at least 8 hours.  Glucose, capillary     Status: Abnormal   Collection Time: 06/15/20 12:06 PM  Result Value Ref Range   Glucose-Capillary 125 (H) 70 - 99 mg/dL    Comment: Glucose reference range applies only to samples taken after fasting for at least 8 hours.  Glucose, capillary     Status: None   Collection Time: 06/15/20  4:00 PM  Result Value Ref Range   Glucose-Capillary 92 70 - 99 mg/dL    Comment: Glucose reference range applies only to samples taken after fasting for at least 8 hours.  Glucose, capillary     Status: Abnormal   Collection Time: 06/15/20  9:26 PM  Result Value Ref Range   Glucose-Capillary 111 (H) 70 - 99 mg/dL    Comment: Glucose reference range applies only to samples taken after fasting for at least 8 hours.   Comment 1 Notify RN   CBC with Differential/Platelet     Status: Abnormal   Collection Time: 06/16/20  6:26 AM  Result Value Ref Range   WBC 11.3 (H) 4.0 - 10.5 K/uL    RBC 3.23 (L) 4.22 - 5.81 MIL/uL   Hemoglobin 11.0 (L) 13.0 - 17.0 g/dL   HCT 95.6 (L) 39 - 52 %   MCV 102.2 (H) 80.0 - 100.0 fL   MCH 34.1 (H) 26.0 - 34.0 pg   MCHC 33.3 30.0 - 36.0 g/dL   RDW 21.3 08.6 - 57.8 %   Platelets 470 (H) 150 - 400 K/uL   nRBC 0.0 0.0 - 0.2 %   Neutrophils Relative % 64 %   Neutro Abs 7.2 1.7 - 7.7 K/uL   Lymphocytes Relative 17 %   Lymphs Abs 2.0 0.7 - 4.0 K/uL   Monocytes Relative 13 %   Monocytes Absolute 1.4 (H) 0 - 1 K/uL   Eosinophils Relative 3 %   Eosinophils Absolute 0.4 0 - 0 K/uL   Basophils Relative 2 %   Basophils Absolute 0.2 (H) 0 - 0 K/uL   Immature Granulocytes 1 %  Abs Immature Granulocytes 0.11 (H) 0.00 - 0.07 K/uL    Comment: Performed at [facility], [address] [place], [place] [address]  Renal function panel     Status: Abnormal   Collection Time: 06/16/20  6:26 AM  Result Value Ref Range   Sodium 139 135 - 145 mmol/L   Potassium 3.6 3.5 - 5.1 mmol/L   Chloride 103 98 - 111 mmol/L   CO2 27 22 - 32 mmol/L   Glucose, Bld 91 70 - 99 mg/dL    Comment: Glucose reference range applies only to samples taken after fasting for at least 8 hours.   BUN 12 6 - 20 mg/dL   Creatinine, Ser 4.94 (L) 0.61 - 1.24 mg/dL   Calcium 9.2 8.9 - 49.6 mg/dL   Phosphorus 4.3 2.5 - 4.6 mg/dL   Albumin 3.5 3.5 - 5.0 g/dL   GFR calc non Af Amer >60 >60 mL/min   GFR calc Af Amer >60 >60 mL/min   Anion gap 9 5 - 15    Comment: Performed at [facility], [address] [place], [place] [address]  Magnesium     Status: None   Collection Time: 06/16/20  6:26 AM  Result Value Ref Range   Magnesium 1.8 1.7 - 2.4 mg/dL    Comment: Performed at [facility], [address] [place], [place] [address]  Glucose, capillary     Status: Abnormal   Collection Time: 06/16/20  7:52 AM  Result Value Ref Range   Glucose-Capillary 101 (H) 70 - 99 mg/dL    Comment: Glucose reference range applies only to samples taken after fasting  for at least 8 hours.  Glucose, capillary     Status: Abnormal   Collection Time: 06/16/20 12:52 PM  Result Value Ref Range   Glucose-Capillary 104 (H) 70 - 99 mg/dL    Comment: Glucose reference range applies only to samples taken after fasting for at least 8 hours.  Glucose, capillary     Status: Abnormal   Collection Time: 06/16/20  4:05 PM  Result Value Ref Range   Glucose-Capillary 110 (H) 70 - 99 mg/dL    Comment: Glucose reference range applies only to samples taken after fasting for at least 8 hours.    Blood Alcohol level:  Lab Results  Component Value Date   ETH 229 (H) 06/01/2020   ETH <10 07/28/2019    Metabolic Disorder Labs: Lab Results  Component Value Date   HGBA1C 4.8 07/28/2019   MPG 91.06 07/28/2019   No results found for: PROLACTIN Lab Results  Component Value Date   TRIG 116 06/09/2020     Past Psychiatric History:  Already discussed  Past Medical/Surgical History  Past Medical History:  Diagnosis Date  . Alcohol abuse   . Bipolar disorder (HCC)   . Coronary artery disease   . Heart attack (HCC)    x4  . Hx of CABG   . Hypertension   . Tobacco abuse     Past Surgical History:  Procedure Laterality Date  . CARDIAC SURGERY    . CORONARY ARTERY BYPASS GRAFT      Family Medical and Psychiatric History:   Family History  Problem Relation Age of Onset  . Diabetes Mellitus II Father   . Heart attack Father     Social History "Additional/Addendum":  Pending further collateral with family   Social History   Substance and Sexual Activity  Alcohol Use Yes   Comment: 2 40s  per day     Social History   Substance and Sexual Activity  Drug Use No    Social History   Socioeconomic History  . Marital status: Married    Spouse name: Not on file  . Number of children: Not on file  . Years of education: Not on file  . Highest education level: Not on file  Occupational History  . Not on file  Tobacco Use  . Smoking status: Current  Every Day Smoker    Packs/day: 0.50    Types: Cigarettes  . Smokeless tobacco: Never Used  Substance and Sexual Activity  . Alcohol use: Yes    Comment: 2 40s per day  . Drug use: No  . Sexual activity: Not on file  Other Topics Concern  . Not on file  Social History Narrative  . Not on file   Social Determinants of Health   Financial Resource Strain:   . Difficulty of Paying Living Expenses:   Food Insecurity:   . Worried About Programme researcher, broadcasting/film/video in the Last Year:   . Barista in the Last Year:   Transportation Needs:   . Freight forwarder (Medical):   Marland Kitchen Lack of Transportation (Non-Medical):   Physical Activity:   . Days of Exercise per Week:   . Minutes of Exercise per Session:   Stress:   . Feeling of Stress :   Social Connections:   . Frequency of Communication with Friends and Family:   . Frequency of Social Gatherings with Friends and Family:   . Attends Religious Services:   . Active Member of Clubs or Organizations:   . Attends Banker Meetings:   Marland Kitchen Marital Status:       Pain Medications: See PTA Prescriptions: See PTA Over the Counter: See PTA History of alcohol / drug use?: Yes Withdrawal Symptoms: Agitation                    Summary:    Mixed ethnic male revolving door admits Severe ETOH dependence and withdrawal along with bipolar disorder --IVC renewed today  I was covering inpatient --last 1.5 weekis -- Last IVC expired on the 20th   One to one continues  Awaits CW referral out but still needs PT clearance  Currently sedated with haldol after lashing out and trying to leave reportedly   No margin of clear safety to go home  Staff not present to handle in our unit at present      Prescription Plan/Interventions:    Haldol 2.5 bid Librium continues 50 bid Prn IM Haldol Cogentin 1 bid  Zyprexa changed to 20 qhs          Estimated Length of Stay:    Several days pending transfer   Needs  PT clearance before outside referral wil accept    Roselind Messier, MD 06/16/2020, 4:16 PM  Total Time spent with patient: 30-40

## 2020-06-16 NOTE — Progress Notes (Addendum)
PROGRESS NOTE    Jason Atkins  JSE:[id number] DOB: [birth year] DOA: 06/01/2020 PCP: Center, [facility]   Assessment & Plan:   Principal Problem:   Alcohol withdrawal (HCC) Active Problems:   Alcohol abuse   Hypertension   Coronary artery disease   Thrombocytopenia (HCC)   Tobacco abuse   Abnormal LFTs   Protein-calorie malnutrition, severe   Alcohol use disorder, severe, dependence (HCC)   Major depressive disorder, recurrent severe without psychotic features (HCC)  Alcohol withdrawal syndrome: drinks 25-30 beers daily. Previously on precedex & ativan for w/drawal. Transferred to the ICU on 7/21 and intubated and placed on sedation. Self extubated 7/23 and remained on precedex that has since been weaned off. Continue on librium, clonidine, valium. Continue on folic acid, multivitamin, thiamine. Alcohol cessation counseling   Bipolar disorder: unknown type or severity. Continue on zyprexa, benztropine  Homicidal ideation: had been reporting homicidal ideations at home. Placed on IVC, psychiatry following. Pending inpatient psychiatric transfer; medically stable for transfer  Dysphagia: continue on dysphagia diet as per speech   Aspiration pneumonia: complete a course of abx. Encourage incentive spirometry   Thrombocytosis: etiology unclear, likely reactive. Will continue to monitor   Macrocytic anemia: secondary to alcohol abuse. No need for a transfusion at this time. Will continue to monitor   CAD: s/p CABG. Continue on aspirn   Essential hypertension: continue on clonidine   Tobacco use disorder: nicotine patch to prevent w/drawal  BPH: continue on tamsulosin    DVT prophylaxis: lovenox  Code Status: full  Family Communication: discussed pt's care w/ pt's wife, Archie Patten, and answered her questions  Disposition Plan: likely d/c to inpatient psych facility   Status is: Inpatient  Remains inpatient appropriate because:Unsafe d/c plan   Dispo: The  patient is from: Home              Anticipated d/c is to: inpatient psych facility               Anticipated d/c date is: 2 days              Patient currently is medically stable to d/c.      Consultants:   Psych     Procedures:    Antimicrobials:    Subjective: Pt c/o watery eyes  Objective: Vitals:   06/15/20 0756 06/15/20 1603 06/15/20 1955 06/16/20 0329  BP: (!) 159/94 107/71 110/68 100/70  Pulse: 82 81 79 86  Resp: 18 16 17 17   Temp: 98.6 F (37 C) 98.6 F (37 C) 98 F (36.7 C) 97.9 F (36.6 C)  TempSrc:      SpO2: 98% 97% 96% 96%  Weight:      Height:       No intake or output data in the 24 hours ending 06/16/20 0739 Filed Weights   06/01/20 1512 06/03/20 2145 06/13/20 0600  Weight: 79.4 kg 66.9 kg 66.5 kg    Examination:  General exam: Appears calm and comfortable  Respiratory system: course breath sounds b/l. No wheezes Cardiovascular system: S1 & S2 +. No  rubs, gallops or clicks. Gastrointestinal system: Abdomen is nondistended, soft and nontender.  Normal bowel sounds heard. Central nervous system: Alert and awake. Moves all 4 extremities  Psychiatry: Judgement and insight appear abnormal.  Flat mood and affect     Data Reviewed: I have personally reviewed following labs and imaging studies  CBC: Recent Labs  Lab 06/12/20 0501 06/13/20 0558 06/14/20 0558 06/15/20 0613 06/16/20 0626  WBC 10.2  11.6* 11.3* 11.6* 11.3*  NEUTROABS 6.5 7.9* 7.2 7.5 7.2  HGB 11.8* 11.0* 10.6* 11.0* 11.0*  HCT 33.4* 31.4* 30.6* 32.6* 33.0*  MCV 97.7 98.4 100.0 101.6* 102.2*  PLT 331 370 402* 436* 470*   Basic Metabolic Panel: Recent Labs  Lab 06/12/20 0501 06/13/20 0558 06/14/20 0558 06/15/20 0613 06/16/20 0626  NA 139 140 141 139 139  K 3.3* 3.7 3.6 3.7 3.6  CL 99 104 107 106 103  CO2 28 27 30 25 27   GLUCOSE 98 109* 94 96 91  BUN <5* <5* 6 8 12   CREATININE 0.61 0.59* 0.68 0.54* 0.58*  CALCIUM 8.9 8.7* 8.6* 9.1 9.2  MG 1.7 2.1 1.8 1.9 1.8   PHOS 4.1 2.8 3.4 4.3 4.3   GFR: Estimated Creatinine Clearance: 106.2 mL/min (A) (by C-G formula based on SCr of 0.58 mg/dL (L)). Liver Function Tests: Recent Labs  Lab 06/12/20 0501 06/13/20 0558 06/14/20 0558 06/15/20 0613 06/16/20 0626  ALBUMIN 3.3* 3.2* 3.4* 3.4* 3.5   No results for input(s): LIPASE, AMYLASE in the last 168 hours. No results for input(s): AMMONIA in the last 168 hours. Coagulation Profile: No results for input(s): INR, PROTIME in the last 168 hours. Cardiac Enzymes: No results for input(s): CKTOTAL, CKMB, CKMBINDEX, TROPONINI in the last 168 hours. BNP (last 3 results) No results for input(s): PROBNP in the last 8760 hours. HbA1C: No results for input(s): HGBA1C in the last 72 hours. CBG: Recent Labs  Lab 06/14/20 2043 06/15/20 0756 06/15/20 1206 06/15/20 1600 06/15/20 2126  GLUCAP 94 78 125* 92 111*   Lipid Profile: No results for input(s): CHOL, HDL, LDLCALC, TRIG, CHOLHDL, LDLDIRECT in the last 72 hours. Thyroid Function Tests: No results for input(s): TSH, T4TOTAL, FREET4, T3FREE, THYROIDAB in the last 72 hours. Anemia Panel: No results for input(s): VITAMINB12, FOLATE, FERRITIN, TIBC, IRON, RETICCTPCT in the last 72 hours. Sepsis Labs: No results for input(s): PROCALCITON, LATICACIDVEN in the last 168 hours.  Recent Results (from the past 240 hour(s))  CULTURE, BLOOD (ROUTINE X 2) w Reflex to ID Panel     Status: None   Collection Time: 06/07/20  3:48 PM   Specimen: BLOOD  Result Value Ref Range Status   Specimen Description BLOOD BLOOD RIGHT HAND  Final   Special Requests   Final    BOTTLES DRAWN AEROBIC AND ANAEROBIC Blood Culture adequate volume   Culture   Final    NO GROWTH 5 DAYS Performed at [facility], [address] [place], [address] [place]    Report Status 06/12/2020 FINAL  Final  CULTURE, BLOOD (ROUTINE X 2) w Reflex to ID Panel     Status: None   Collection Time: 06/07/20  3:55 PM   Specimen: BLOOD   Result Value Ref Range Status   Specimen Description BLOOD BLOOD LEFT HAND  Final   Special Requests   Final    BOTTLES DRAWN AEROBIC AND ANAEROBIC Blood Culture adequate volume   Culture   Final    NO GROWTH 5 DAYS Performed at [facility], [address], [place], [address] [place]    Report Status 06/12/2020 FINAL  Final         Radiology Studies: No results found.      Scheduled Meds: . amoxicillin-clavulanate  1 tablet Oral Q12H  . aspirin EC  81 mg Oral Daily  . benztropine  0.5 mg Oral BID  . chlordiazePOXIDE  50 mg Oral BID  . cloNIDine  0.1 mg Oral  TID  . diazepam  5 mg Oral Q12H  . enoxaparin (LOVENOX) injection  40 mg Subcutaneous Q24H  . folic acid  1 mg Oral Daily  . multivitamin with minerals  1 tablet Oral Daily  . nicotine  21 mg Transdermal Daily  . OLANZapine zydis  10 mg Oral BID  . pneumococcal 23 valent vaccine  0.5 mL Intramuscular Tomorrow-1000  . tamsulosin  0.4 mg Oral QPC supper  . thiamine  100 mg Oral Daily   Or  . thiamine  100 mg Intravenous Daily   Continuous Infusions: . sodium chloride       LOS: 12 days    Time spent: 33 mins     Charise Killian, MD [facility] Pager [phone]  If 7PM-7AM, please contact night-coverage [email or web address] 06/16/2020, 7:39 AM

## 2020-06-17 DIAGNOSIS — F319 Bipolar disorder, unspecified: Secondary | ICD-10-CM

## 2020-06-17 LAB — CBC WITH DIFFERENTIAL/PLATELET
Abs Immature Granulocytes: 0.1 10*3/uL — ABNORMAL HIGH (ref 0.00–0.07)
Basophils Absolute: 0.2 10*3/uL — ABNORMAL HIGH (ref 0.0–0.1)
Basophils Relative: 2 %
Eosinophils Absolute: 0.4 10*3/uL (ref 0.0–0.5)
Eosinophils Relative: 4 %
HCT: 33 % — ABNORMAL LOW (ref 39.0–52.0)
Hemoglobin: 11 g/dL — ABNORMAL LOW (ref 13.0–17.0)
Immature Granulocytes: 1 %
Lymphocytes Relative: 22 %
Lymphs Abs: 2.2 10*3/uL (ref 0.7–4.0)
MCH: 34.1 pg — ABNORMAL HIGH (ref 26.0–34.0)
MCHC: 33.3 g/dL (ref 30.0–36.0)
MCV: 102.2 fL — ABNORMAL HIGH (ref 80.0–100.0)
Monocytes Absolute: 1.3 10*3/uL — ABNORMAL HIGH (ref 0.1–1.0)
Monocytes Relative: 13 %
Neutro Abs: 5.8 10*3/uL (ref 1.7–7.7)
Neutrophils Relative %: 58 %
Platelets: 509 10*3/uL — ABNORMAL HIGH (ref 150–400)
RBC: 3.23 MIL/uL — ABNORMAL LOW (ref 4.22–5.81)
RDW: 12.6 % (ref 11.5–15.5)
WBC: 10 10*3/uL (ref 4.0–10.5)
nRBC: 0 % (ref 0.0–0.2)

## 2020-06-17 LAB — RENAL FUNCTION PANEL
Albumin: 3.4 g/dL — ABNORMAL LOW (ref 3.5–5.0)
Anion gap: 12 (ref 5–15)
BUN: 13 mg/dL (ref 6–20)
CO2: 27 mmol/L (ref 22–32)
Calcium: 9 mg/dL (ref 8.9–10.3)
Chloride: 101 mmol/L (ref 98–111)
Creatinine, Ser: 0.58 mg/dL — ABNORMAL LOW (ref 0.61–1.24)
GFR calc Af Amer: >60 mL/min (ref >60)
GFR calc non Af Amer: >60 mL/min (ref >60)
Glucose, Bld: 88 mg/dL (ref 70–99)
Phosphorus: 4.1 mg/dL (ref 2.5–4.6)
Potassium: 3.6 mmol/L (ref 3.5–5.1)
Sodium: 140 mmol/L (ref 135–145)

## 2020-06-17 LAB — GLUCOSE, CAPILLARY
Glucose-Capillary: 80 mg/dL (ref 70–99)
Glucose-Capillary: 94 mg/dL (ref 70–99)
Glucose-Capillary: 103 mg/dL — ABNORMAL HIGH (ref 70–99)
Glucose-Capillary: 92 mg/dL (ref 70–99)

## 2020-06-17 LAB — MAGNESIUM: Magnesium: 1.8 mg/dL (ref 1.7–2.4)

## 2020-06-17 NOTE — Progress Notes (Addendum)
PROGRESS NOTE    Jason Atkins  TDD:[id number] DOB: 1971-07-07 DOA: 06/01/2020 PCP: [facility], [facility]   Assessment & Plan:   Principal Problem:   Alcohol withdrawal (HCC) Active Problems:   Alcohol abuse   Hypertension   Coronary artery disease   Thrombocytopenia (HCC)   Tobacco abuse   Abnormal LFTs   Protein-calorie malnutrition, severe   Alcohol use disorder, severe, dependence (HCC)   Major depressive disorder, recurrent severe without psychotic features (HCC)  Alcohol withdrawal syndrome: drinks 25-30 beers daily. Improving. Previously on precedex & ativan for w/drawal. Transferred to the ICU on 7/21 and intubated and placed on sedation. Self extubated 7/23 and remained on precedex that has since been weaned off. Continue on librium, clonidine, valium. Continue on folic acid, multivitamin, thiamine. Alcohol cessation counseling   Bipolar disorder: unknown type or severity. Continue on zyprexa, benztropine  Homicidal ideation: had been reporting homicidal ideations at home. Placed on IVC, psychiatry following. Medically stable for transfer to inpatient psych facility and CM is working on this   Dysphagia: continue on dysphagia diet as per speech   Aspiration pneumonia: complete a course of abx. Encourage incentive spirometry   Thrombocytosis: etiology unclear, likely reactive. Will continue to monitor   Macrocytic anemia: secondary to alcohol abuse. H&H are stable. No need for a transfusion at this time. Will continue to monitor   CAD: s/p CABG. Continue on aspirn   Essential hypertension: continue on clonidine   Tobacco use disorder: nicotine patch to prevent w/drawal  BPH: continue on flomax     DVT prophylaxis: lovenox  Code Status: full  Family Communication:  Disposition Plan: likely d/c to inpatient psych facility   Status is: Inpatient  Remains inpatient appropriate because:Unsafe d/c plan. Waiting to find an inpatient psych facility  for the pt to be d/c too    Dispo: The patient is from: Home              Anticipated d/c is to: inpatient psych facility               Anticipated d/c date is: 2 days              Patient currently is medically stable to d/c.      Consultants:   Psych     Procedures:    Antimicrobials:    Subjective: Pt c/o fatigue  Objective: Vitals:   06/16/20 1607 06/16/20 1733 06/16/20 2035 06/17/20 0458  BP: 112/80 101/75 109/84   Pulse: 89  86   Resp: 18     Temp: 98.4 F (36.9 C)     TempSrc: Oral     SpO2: 97%  98%   Weight:    66 kg  Height:        Intake/Output Summary (Last 24 hours) at 06/17/2020 0739 Last data filed at 06/16/2020 2335 Gross per 24 hour  Intake --  Output 500 ml  Net -500 ml   Filed Weights   06/03/20 2145 06/13/20 0600 06/17/20 0458  Weight: 66.9 kg 66.5 kg 66 kg    Examination:  General exam: Appears calm and comfortable  Respiratory system: decreased breath sounds b/l. No rales Cardiovascular system: S1 & S2 +. No  rubs, gallops. Gastrointestinal system: Abdomen is nondistended, soft and nontender.  Hypoactive bowel sounds heard. Central nervous system: Alert and awake. Moves all 4 extremities  Psychiatry: Judgement and insight appear inappropriate.  Flat mood and affect     Data Reviewed: I have personally reviewed  following labs and imaging studies  CBC: Recent Labs  Lab 06/13/20 0558 06/14/20 0558 06/15/20 0613 06/16/20 0626 06/17/20 0523  WBC 11.6* 11.3* 11.6* 11.3* 10.0  NEUTROABS 7.9* 7.2 7.5 7.2 5.8  HGB 11.0* 10.6* 11.0* 11.0* 11.0*  HCT 31.4* 30.6* 32.6* 33.0* 33.0*  MCV 98.4 100.0 101.6* 102.2* 102.2*  PLT 370 402* 436* 470* 509*   Basic Metabolic Panel: Recent Labs  Lab 06/13/20 0558 06/14/20 0558 06/15/20 0613 06/16/20 0626 06/17/20 0523  NA 140 141 139 139 140  K 3.7 3.6 3.7 3.6 3.6  CL 104 107 106 103 101  CO2 27 30 25 27 27   GLUCOSE 109* 94 96 91 88  BUN <5* 6 8 12 13   CREATININE 0.59* 0.68  0.54* 0.58* 0.58*  CALCIUM 8.7* 8.6* 9.1 9.2 9.0  MG 2.1 1.8 1.9 1.8 1.8  PHOS 2.8 3.4 4.3 4.3 4.1   GFR: Estimated Creatinine Clearance: 105.4 mL/min (A) (by C-G formula based on SCr of 0.58 mg/dL (L)). Liver Function Tests: Recent Labs  Lab 06/13/20 0558 06/14/20 0558 06/15/20 0613 06/16/20 0626 06/17/20 0523  ALBUMIN 3.2* 3.4* 3.4* 3.5 3.4*   No results for input(s): LIPASE, AMYLASE in the last 168 hours. No results for input(s): AMMONIA in the last 168 hours. Coagulation Profile: No results for input(s): INR, PROTIME in the last 168 hours. Cardiac Enzymes: No results for input(s): CKTOTAL, CKMB, CKMBINDEX, TROPONINI in the last 168 hours. BNP (last 3 results) No results for input(s): PROBNP in the last 8760 hours. HbA1C: No results for input(s): HGBA1C in the last 72 hours. CBG: Recent Labs  Lab 06/15/20 2126 06/16/20 0752 06/16/20 1252 06/16/20 1605 06/16/20 2122  GLUCAP 111* 101* 104* 110* 111*   Lipid Profile: No results for input(s): CHOL, HDL, LDLCALC, TRIG, CHOLHDL, LDLDIRECT in the last 72 hours. Thyroid Function Tests: No results for input(s): TSH, T4TOTAL, FREET4, T3FREE, THYROIDAB in the last 72 hours. Anemia Panel: No results for input(s): VITAMINB12, FOLATE, FERRITIN, TIBC, IRON, RETICCTPCT in the last 72 hours. Sepsis Labs: No results for input(s): PROCALCITON, LATICACIDVEN in the last 168 hours.  Recent Results (from the past 240 hour(s))  CULTURE, BLOOD (ROUTINE X 2) w Reflex to ID Panel     Status: None   Collection Time: 06/07/20  3:48 PM   Specimen: BLOOD  Result Value Ref Range Status   Specimen Description BLOOD BLOOD RIGHT HAND  Final   Special Requests   Final    BOTTLES DRAWN AEROBIC AND ANAEROBIC Blood Culture adequate volume   Culture   Final    NO GROWTH 5 DAYS Performed at [facility], [address] [place], [address] [place]    Report Status 06/12/2020 FINAL  Final  CULTURE, BLOOD (ROUTINE X 2) w Reflex to ID  Panel     Status: None   Collection Time: 06/07/20  3:55 PM   Specimen: BLOOD  Result Value Ref Range Status   Specimen Description BLOOD BLOOD LEFT HAND  Final   Special Requests   Final    BOTTLES DRAWN AEROBIC AND ANAEROBIC Blood Culture adequate volume   Culture   Final    NO GROWTH 5 DAYS Performed at [facility], [address] [place], [address] [place]    Report Status 06/12/2020 FINAL  Final         Radiology Studies: No results found.      Scheduled Meds: . aspirin EC  81 mg Oral Daily  . benztropine  0.5 mg Oral  BID  . chlordiazePOXIDE  50 mg Oral BID  . cloNIDine  0.1 mg Oral TID  . enoxaparin (LOVENOX) injection  40 mg Subcutaneous Q24H  . folic acid  1 mg Oral Daily  . haloperidol  2.5 mg Oral BID  . multivitamin with minerals  1 tablet Oral Daily  . nicotine  21 mg Transdermal Daily  . OLANZapine zydis  20 mg Oral QHS  . pneumococcal 23 valent vaccine  0.5 mL Intramuscular Tomorrow-1000  . tamsulosin  0.4 mg Oral QPC supper  . thiamine  100 mg Oral Daily   Or  . thiamine  100 mg Intravenous Daily   Continuous Infusions: . sodium chloride       LOS: 13 days    Time spent: 30 mins     Charise Killian, MD [facility] Pager [phone]  If 7PM-7AM, please contact night-coverage [email or web address] 06/17/2020, 7:39 AM

## 2020-06-17 NOTE — TOC Progression Note (Signed)
Transition of Care [facility]) - Progression Note    Patient Details  Name: Jason Atkins MRN: [id number] Date of Birth: 05-01-1971  [facility] of [facility]) CM/SW Contact  Trenton Founds, RN Phone Number: 06/17/2020, 10:24 AM  Clinical Narrative:   RNCM reached out to [facility] and faxed information to [phone] reached out to [facility] as well and faxed them at [phone].          Expected Discharge Plan and Services                                                 Social Determinants of Health (SDOH) Interventions    Readmission Risk Interventions No flowsheet data found.

## 2020-06-17 NOTE — Progress Notes (Signed)
Pt became agitated and combative wanting to look for his phone and glasses; attempt made to redirect and reorient patient.  Pt's agitation increased, he began using profanity, name calling and making verbal threats.  He picked up his walker and threw it and then walked over and kicked it.  Pt unsteady and continued to make an attempt to leave the room stating that he was going to his bedroom to look for his phone himself.  Pt administered IV dose of Haldol 5mg  at 2218; 1 hour and 47 minutes after receiving scheduled hs meds.  Medication was effective.

## 2020-06-18 LAB — CBC WITH DIFFERENTIAL/PLATELET
Abs Immature Granulocytes: 0.11 10*3/uL — ABNORMAL HIGH (ref 0.00–0.07)
Basophils Absolute: 0.2 10*3/uL — ABNORMAL HIGH (ref 0.0–0.1)
Basophils Relative: 2 %
Eosinophils Absolute: 0.5 10*3/uL (ref 0.0–0.5)
Eosinophils Relative: 4 %
HCT: 33.7 % — ABNORMAL LOW (ref 39.0–52.0)
Hemoglobin: 11.2 g/dL — ABNORMAL LOW (ref 13.0–17.0)
Immature Granulocytes: 1 %
Lymphocytes Relative: 19 %
Lymphs Abs: 2.5 10*3/uL (ref 0.7–4.0)
MCH: 33.9 pg (ref 26.0–34.0)
MCHC: 33.2 g/dL (ref 30.0–36.0)
MCV: 102.1 fL — ABNORMAL HIGH (ref 80.0–100.0)
Monocytes Absolute: 1.3 10*3/uL — ABNORMAL HIGH (ref 0.1–1.0)
Monocytes Relative: 10 %
Neutro Abs: 8.4 10*3/uL — ABNORMAL HIGH (ref 1.7–7.7)
Neutrophils Relative %: 64 %
Platelets: 543 10*3/uL — ABNORMAL HIGH (ref 150–400)
RBC: 3.3 MIL/uL — ABNORMAL LOW (ref 4.22–5.81)
RDW: 12.5 % (ref 11.5–15.5)
WBC: 13 10*3/uL — ABNORMAL HIGH (ref 4.0–10.5)
nRBC: 0 % (ref 0.0–0.2)

## 2020-06-18 LAB — RENAL FUNCTION PANEL
Albumin: 3.6 g/dL (ref 3.5–5.0)
Anion gap: 8 (ref 5–15)
BUN: 12 mg/dL (ref 6–20)
CO2: 27 mmol/L (ref 22–32)
Calcium: 9.1 mg/dL (ref 8.9–10.3)
Chloride: 105 mmol/L (ref 98–111)
Creatinine, Ser: 0.48 mg/dL — ABNORMAL LOW (ref 0.61–1.24)
GFR calc Af Amer: >60 mL/min (ref >60)
GFR calc non Af Amer: >60 mL/min (ref >60)
Glucose, Bld: 85 mg/dL (ref 70–99)
Phosphorus: 3.5 mg/dL (ref 2.5–4.6)
Potassium: 3.9 mmol/L (ref 3.5–5.1)
Sodium: 140 mmol/L (ref 135–145)

## 2020-06-18 LAB — MAGNESIUM: Magnesium: 1.8 mg/dL (ref 1.7–2.4)

## 2020-06-18 LAB — GLUCOSE, CAPILLARY
Glucose-Capillary: 83 mg/dL (ref 70–99)
Glucose-Capillary: 124 mg/dL — ABNORMAL HIGH (ref 70–99)
Glucose-Capillary: 106 mg/dL — ABNORMAL HIGH (ref 70–99)
Glucose-Capillary: 107 mg/dL — ABNORMAL HIGH (ref 70–99)

## 2020-06-18 NOTE — Progress Notes (Signed)
Pt pleasant and cooperative while awake during shift and rested well at HS only waking up to utilize the bathroom and then back to sleep without difficulty.

## 2020-06-18 NOTE — Progress Notes (Signed)
PROGRESS NOTE    Jason Atkins  WCH:[id number] DOB: 08-09-1971 DOA: 06/01/2020 PCP: [facility], [facility]   Assessment & Plan:   Principal Problem:   Alcohol withdrawal (HCC) Active Problems:   Alcohol abuse   Hypertension   Coronary artery disease   Thrombocytopenia (HCC)   Tobacco abuse   Abnormal LFTs   Protein-calorie malnutrition, severe   Alcohol use disorder, severe, dependence (HCC)   Major depressive disorder, recurrent severe without psychotic features (HCC)  Alcohol withdrawal syndrome: drinks 25-30 beers daily. Improving. Previously on precedex & ativan for w/drawal. Transferred to the ICU on 7/21 and intubated and placed on sedation. Self extubated 7/23 and remained on precedex that has since been weaned off. Continue on librium, clonidine, valium. Continue on folic acid, multivitamin, thiamine. Alcohol cessation counseling.  Bipolar disorder: unknown type or severity. Continue on zyprexa, benztropine  Homicidal ideation: had been reporting homicidal ideations at home. Placed on IVC, psychiatry following. Medically stable for transfer to inpatient psych facility and CM is working on this   Dysphagia: continue on dysphagia diet as per speech   Aspiration pneumonia: complete a course of abx. Encourage incentive spirometry   Thrombocytosis: etiology unclear, likely reactive. Will continue to monitor   Macrocytic anemia: secondary to alcohol abuse. H&H are stable. No need for a transfusion currently.    CAD: s/p CABG. Continue on aspirn   Essential hypertension: continue on clonidine   Tobacco use disorder: nicotine patch to prevent w/drawal  BPH: continue on flomax   Generalized weakness: PT has signed off and pt no longer requiring PT.  DVT prophylaxis: lovenox  Code Status: full  Family Communication:  Disposition Plan: likely d/c to inpatient psych facility   Status is: Inpatient  Remains inpatient appropriate because:Unsafe d/c plan.  Waiting to find an inpatient psych facility for the pt to be d/c to   Dispo: The patient is from: Home              Anticipated d/c is to: inpatient psych facility               Anticipated d/c date is: 2 days or how many ever days it takes to transfer the pt to an inpatient psych facility               Patient currently is medically stable to d/c.      Consultants:   Psych     Procedures:    Antimicrobials:    Subjective: Pt c/o malaise  Objective: Vitals:   06/17/20 0753 06/17/20 2018 06/18/20 0456 06/18/20 0703  BP: 113/79 127/80 111/71 119/85  Pulse: 84 76 94 85  Resp: 17 20 18 16   Temp: 98 F (36.7 C) 98.8 F (37.1 C) 98.3 F (36.8 C) 98.1 F (36.7 C)  TempSrc:  Oral Oral Oral  SpO2: 98% 99% 97% 97%  Weight:   65.5 kg   Height:        Intake/Output Summary (Last 24 hours) at 06/18/2020 0739 Last data filed at 06/18/2020 06/20/2020 Gross per 24 hour  Intake --  Output 300 ml  Net -300 ml   Filed Weights   06/13/20 0600 06/17/20 0458 06/18/20 0456  Weight: 66.5 kg 66 kg 65.5 kg    Examination:  General exam: Appears calm and comfortable  Respiratory system: clear breath sounds b/l. No rales, rhonchi  Cardiovascular system: S1 & S2 heard. No  rubs, gallops or murmurs Gastrointestinal system: Abdomen is nondistended, soft and nontender.  Normal bowel  sounds heard. Central nervous system: Alert and awake. Moves all 4 extremities  Psychiatry: Judgement and insight appear inappropriate.  Normal mood and affect     Data Reviewed: I have personally reviewed following labs and imaging studies  CBC: Recent Labs  Lab 06/14/20 0558 06/15/20 0613 06/16/20 0626 06/17/20 0523 06/18/20 0405  WBC 11.3* 11.6* 11.3* 10.0 13.0*  NEUTROABS 7.2 7.5 7.2 5.8 8.4*  HGB 10.6* 11.0* 11.0* 11.0* 11.2*  HCT 30.6* 32.6* 33.0* 33.0* 33.7*  MCV 100.0 101.6* 102.2* 102.2* 102.1*  PLT 402* 436* 470* 509* 543*   Basic Metabolic Panel: Recent Labs  Lab 06/14/20 0558  06/15/20 0613 06/16/20 0626 06/17/20 0523 06/18/20 0405  NA 141 139 139 140 140  K 3.6 3.7 3.6 3.6 3.9  CL 107 106 103 101 105  CO2 30 25 27 27 27   GLUCOSE 94 96 91 88 85  BUN 6 8 12 13 12   CREATININE 0.68 0.54* 0.58* 0.58* 0.48*  CALCIUM 8.6* 9.1 9.2 9.0 9.1  MG 1.8 1.9 1.8 1.8 1.8  PHOS 3.4 4.3 4.3 4.1 3.5   GFR: Estimated Creatinine Clearance: 104.6 mL/min (A) (by C-G formula based on SCr of 0.48 mg/dL (L)). Liver Function Tests: Recent Labs  Lab 06/14/20 0558 06/15/20 06/16/20 06/16/20 0626 06/17/20 0523 06/18/20 0405  ALBUMIN 3.4* 3.4* 3.5 3.4* 3.6   No results for input(s): LIPASE, AMYLASE in the last 168 hours. No results for input(s): AMMONIA in the last 168 hours. Coagulation Profile: No results for input(s): INR, PROTIME in the last 168 hours. Cardiac Enzymes: No results for input(s): CKTOTAL, CKMB, CKMBINDEX, TROPONINI in the last 168 hours. BNP (last 3 results) No results for input(s): PROBNP in the last 8760 hours. HbA1C: No results for input(s): HGBA1C in the last 72 hours. CBG: Recent Labs  Lab 06/16/20 2122 06/17/20 0739 06/17/20 1201 06/17/20 1645 06/17/20 2203  GLUCAP 111* 80 94 103* 92   Lipid Profile: No results for input(s): CHOL, HDL, LDLCALC, TRIG, CHOLHDL, LDLDIRECT in the last 72 hours. Thyroid Function Tests: No results for input(s): TSH, T4TOTAL, FREET4, T3FREE, THYROIDAB in the last 72 hours. Anemia Panel: No results for input(s): VITAMINB12, FOLATE, FERRITIN, TIBC, IRON, RETICCTPCT in the last 72 hours. Sepsis Labs: No results for input(s): PROCALCITON, LATICACIDVEN in the last 168 hours.  No results found for this or any previous visit (from the past 240 hour(s)).       Radiology Studies: No results found.      Scheduled Meds: . aspirin EC  81 mg Oral Daily  . benztropine  0.5 mg Oral BID  . chlordiazePOXIDE  50 mg Oral BID  . cloNIDine  0.1 mg Oral TID  . enoxaparin (LOVENOX) injection  40 mg Subcutaneous Q24H  .  folic acid  1 mg Oral Daily  . haloperidol  2.5 mg Oral BID  . multivitamin with minerals  1 tablet Oral Daily  . nicotine  21 mg Transdermal Daily  . OLANZapine zydis  20 mg Oral QHS  . pneumococcal 23 valent vaccine  0.5 mL Intramuscular Tomorrow-1000  . tamsulosin  0.4 mg Oral QPC supper  . thiamine  100 mg Oral Daily   Or  . thiamine  100 mg Intravenous Daily   Continuous Infusions: . sodium chloride       LOS: 14 days    Time spent: 31 mins     06/19/20, MD [facility] Pager [phone]  If 7PM-7AM, please contact night-coverage [email or web address] 06/18/2020, 7:39 AM

## 2020-06-18 NOTE — Progress Notes (Signed)
Patient had bout with hiccoughs. Gave Thorazine. Will monitor for effectiveness.

## 2020-06-18 NOTE — Progress Notes (Signed)
Physical Therapy Treatment Patient Details Name: Jason Atkins MRN: [id number] DOB: January 26, 1971 Today's Date: 06/18/2020    History of Present Illness Pt is 49 yo male admitted 06/09/20 with alcohol withdrawal, homicidal ideation. PMH: HTN, CAD, CABG, alcohol and tobacco abuse, hepatic encephalopathy.    PT Comments    Pt was in bed long sitting with sitter present. Pt is very eager to get OOB. He required no physical assistance to get out of bed, stand, and ambulate with RW. Pt will benefit from RW at DC. No acute PT needs. Will sign off. Pt is planning to DC to inpatient psych.      Follow Up Recommendations  No PT follow up;Supervision - Intermittent     Equipment Recommendations  Rolling walker with 5" wheels    Recommendations for Other Services       Precautions / Restrictions Precautions Precautions: None Restrictions Weight Bearing Restrictions: No    Mobility  Bed Mobility Overal bed mobility: Independent                Transfers Overall transfer level: Modified independent Equipment used: Rolling walker (2 wheeled) Transfers: Sit to/from Stand Sit to Stand: Modified independent (Device/Increase time)         General transfer comment: no physical assistance required to exit L side of bed.  Ambulation/Gait Ambulation/Gait assistance: Modified independent (Device/Increase time) Gait Distance (Feet): 200 Feet Assistive device: Rolling walker (2 wheeled) Gait Pattern/deviations: Step-through pattern;Narrow base of support;Decreased step length - right;Decreased step length - left;Decreased stride length Gait velocity: decreased   General Gait Details: Pt was able to ambulate 200 ft with RW without LOB or unsteadiness. very slow cadence but safe kinematics   Stairs             Wheelchair Mobility    Modified Rankin (Stroke Patients Only)       Balance Overall balance assessment: Needs assistance Sitting-balance support: No upper extremity  supported;Feet supported Sitting balance-Leahy Scale: Normal Sitting balance - Comments: no LOB or unsteadiness in sitting   Standing balance support: Bilateral upper extremity supported;During functional activity Standing balance-Leahy Scale: Good Standing balance comment: no LOB or unsteadiness                            Cognition Arousal/Alertness: Awake/alert Behavior During Therapy: Flat affect;WFL for tasks assessed/performed Overall Cognitive Status: Within Functional Limits for tasks assessed                                 General Comments: Pt was long sitting in bed with sitter present upon arriving. He agrees to PT session and is very cooperative and eager for OOB activity      Exercises      General Comments        Pertinent Vitals/Pain Pain Assessment: No/denies pain    Home Living                      Prior Function            PT Goals (current goals can now be found in the care plan section) Acute Rehab PT Goals Patient Stated Goal: To get stronger and be able to walk. Progress towards PT goals: Goals met/education completed, patient discharged from PT    Frequency    Min 2X/week      PT Plan Discharge plan needs to  be updated    Co-evaluation              AM-PAC PT "6 Clicks" Mobility   Outcome Measure  Help needed turning from your back to your side while in a flat bed without using bedrails?: None Help needed moving from lying on your back to sitting on the side of a flat bed without using bedrails?: None Help needed moving to and from a bed to a chair (including a wheelchair)?: None Help needed standing up from a chair using your arms (e.g., wheelchair or bedside chair)?: None Help needed to walk in hospital room?: None Help needed climbing 3-5 steps with a railing? : None 6 Click Score: 24    End of Session         PT Visit Diagnosis: Unsteadiness on feet (R26.81);Other abnormalities of gait  and mobility (R26.89);Muscle weakness (generalized) (M62.81);Difficulty in walking, not elsewhere classified (R26.2)     Time: 1040-1051 PT Time Calculation (min) (ACUTE ONLY): 11 min  Charges:  $Gait Training: 8-22 mins                     Julaine Fusi PTA 06/18/20, 12:51 PM

## 2020-06-19 LAB — RENAL FUNCTION PANEL
Albumin: 3.5 g/dL (ref 3.5–5.0)
Anion gap: 12 (ref 5–15)
BUN: 11 mg/dL (ref 6–20)
CO2: 26 mmol/L (ref 22–32)
Calcium: 9.5 mg/dL (ref 8.9–10.3)
Chloride: 102 mmol/L (ref 98–111)
Creatinine, Ser: 0.48 mg/dL — ABNORMAL LOW (ref 0.61–1.24)
GFR calc Af Amer: >60 mL/min (ref >60)
GFR calc non Af Amer: >60 mL/min (ref >60)
Glucose, Bld: 83 mg/dL (ref 70–99)
Phosphorus: 3.9 mg/dL (ref 2.5–4.6)
Potassium: 3.8 mmol/L (ref 3.5–5.1)
Sodium: 140 mmol/L (ref 135–145)

## 2020-06-19 LAB — CBC WITH DIFFERENTIAL/PLATELET
Abs Immature Granulocytes: 0.09 10*3/uL — ABNORMAL HIGH (ref 0.00–0.07)
Basophils Absolute: 0.2 10*3/uL — ABNORMAL HIGH (ref 0.0–0.1)
Basophils Relative: 2 %
Eosinophils Absolute: 0.6 10*3/uL — ABNORMAL HIGH (ref 0.0–0.5)
Eosinophils Relative: 5 %
HCT: 35.1 % — ABNORMAL LOW (ref 39.0–52.0)
Hemoglobin: 11.8 g/dL — ABNORMAL LOW (ref 13.0–17.0)
Immature Granulocytes: 1 %
Lymphocytes Relative: 20 %
Lymphs Abs: 2.3 10*3/uL (ref 0.7–4.0)
MCH: 34.2 pg — ABNORMAL HIGH (ref 26.0–34.0)
MCHC: 33.6 g/dL (ref 30.0–36.0)
MCV: 101.7 fL — ABNORMAL HIGH (ref 80.0–100.0)
Monocytes Absolute: 1.1 10*3/uL — ABNORMAL HIGH (ref 0.1–1.0)
Monocytes Relative: 10 %
Neutro Abs: 7.2 10*3/uL (ref 1.7–7.7)
Neutrophils Relative %: 62 %
Platelets: 575 10*3/uL — ABNORMAL HIGH (ref 150–400)
RBC: 3.45 MIL/uL — ABNORMAL LOW (ref 4.22–5.81)
RDW: 12.5 % (ref 11.5–15.5)
WBC: 11.5 10*3/uL — ABNORMAL HIGH (ref 4.0–10.5)
nRBC: 0 % (ref 0.0–0.2)

## 2020-06-19 LAB — GLUCOSE, CAPILLARY
Glucose-Capillary: 86 mg/dL (ref 70–99)
Glucose-Capillary: 87 mg/dL (ref 70–99)
Glucose-Capillary: 125 mg/dL — ABNORMAL HIGH (ref 70–99)
Glucose-Capillary: 108 mg/dL — ABNORMAL HIGH (ref 70–99)

## 2020-06-19 LAB — MAGNESIUM: Magnesium: 1.8 mg/dL (ref 1.7–2.4)

## 2020-06-19 MED ORDER — DOCUSATE SODIUM 100 MG PO CAPS
200.0000 mg | ORAL_CAPSULE | Freq: Two times a day (BID) | ORAL | Status: DC
  Administered 2020-06-19 – 2020-06-22 (×7): 200 mg via ORAL
  Filled 2020-06-19 (×8): qty 2

## 2020-06-19 MED ORDER — BISACODYL 5 MG PO TBEC
10.0000 mg | DELAYED_RELEASE_TABLET | Freq: Every day | ORAL | Status: DC | PRN
  Administered 2020-06-19 – 2020-06-21 (×3): 10 mg via ORAL
  Filled 2020-06-19 (×4): qty 2

## 2020-06-19 NOTE — Progress Notes (Signed)
PROGRESS NOTE    Jason Atkins  SEG:[id number] DOB: 02-26-1971 DOA: 06/01/2020 PCP: [facility]   Assessment & Plan:   Principal Problem:   Alcohol withdrawal (HCC) Active Problems:   Alcohol abuse   Hypertension   Coronary artery disease   Thrombocytopenia (HCC)   Tobacco abuse   Abnormal LFTs   Protein-calorie malnutrition, severe   Alcohol use disorder, severe, dependence (HCC)   Major depressive disorder, recurrent severe without psychotic features (HCC)  Alcohol withdrawal syndrome: drinks 25-30 beers daily. Improving. Previously on precedex & ativan for w/drawal. Transferred to the ICU on 7/21 and intubated and placed on sedation. Self extubated 7/23 and remained on precedex that has since been weaned off. Continue on benzos, alpha blocker, valium. folic acid, multivitamin, &  thiamine.  Bipolar disorder: unknown type or severity. Continue on zyprexa, benztropine  Homicidal ideation: had been reporting homicidal ideations at home. Placed on IVC, psychiatry following. Medically stable for transfer to inpatient psych facility and CM is working on this   Dysphagia: continue on dysphagia diet as per speech   Aspiration pneumonia: complete a course of abx. Encourage incentive spirometry   Thrombocytosis: etiology unclear, likely reactive. Will continue to monitor   Macrocytic anemia: secondary to alcohol abuse. H&H are stable. Will transfuse if Hb <7  CAD: s/p CABG. Continue on aspirn   Essential hypertension: continue on clonidine   Tobacco use disorder: nicotine patch to prevent w/drawal. Smoking cessation counseling  BPH: continue on tamsulosin  Generalized weakness: PT has signed off as pt no longer requiring PT.  DVT prophylaxis: lovenox  Code Status: full  Family Communication:  Disposition Plan: likely d/c to inpatient psych facility   Status is: Inpatient  Remains inpatient appropriate because:Unsafe d/c plan. Waiting to find an  inpatient psych facility to transfer the pt to   Dispo: The patient is from: Home              Anticipated d/c is to: inpatient psych facility               Anticipated d/c date is: how many ever days it takes to transfer the pt to an inpatient psych facility               Patient currently is medically stable to d/c.      Consultants:   Psych     Procedures:    Antimicrobials:    Subjective: Pt c/o fatigue   Objective: Vitals:   06/19/20 0433 06/19/20 0555 06/19/20 0743 06/19/20 1237  BP: 93/71  122/84 (!) 89/68  Pulse: 77  74 87  Resp: 18  20 20   Temp:   98 F (36.7 C) 98.4 F (36.9 C)  TempSrc:   Oral Oral  SpO2: 98%  99% 99%  Weight:  67.4 kg    Height:        Intake/Output Summary (Last 24 hours) at 06/19/2020 1259 Last data filed at 06/19/2020 1214 Gross per 24 hour  Intake 1320 ml  Output --  Net 1320 ml   Filed Weights   06/17/20 0458 06/18/20 0456 06/19/20 0555  Weight: 66 kg 65.5 kg 67.4 kg    Examination:  General exam: Appears calm and comfortable  Respiratory system: clear breath sounds b/l. No wheezes. Normal RR  Cardiovascular system: S1 & S2 +. No  rubs, gallops Gastrointestinal system: Abdomen is nondistended, soft and nontender. Hypoactive bowel sounds heard. Central nervous system: Alert and awake. Moves all 4 extremities  Psychiatry:  Judgement and insight appear inappropriate.  Flat mood and affect     Data Reviewed: I have personally reviewed following labs and imaging studies  CBC: Recent Labs  Lab 06/15/20 0613 06/16/20 0626 06/17/20 0523 06/18/20 0405 06/19/20 0614  WBC 11.6* 11.3* 10.0 13.0* 11.5*  NEUTROABS 7.5 7.2 5.8 8.4* 7.2  HGB 11.0* 11.0* 11.0* 11.2* 11.8*  HCT 32.6* 33.0* 33.0* 33.7* 35.1*  MCV 101.6* 102.2* 102.2* 102.1* 101.7*  PLT 436* 470* 509* 543* 575*   Basic Metabolic Panel: Recent Labs  Lab 06/15/20 0613 06/16/20 0626 06/17/20 0523 06/18/20 0405 06/19/20 0614  NA 139 139 140 140 140  K  3.7 3.6 3.6 3.9 3.8  CL 106 103 101 105 102  CO2 25 27 27 27 26   GLUCOSE 96 91 88 85 83  BUN 8 12 13 12 11   CREATININE 0.54* 0.58* 0.58* 0.48* 0.48*  CALCIUM 9.1 9.2 9.0 9.1 9.5  MG 1.9 1.8 1.8 1.8 1.8  PHOS 4.3 4.3 4.1 3.5 3.9   GFR: Estimated Creatinine Clearance: 107.8 mL/min (A) (by C-G formula based on SCr of 0.48 mg/dL (L)). Liver Function Tests: Recent Labs  Lab 06/15/20 06/16/20 0626 06/17/20 0523 06/18/20 0405 06/19/20 0614  ALBUMIN 3.4* 3.5 3.4* 3.6 3.5   No results for input(s): LIPASE, AMYLASE in the last 168 hours. No results for input(s): AMMONIA in the last 168 hours. Coagulation Profile: No results for input(s): INR, PROTIME in the last 168 hours. Cardiac Enzymes: No results for input(s): CKTOTAL, CKMB, CKMBINDEX, TROPONINI in the last 168 hours. BNP (last 3 results) No results for input(s): PROBNP in the last 8760 hours. HbA1C: No results for input(s): HGBA1C in the last 72 hours. CBG: Recent Labs  Lab 06/18/20 1113 06/18/20 1633 06/18/20 2126 06/19/20 0741 06/19/20 1148  GLUCAP 124* 106* 107* 86 87   Lipid Profile: No results for input(s): CHOL, HDL, LDLCALC, TRIG, CHOLHDL, LDLDIRECT in the last 72 hours. Thyroid Function Tests: No results for input(s): TSH, T4TOTAL, FREET4, T3FREE, THYROIDAB in the last 72 hours. Anemia Panel: No results for input(s): VITAMINB12, FOLATE, FERRITIN, TIBC, IRON, RETICCTPCT in the last 72 hours. Sepsis Labs: No results for input(s): PROCALCITON, LATICACIDVEN in the last 168 hours.  No results found for this or any previous visit (from the past 240 hour(s)).       Radiology Studies: No results found.      Scheduled Meds: . aspirin EC  81 mg Oral Daily  . benztropine  0.5 mg Oral BID  . chlordiazePOXIDE  50 mg Oral BID  . cloNIDine  0.1 mg Oral TID  . docusate sodium  200 mg Oral BID  . enoxaparin (LOVENOX) injection  40 mg Subcutaneous Q24H  . folic acid  1 mg Oral Daily  . haloperidol  2.5 mg  Oral BID  . multivitamin with minerals  1 tablet Oral Daily  . nicotine  21 mg Transdermal Daily  . OLANZapine zydis  20 mg Oral QHS  . pneumococcal 23 valent vaccine  0.5 mL Intramuscular Tomorrow-1000  . tamsulosin  0.4 mg Oral QPC supper  . thiamine  100 mg Oral Daily   Or  . thiamine  100 mg Intravenous Daily   Continuous Infusions: . sodium chloride       LOS: 15 days    Time spent: 30 mins     06/21/20, MD [facility] Pager [phone]  If 7PM-7AM, please contact night-coverage [email or web address] 06/19/2020, 12:59 PM

## 2020-06-19 NOTE — Progress Notes (Signed)
I was just notified by the RN that documentation is for every 15 minutes.

## 2020-06-19 NOTE — Progress Notes (Signed)
Pt's BP around 1300 was 86/68, MAP - 75. P - 85. MD notified. No new order received.

## 2020-06-20 LAB — RENAL FUNCTION PANEL
Albumin: 3.6 g/dL (ref 3.5–5.0)
Anion gap: 10 (ref 5–15)
BUN: 15 mg/dL (ref 6–20)
CO2: 27 mmol/L (ref 22–32)
Calcium: 9.8 mg/dL (ref 8.9–10.3)
Chloride: 104 mmol/L (ref 98–111)
Creatinine, Ser: 0.56 mg/dL — ABNORMAL LOW (ref 0.61–1.24)
GFR calc Af Amer: >60 mL/min (ref >60)
GFR calc non Af Amer: >60 mL/min (ref >60)
Glucose, Bld: 89 mg/dL (ref 70–99)
Phosphorus: 4.5 mg/dL (ref 2.5–4.6)
Potassium: 4.1 mmol/L (ref 3.5–5.1)
Sodium: 141 mmol/L (ref 135–145)

## 2020-06-20 LAB — CBC WITH DIFFERENTIAL/PLATELET
Abs Immature Granulocytes: 0.13 10*3/uL — ABNORMAL HIGH (ref 0.00–0.07)
Basophils Absolute: 0.3 10*3/uL — ABNORMAL HIGH (ref 0.0–0.1)
Basophils Relative: 2 %
Eosinophils Absolute: 0.6 10*3/uL — ABNORMAL HIGH (ref 0.0–0.5)
Eosinophils Relative: 4 %
HCT: 34 % — ABNORMAL LOW (ref 39.0–52.0)
Hemoglobin: 11.2 g/dL — ABNORMAL LOW (ref 13.0–17.0)
Immature Granulocytes: 1 %
Lymphocytes Relative: 20 %
Lymphs Abs: 2.7 10*3/uL (ref 0.7–4.0)
MCH: 33.6 pg (ref 26.0–34.0)
MCHC: 32.9 g/dL (ref 30.0–36.0)
MCV: 102.1 fL — ABNORMAL HIGH (ref 80.0–100.0)
Monocytes Absolute: 1.3 10*3/uL — ABNORMAL HIGH (ref 0.1–1.0)
Monocytes Relative: 10 %
Neutro Abs: 8.5 10*3/uL — ABNORMAL HIGH (ref 1.7–7.7)
Neutrophils Relative %: 63 %
Platelets: 559 10*3/uL — ABNORMAL HIGH (ref 150–400)
RBC: 3.33 MIL/uL — ABNORMAL LOW (ref 4.22–5.81)
RDW: 12.5 % (ref 11.5–15.5)
WBC: 13.4 10*3/uL — ABNORMAL HIGH (ref 4.0–10.5)
nRBC: 0 % (ref 0.0–0.2)

## 2020-06-20 LAB — GLUCOSE, CAPILLARY
Glucose-Capillary: 77 mg/dL (ref 70–99)
Glucose-Capillary: 91 mg/dL (ref 70–99)
Glucose-Capillary: 91 mg/dL (ref 70–99)
Glucose-Capillary: 99 mg/dL (ref 70–99)

## 2020-06-20 LAB — MAGNESIUM: Magnesium: 1.8 mg/dL (ref 1.7–2.4)

## 2020-06-20 NOTE — Progress Notes (Signed)
PROGRESS NOTE    Jason Atkins  DPO:[id number] DOB: 1971/10/09 DOA: 06/01/2020 PCP: [facility]   Assessment & Plan:   Principal Problem:   Alcohol withdrawal (HCC) Active Problems:   Alcohol abuse   Hypertension   Coronary artery disease   Thrombocytopenia (HCC)   Tobacco abuse   Abnormal LFTs   Protein-calorie malnutrition, severe   Alcohol use disorder, severe, dependence (HCC)   Major depressive disorder, recurrent severe without psychotic features (HCC)  Alcohol withdrawal syndrome: drinks 25-30 beers daily. Improving. Previously on precedex & ativan for w/drawal. Transferred to the ICU on 7/21 and intubated and placed on sedation. Self extubated 7/23 and remained on precedex that has since been weaned off. Continue on benzos, clonidine, valium. folic acid, multivitamin, &  thiamine.  Bipolar disorder: unknown type or severity. Continue on zyprexa, benztropine  Homicidal ideation: had been reporting homicidal ideations at home. Placed on IVC, psychiatry following. Medically stable for transfer to inpatient psych facility and CM is working on this   Dysphagia: continue on dysphagia diet as per speech   Aspiration pneumonia: complete a course of abx. Encourage incentive spirometry   Thrombocytosis: etiology unclear, likely reactive. Will continue to monitor   Macrocytic anemia: secondary to alcohol abuse. H&H are stable. Will transfuse if Hb <7  CAD: s/p CABG. Continue on aspirn   Essential hypertension: continue on clonidine   Tobacco use disorder: nicotine patch to prevent w/drawal. Smoking cessation counseling  BPH: continue on flomax  Generalized weakness: PT is no longer following the pt as they have signed off   DVT prophylaxis: lovenox  Code Status: full  Family Communication:  Disposition Plan: likely d/c to inpatient psych facility   Status is: Inpatient  Remains inpatient appropriate because:Unsafe d/c plan. Waiting to find an  inpatient psych facility to transfer the pt to   Dispo: The patient is from: Home              Anticipated d/c is to: inpatient psych facility               Anticipated d/c date is: how many ever days it takes to transfer the pt to an inpatient psych facility               Patient currently is medically stable to d/c.      Consultants:   Psych     Procedures:    Antimicrobials:    Subjective: Pt c/o malaise  Objective: Vitals:   06/19/20 1533 06/19/20 1948 06/19/20 2338 06/20/20 0350  BP: 103/76 107/77 123/85 108/73  Pulse: 78 84 78 72  Resp: 18 18 18 17   Temp: 98.3 F (36.8 C) 98.4 F (36.9 C) 97.8 F (36.6 C)   TempSrc: Oral Oral Oral   SpO2: 100% 100% 97% 98%  Weight:      Height:        Intake/Output Summary (Last 24 hours) at 06/20/2020 0722 Last data filed at 06/19/2020 1214 Gross per 24 hour  Intake 1080 ml  Output --  Net 1080 ml   Filed Weights   06/17/20 0458 06/18/20 0456 06/19/20 0555  Weight: 66 kg 65.5 kg 67.4 kg    Examination:  General exam: Appears calm and comfortable  Respiratory system: decreased breath sounds b/l. No rales Cardiovascular system: S1 & S2 +. No  rubs, gallops. Gastrointestinal system: Abdomen is nondistended, soft and nontender.  Hypoactive bowel sounds heard. Central nervous system: Alert and awake. Moves all 4 extremities  Psychiatry:  Judgement and insight appear inappropriate.  Flat mood and affect     Data Reviewed: I have personally reviewed following labs and imaging studies  CBC: Recent Labs  Lab 06/16/20 0626 06/17/20 0523 06/18/20 0405 06/19/20 0614 06/20/20 0546  WBC 11.3* 10.0 13.0* 11.5* 13.4*  NEUTROABS 7.2 5.8 8.4* 7.2 8.5*  HGB 11.0* 11.0* 11.2* 11.8* 11.2*  HCT 33.0* 33.0* 33.7* 35.1* 34.0*  MCV 102.2* 102.2* 102.1* 101.7* 102.1*  PLT 470* 509* 543* 575* 559*   Basic Metabolic Panel: Recent Labs  Lab 06/16/20 0626 06/17/20 0523 06/18/20 0405 06/19/20 0614 06/20/20 0546  NA 139  140 140 140 141  K 3.6 3.6 3.9 3.8 4.1  CL 103 101 105 102 104  CO2 27 27 27 26 27   GLUCOSE 91 88 85 83 89  BUN 12 13 12 11 15   CREATININE 0.58* 0.58* 0.48* 0.48* 0.56*  CALCIUM 9.2 9.0 9.1 9.5 9.8  MG 1.8 1.8 1.8 1.8 1.8  PHOS 4.3 4.1 3.5 3.9 4.5   GFR: Estimated Creatinine Clearance: 107.8 mL/min (A) (by C-G formula based on SCr of 0.56 mg/dL (L)). Liver Function Tests: Recent Labs  Lab 06/16/20 0626 06/17/20 0523 06/18/20 0405 06/19/20 0614 06/20/20 0546  ALBUMIN 3.5 3.4* 3.6 3.5 3.6   No results for input(s): LIPASE, AMYLASE in the last 168 hours. No results for input(s): AMMONIA in the last 168 hours. Coagulation Profile: No results for input(s): INR, PROTIME in the last 168 hours. Cardiac Enzymes: No results for input(s): CKTOTAL, CKMB, CKMBINDEX, TROPONINI in the last 168 hours. BNP (last 3 results) No results for input(s): PROBNP in the last 8760 hours. HbA1C: No results for input(s): HGBA1C in the last 72 hours. CBG: Recent Labs  Lab 06/18/20 2126 06/19/20 0741 06/19/20 1148 06/19/20 1719 06/19/20 2111  GLUCAP 107* 86 87 125* 108*   Lipid Profile: No results for input(s): CHOL, HDL, LDLCALC, TRIG, CHOLHDL, LDLDIRECT in the last 72 hours. Thyroid Function Tests: No results for input(s): TSH, T4TOTAL, FREET4, T3FREE, THYROIDAB in the last 72 hours. Anemia Panel: No results for input(s): VITAMINB12, FOLATE, FERRITIN, TIBC, IRON, RETICCTPCT in the last 72 hours. Sepsis Labs: No results for input(s): PROCALCITON, LATICACIDVEN in the last 168 hours.  No results found for this or any previous visit (from the past 240 hour(s)).       Radiology Studies: No results found.      Scheduled Meds: . aspirin EC  81 mg Oral Daily  . benztropine  0.5 mg Oral BID  . chlordiazePOXIDE  50 mg Oral BID  . cloNIDine  0.1 mg Oral TID  . docusate sodium  200 mg Oral BID  . enoxaparin (LOVENOX) injection  40 mg Subcutaneous Q24H  . folic acid  1 mg Oral Daily  .  haloperidol  2.5 mg Oral BID  . multivitamin with minerals  1 tablet Oral Daily  . nicotine  21 mg Transdermal Daily  . OLANZapine zydis  20 mg Oral QHS  . pneumococcal 23 valent vaccine  0.5 mL Intramuscular Tomorrow-1000  . tamsulosin  0.4 mg Oral QPC supper  . thiamine  100 mg Oral Daily   Or  . thiamine  100 mg Intravenous Daily   Continuous Infusions: . sodium chloride       LOS: 16 days    Time spent: 33 mins     06/21/20, MD [facility] Pager [phone]  If 7PM-7AM, please contact night-coverage [email or web address] 06/20/2020, 7:22 AM

## 2020-06-21 LAB — GLUCOSE, CAPILLARY
Glucose-Capillary: 87 mg/dL (ref 70–99)
Glucose-Capillary: 91 mg/dL (ref 70–99)
Glucose-Capillary: 98 mg/dL (ref 70–99)
Glucose-Capillary: 107 mg/dL — ABNORMAL HIGH (ref 70–99)

## 2020-06-21 LAB — RENAL FUNCTION PANEL
Albumin: 3.5 g/dL (ref 3.5–5.0)
Anion gap: 11 (ref 5–15)
BUN: 16 mg/dL (ref 6–20)
CO2: 27 mmol/L (ref 22–32)
Calcium: 9.6 mg/dL (ref 8.9–10.3)
Chloride: 103 mmol/L (ref 98–111)
Creatinine, Ser: 0.67 mg/dL (ref 0.61–1.24)
GFR calc Af Amer: >60 mL/min (ref >60)
GFR calc non Af Amer: >60 mL/min (ref >60)
Glucose, Bld: 106 mg/dL — ABNORMAL HIGH (ref 70–99)
Phosphorus: 4.2 mg/dL (ref 2.5–4.6)
Potassium: 3.9 mmol/L (ref 3.5–5.1)
Sodium: 141 mmol/L (ref 135–145)

## 2020-06-21 LAB — CBC WITH DIFFERENTIAL/PLATELET
Abs Immature Granulocytes: 0.08 10*3/uL — ABNORMAL HIGH (ref 0.00–0.07)
Basophils Absolute: 0.2 10*3/uL — ABNORMAL HIGH (ref 0.0–0.1)
Basophils Relative: 2 %
Eosinophils Absolute: 0.6 10*3/uL — ABNORMAL HIGH (ref 0.0–0.5)
Eosinophils Relative: 5 %
HCT: 34.1 % — ABNORMAL LOW (ref 39.0–52.0)
Hemoglobin: 11.8 g/dL — ABNORMAL LOW (ref 13.0–17.0)
Immature Granulocytes: 1 %
Lymphocytes Relative: 19 %
Lymphs Abs: 2.3 10*3/uL (ref 0.7–4.0)
MCH: 34.3 pg — ABNORMAL HIGH (ref 26.0–34.0)
MCHC: 34.6 g/dL (ref 30.0–36.0)
MCV: 99.1 fL (ref 80.0–100.0)
Monocytes Absolute: 1.1 10*3/uL — ABNORMAL HIGH (ref 0.1–1.0)
Monocytes Relative: 9 %
Neutro Abs: 7.7 10*3/uL (ref 1.7–7.7)
Neutrophils Relative %: 64 %
Platelets: 565 10*3/uL — ABNORMAL HIGH (ref 150–400)
RBC: 3.44 MIL/uL — ABNORMAL LOW (ref 4.22–5.81)
RDW: 12.8 % (ref 11.5–15.5)
WBC: 12.1 10*3/uL — ABNORMAL HIGH (ref 4.0–10.5)
nRBC: 0 % (ref 0.0–0.2)

## 2020-06-21 LAB — MAGNESIUM: Magnesium: 1.9 mg/dL (ref 1.7–2.4)

## 2020-06-21 NOTE — Progress Notes (Signed)
Visited Pt while rounding unit. This is a follow-up from previous visits. Pt said he was doing a lot better. He also said he was going home tomorrow. I told him I was glad to see that he was doing better and if he need me before he leaves don't hesitate to call. Ch will follow-up.

## 2020-06-21 NOTE — Progress Notes (Signed)
PROGRESS NOTE    Jason Atkins  NKN:[id number] DOB: 11/23/1970 DOA: 06/01/2020 PCP: [facility]   Assessment & Plan:   Principal Problem:   Alcohol withdrawal (HCC) Active Problems:   Alcohol abuse   Hypertension   Coronary artery disease   Thrombocytopenia (HCC)   Tobacco abuse   Abnormal LFTs   Protein-calorie malnutrition, severe   Alcohol use disorder, severe, dependence (HCC)   Major depressive disorder, recurrent severe without psychotic features (HCC)  Alcohol withdrawal syndrome: drinks 25-30 beers daily. Improving. Previously on precedex & ativan for w/drawal. Transferred to the ICU on 7/21 and intubated and placed on sedation. Self extubated 7/23 and remained on precedex that has since been weaned off. Continue on librium, clonidine, valium. folic acid, multivitamin, &  thiamine.  Bipolar disorder: unknown type or severity. Continue on zyprexa, benztropine  Homicidal ideation: had been reporting homicidal ideations at home. Placed on IVC, psychiatry following. Medically stable for transfer to inpatient psych facility still, unable to find placement so far  Dysphagia: continue on dysphagia diet as per speech   Aspiration pneumonia: complete a course of abx. Encourage incentive spirometry   Thrombocytosis: etiology unclear, likely reactive. Labile   Macrocytic anemia: secondary to alcohol abuse. H&H are stable. No need for a transfusion at this time   CAD: s/p CABG. Continue on aspirn   Essential hypertension: continue on clonidine   Tobacco use disorder: nicotine patch to prevent w/drawal. Smoking cessation counseling  BPH: continue on tamsulosin   Generalized weakness: PT is no longer following the pt as they have signed off   DVT prophylaxis: lovenox  Code Status: full  Family Communication:  Disposition Plan: likely d/c to inpatient psych facility   Status is: Inpatient  Remains inpatient appropriate because:Unsafe d/c plan.  Waiting to find an inpatient psych facility to transfer the pt to   Dispo: The patient is from: Home              Anticipated d/c is to: inpatient psych facility               Anticipated d/c date is: how many ever days it takes to transfer the pt to an inpatient psych facility               Patient currently is medically stable to d/c.      Consultants:   Psych     Procedures:    Antimicrobials:    Subjective: Pt c/o constipation  Objective: Vitals:   06/20/20 2217 06/21/20 0211 06/21/20 0500 06/21/20 0623  BP: 108/77 111/73  106/74  Pulse: 83 81  84  Resp: 17 17  18   Temp: 97.7 F (36.5 C) 97.9 F (36.6 C)  97.6 F (36.4 C)  TempSrc: Oral Oral  Oral  SpO2: 99% 100%  100%  Weight:   68.1 kg   Height:        Intake/Output Summary (Last 24 hours) at 06/21/2020 0734 Last data filed at 06/20/2020 2340 Gross per 24 hour  Intake 1330 ml  Output --  Net 1330 ml   Filed Weights   06/18/20 0456 06/19/20 0555 06/21/20 0500  Weight: 65.5 kg 67.4 kg 68.1 kg    Examination:  General exam: Appears calm and comfortable  Respiratory system: course breath sounds b/l. No wheezes Cardiovascular system: S1 & S2 +. No  rubs, gallops or clicks. Gastrointestinal system: Abdomen is nondistended, soft and nontender.  Normal bowel sounds heard. Central nervous system: Alert and awake. Moves  all 4 extremities  Psychiatry: Judgement and insight appear abnormal.  Flat mood and affect    Data Reviewed: I have personally reviewed following labs and imaging studies  CBC: Recent Labs  Lab 06/17/20 0523 06/18/20 0405 06/19/20 0614 06/20/20 0546 06/21/20 0415  WBC 10.0 13.0* 11.5* 13.4* 12.1*  NEUTROABS 5.8 8.4* 7.2 8.5* 7.7  HGB 11.0* 11.2* 11.8* 11.2* 11.8*  HCT 33.0* 33.7* 35.1* 34.0* 34.1*  MCV 102.2* 102.1* 101.7* 102.1* 99.1  PLT 509* 543* 575* 559* 565*   Basic Metabolic Panel: Recent Labs  Lab 06/17/20 0523 06/18/20 0405 06/19/20 0614 06/20/20 0546  06/21/20 0415  NA 140 140 140 141 141  K 3.6 3.9 3.8 4.1 3.9  CL 101 105 102 104 103  CO2 27 27 26 27 27   GLUCOSE 88 85 83 89 106*  BUN 13 12 11 15 16   CREATININE 0.58* 0.48* 0.48* 0.56* 0.67  CALCIUM 9.0 9.1 9.5 9.8 9.6  MG 1.8 1.8 1.8 1.8 1.9  PHOS 4.1 3.5 3.9 4.5 4.2   GFR: Estimated Creatinine Clearance: 108.8 mL/min (by C-G formula based on SCr of 0.67 mg/dL). Liver Function Tests: Recent Labs  Lab 06/17/20 0523 06/18/20 0405 06/19/20 0614 06/20/20 0546 06/21/20 0415  ALBUMIN 3.4* 3.6 3.5 3.6 3.5   No results for input(s): LIPASE, AMYLASE in the last 168 hours. No results for input(s): AMMONIA in the last 168 hours. Coagulation Profile: No results for input(s): INR, PROTIME in the last 168 hours. Cardiac Enzymes: No results for input(s): CKTOTAL, CKMB, CKMBINDEX, TROPONINI in the last 168 hours. BNP (last 3 results) No results for input(s): PROBNP in the last 8760 hours. HbA1C: No results for input(s): HGBA1C in the last 72 hours. CBG: Recent Labs  Lab 06/19/20 2111 06/20/20 0755 06/20/20 1138 06/20/20 1628 06/20/20 2214  GLUCAP 108* 77 91 91 99   Lipid Profile: No results for input(s): CHOL, HDL, LDLCALC, TRIG, CHOLHDL, LDLDIRECT in the last 72 hours. Thyroid Function Tests: No results for input(s): TSH, T4TOTAL, FREET4, T3FREE, THYROIDAB in the last 72 hours. Anemia Panel: No results for input(s): VITAMINB12, FOLATE, FERRITIN, TIBC, IRON, RETICCTPCT in the last 72 hours. Sepsis Labs: No results for input(s): PROCALCITON, LATICACIDVEN in the last 168 hours.  No results found for this or any previous visit (from the past 240 hour(s)).       Radiology Studies: No results found.      Scheduled Meds: . aspirin EC  81 mg Oral Daily  . benztropine  0.5 mg Oral BID  . chlordiazePOXIDE  50 mg Oral BID  . cloNIDine  0.1 mg Oral TID  . docusate sodium  200 mg Oral BID  . enoxaparin (LOVENOX) injection  40 mg Subcutaneous Q24H  . folic acid  1 mg  Oral Daily  . haloperidol  2.5 mg Oral BID  . multivitamin with minerals  1 tablet Oral Daily  . nicotine  21 mg Transdermal Daily  . OLANZapine zydis  20 mg Oral QHS  . pneumococcal 23 valent vaccine  0.5 mL Intramuscular Tomorrow-1000  . tamsulosin  0.4 mg Oral QPC supper  . thiamine  100 mg Oral Daily   Or  . thiamine  100 mg Intravenous Daily   Continuous Infusions: . sodium chloride       LOS: 17 days    Time spent: 30 mins     08/20/20, MD [facility] Pager [phone]  If 7PM-7AM, please contact night-coverage [email or web address] 06/21/2020, 7:34 AM

## 2020-06-22 LAB — CBC
HCT: 35.5 % — ABNORMAL LOW (ref 39.0–52.0)
Hemoglobin: 11.8 g/dL — ABNORMAL LOW (ref 13.0–17.0)
MCH: 33.9 pg (ref 26.0–34.0)
MCHC: 33.2 g/dL (ref 30.0–36.0)
MCV: 102 fL — ABNORMAL HIGH (ref 80.0–100.0)
Platelets: 513 10*3/uL — ABNORMAL HIGH (ref 150–400)
RBC: 3.48 MIL/uL — ABNORMAL LOW (ref 4.22–5.81)
RDW: 12.7 % (ref 11.5–15.5)
WBC: 12 10*3/uL — ABNORMAL HIGH (ref 4.0–10.5)
nRBC: 0 % (ref 0.0–0.2)

## 2020-06-22 LAB — BASIC METABOLIC PANEL
Anion gap: 7 (ref 5–15)
BUN: 18 mg/dL (ref 6–20)
CO2: 28 mmol/L (ref 22–32)
Calcium: 9.3 mg/dL (ref 8.9–10.3)
Chloride: 104 mmol/L (ref 98–111)
Creatinine, Ser: 0.55 mg/dL — ABNORMAL LOW (ref 0.61–1.24)
GFR calc Af Amer: >60 mL/min (ref >60)
GFR calc non Af Amer: >60 mL/min (ref >60)
Glucose, Bld: 91 mg/dL (ref 70–99)
Potassium: 3.9 mmol/L (ref 3.5–5.1)
Sodium: 139 mmol/L (ref 135–145)

## 2020-06-22 LAB — GLUCOSE, CAPILLARY
Glucose-Capillary: 78 mg/dL (ref 70–99)
Glucose-Capillary: 78 mg/dL (ref 70–99)
Glucose-Capillary: 94 mg/dL (ref 70–99)

## 2020-06-22 LAB — MAGNESIUM: Magnesium: 2 mg/dL (ref 1.7–2.4)

## 2020-06-22 MED ORDER — OLANZAPINE 20 MG PO TBDP: 20.0000 mg | ORAL_TABLET | Freq: Every day | ORAL | 0 refills | Status: AC

## 2020-06-22 MED ORDER — BENZTROPINE MESYLATE 0.5 MG PO TABS: 0.5000 mg | ORAL_TABLET | Freq: Two times a day (BID) | ORAL | 0 refills | Status: DC

## 2020-06-22 MED ORDER — TAMSULOSIN HCL 0.4 MG PO CAPS: 0.4000 mg | ORAL_CAPSULE | Freq: Every day | ORAL | 0 refills | Status: AC

## 2020-06-22 MED ORDER — CHLORDIAZEPOXIDE HCL 25 MG PO CAPS: 50.0000 mg | ORAL_CAPSULE | Freq: Two times a day (BID) | ORAL | 0 refills | Status: AC

## 2020-06-22 NOTE — Plan of Care (Signed)
°  Problem: Coping: Goal: Level of anxiety will decrease 06/22/2020 1715 by Jean Rosenthal, RN Outcome: Adequate for Discharge 06/22/2020 1702 by Jean Rosenthal, RN Outcome: Progressing

## 2020-06-22 NOTE — Progress Notes (Signed)
Nutrition Follow-up ° °DOCUMENTATION CODES:  ° °Severe malnutrition in context of social or environmental circumstances ° °INTERVENTION:  °Continue Hormel Shake po TID with meals, each supplement provides 520 kcal and 20 grams of protein. ° °NUTRITION DIAGNOSIS:  ° °Severe Malnutrition related to social / environmental circumstances (EtOH abuse, inadequate oral intake) as evidenced by moderate fat depletion, severe muscle depletion, percent weight loss. ° °Ongoing. ° °GOAL:  ° °Patient will meet greater than or equal to 90% of their needs ° °Progressing. ° °MONITOR:  ° °Vent status, Labs, Weight trends, TF tolerance, I & O's ° °REASON FOR ASSESSMENT:  ° °Ventilator, Consult °Enteral/tube feeding initiation and management ° °ASSESSMENT:  ° °49 year old male with PMHx of CAD s/p CABG, HTN, EtOH abuse, bipolar disorder admitted with severe alcohol withdrawal with homicidal ideations and required intubation on 7/21. ° °Met with patient at bedside. He reports his appetite is improving now. He is eating most of his meals. According to available meal documentation patient is eating 50-100% of his meals now with the average being 79%. Patient had finished about 90% of his breakfast tray at bedside. Patient reports he is drinking 2-3 bottles of Hormel Shake daily and enjoys them. No meal documentation completed yesterday so unable to calculate intake in the past 24 hours. RD suspects patient is meeting calorie/protein needs from increased intake of meals and oral nutrition supplements. ° °Medications reviewed and include: Colace 200 mg BID, folic acid 1 mg daily, MVI daily, nicotine patch, thiamine 100 mg daily. ° °Labs reviewed: CBG 78-107, Creatinine 0.55. ° °I/O: 8 occurrences unmeasured UOP yesterday; 4 BMs yesterday ° °Weight trend: 66.5 kg on 8/3; pt remaining weight stable from 7/15 ° °Diet Order:   °Diet Order   °       °  DIET DYS 3 Room service appropriate? Yes with Assist; Fluid consistency: Nectar Thick  Diet  effective now       °  °  °  °  ° °EDUCATION NEEDS:  ° °No education needs have been identified at this time ° °Skin:  Skin Assessment: Reviewed RN Assessment ° °Last BM:  06/21/2020 - large type 3 ° °Height:  ° °Ht Readings from Last 1 Encounters:  °06/09/20 5' 11" (1.803 m)  ° °Weight:  ° °Wt Readings from Last 1 Encounters:  °06/22/20 66.5 kg  ° °Ideal Body Weight:  78.2 kg ° °BMI:  Body mass index is 20.43 kg/m². ° °Estimated Nutritional Needs:  ° °Kcal:  2000-2200 ° °Protein:  100-115 grams ° °Fluid:  2-2.3 L/day ° ° King, MS, RD, LDN °Pager number available on Amion °

## 2020-06-22 NOTE — Discharge Summary (Signed)
Physician Discharge Summary  Suszanne Finchddie Corl NGE:[id number] DOB: Nov 02, 1971 DOA: 06/01/2020  PCP: [facility]  Admit date: 06/01/2020 Discharge date: 06/22/2020  Admitted From: home Disposition: home  Recommendations for Outpatient Follow-up:  1. Follow up with PCP in 1 week, will need a referral for speech therapy for repeat MBSS  Home Health: no Equipment/Devices:  Discharge Condition: stable CODE STATUS: full Diet recommendation:Dysphagia: nectar thick liquids  Brief/Interim Summary: HPI was taken from Dr. Clyde LundborgNiu: Donley RedderEddie Sneadis a 49 y.o.malewith medical history significant ofhypertension, CAD, CABG, alcohol abuse, tobacco abuse, hepatic encephalopathy, who presents with alcohol withdrawal, homicidal ideations.  Per his wife,patient has been heavily drinking, approximately 25-30 beers each time.He has decreased oral intake recently.He has been intermittently confused. He fell at Sunday morning,no loss of consciousness. Not sure if he injured his head. No unilateral numbness or tingling inhis extremities. No facial droop or slurred speech. Patient has intermittent diarrhea in the past, but no diarrhea in the past several days. Patient denies nausea,vomiting, abdominal pain. Patient denies chest pain, shortness of breath, cough. No fever or chills. Per EDP, pt reportedly hadhomicidal ideation,but the patient denies suicidalorhomicidal ideations now.  ED Course:pt was found to have WBC 4.4,positive UDS for benzo, negative Covid PCR, alcohol level 229, electrolytes renal function okay, abnormal liver function (ALP 75, AST 120, ALT 50, total bilirubin 1.0), thrombocytopenia with platelets of 115, temperature normal, blood pressure 162/101, tachycardia, tachypnea, oxygen saturation 98% on room air. Patient is placed on med surge bed for observation. Psychiatry, Dr.Raois consulted. Pt is IVC'ed.   Hospital Course from Dr. Wilfred LacyJ. Kippy Melena 7/28-06/22/20: By the  time I saw the pt, the pt had been weaned off of CIWA protocol as well as precedex drip. Pt was awaiting placement at an inpatient psych facility. Another psych physician saw the pt on 06/22/20 and rescinded the IVC and told the pt stop drinking. Pt evidently at that time denied any suicidal or homicidal ideations. Furthermore, speech therapy saw the pt throughout his stay and recommended dysphagia diet w/ nectar thick liquids even at d/c. Pt should f/u w/ speech in 4 weeks for repeat MBSS. Also, pt should see a psychiatrist a soon as possible outpatient. Please see previous progress notes for more information.   Discharge Diagnoses:  Principal Problem:   Alcohol withdrawal (HCC) Active Problems:   Alcohol abuse   Hypertension   Coronary artery disease   Thrombocytopenia (HCC)   Tobacco abuse   Abnormal LFTs   Protein-calorie malnutrition, severe   Alcohol use disorder, severe, dependence (HCC)   Major depressive disorder, recurrent severe without psychotic features (HCC)  Alcohol withdrawal syndrome: drinks 25-30 beers daily. Continues to improve each day. Previously on precedex & ativan for w/drawal. Transferred to the ICU on 7/21 and intubated and placed on sedation. Self extubated 7/23 and remained on precedex that has since been weaned off. Continue on librium, clonidine, valium. folic acid, multivitamin, &  thiamine.  Bipolar disorder: unknown type or severity. Continue on zyprexa, benztropine  Homicidal ideation: had been reporting homicidal ideations at home. Placed on IVC, psychiatry following. Medically stable for transfer to inpatient psych facility still, unable to find placement so far  Dysphagia: continue on dysphagia diet 3 as per speech   Aspiration pneumonia: complete a course of abx. Encourage incentive spirometry   Thrombocytosis: etiology unclear, likely reactive. Labile   Macrocytic anemia: secondary to alcohol abuse. H&H are stable. No need for a transfusion at  this time   CAD: s/p CABG. Continue on  aspirn   Essential hypertension: continue on clonidine   Tobacco use disorder: nicotine patch to prevent w/drawal. Smoking cessation counseling  PZW:CHENIDPO on tamsulosin   Generalized weakness: PT is no longer following the pt as they have signed off    Discharge Instructions  Discharge Instructions    Diet general   Complete by: As directed    Dysphagia diet w/ nectar liquids. Prohibited food (jello, ice cream, thin soups). Will need to speech therapy in 4 weeks for repeat MBSS   Discharge instructions   Complete by: As directed    F/u w/ PCP in 1 week, will need a referral to speech therapy for repeat MBSS. F/u w/ psychiatry as soon as possible   Increase activity slowly   Complete by: As directed      Allergies as of 06/22/2020      Reactions   Almond Oil Other (See Comments)   Reaction: unknown   Cashew Nut Oil Other (See Comments)   Reaction: unknown   Benadryl [diphenhydramine] Other (See Comments)   Reaction: unknown   Ibuprofen Hives      Medication List    TAKE these medications   aspirin EC 81 MG tablet Take 81 mg by mouth daily. Swallow whole.   benztropine 0.5 MG tablet Commonly known as: COGENTIN Take 1 tablet (0.5 mg total) by mouth 2 (two) times daily.   chlordiazePOXIDE 25 MG capsule Commonly known as: LIBRIUM Take 2 capsules (50 mg total) by mouth 2 (two) times daily for 7 days.   hydrochlorothiazide 25 MG tablet Commonly known as: HYDRODIURIL Take 25 mg by mouth daily.   multivitamin with minerals Tabs tablet Take 1 tablet by mouth daily.   OLANZapine zydis 20 MG disintegrating tablet Commonly known as: ZYPREXA Take 1 tablet (20 mg total) by mouth at bedtime.   tamsulosin 0.4 MG Caps capsule Commonly known as: FLOMAX Take 1 capsule (0.4 mg total) by mouth daily after supper.       Allergies  Allergen Reactions   Almond Oil Other (See Comments)    Reaction: unknown   Cashew Nut Oil  Other (See Comments)    Reaction: unknown   Benadryl [Diphenhydramine] Other (See Comments)    Reaction: unknown   Ibuprofen Hives    Consultations:  Psych  ICU    Procedures/Studies: CT HEAD WO CONTRAST  Result Date: 06/03/2020 CLINICAL DATA:  Headache, head trauma, alcohol withdrawal EXAM: CT HEAD WITHOUT CONTRAST TECHNIQUE: Contiguous axial images were obtained from the base of the skull through the vertex without intravenous contrast. COMPARISON:  None. FINDINGS: Brain: Normal anatomic configuration. There is moderate parenchymal volume loss, more than would be typically expected for a patient of this age, particularly involving the cerebellum. No abnormal intra or extra-axial mass lesion or fluid collection. No abnormal mass effect or midline shift. No evidence of acute intracranial hemorrhage or infarct. Ventricular size is normal. Cerebellum unremarkable. Vascular: There is advanced vascular calcifications involving the carotid siphons and terminal vertebral arteries, more severe than would be typically expected in a patient of this age. Skull: Intact Sinuses/Orbits: Paranasal sinuses are clear. Orbits are unremarkable. Other: Mastoid air cells and middle ear cavities are clear. IMPRESSION: No evidence of acute intracranial injury or calvarial fracture. Advanced intracranial vascular calcifications. Clinical correlation for vascular risk factors and aggressive management is recommended. Moderate parenchymal atrophy, more than would be typically expected for a patient of this age. Electronically Signed   By: Helyn Numbers MD   On: 06/03/2020 15:14  DG Chest Port 1 View  Result Date: 06/09/2020 CLINICAL DATA:  Intubation.  OG tube placement. EXAM: PORTABLE CHEST 1 VIEW COMPARISON:  One-view chest x-ray 06/07/2020 FINDINGS: Endotracheal tube is new, terminating 6.5 cm above the carina. NG tube courses off the inferior border of the film. Heart size is normal. Atherosclerotic changes are  noted in the aortic arch. Mild pulmonary vascular congestion is present. Increasing airspace disease is present at the right middle lobe. IMPRESSION: 1. Endotracheal tube terminates 6.5 cm above the carina. 2. NG tube courses off the inferior border of the film. 3. Increasing airspace disease at the right middle lobe concerning for pneumonia versus atelectasis. Electronically Signed   By: Marin Roberts M.D.   On: 06/09/2020 13:41   DG Chest Port 1 View  Result Date: 06/07/2020 CLINICAL DATA:  Cough EXAM: PORTABLE CHEST 1 VIEW COMPARISON:  Chest radiograph 07/28/2019 FINDINGS: Stable cardiomediastinal contours given patient rotation. Several small surgical clips are noted in the right upper lung. Small increased opacity at several surgical clips in the right upper lung, likely technique related. The lungs are otherwise clear. No pneumothorax or significant pleural effusion. No acute finding in the visualized skeleton. IMPRESSION: 1. Small opacity adjacent to several surgical clips in the right upper lung is likely technique related. Consider repeat PA and lateral radiographs. 2.  Lungs are otherwise clear. Electronically Signed   By: Emmaline Kluver M.D.   On: 06/07/2020 09:17   DG Abd Portable 1V  Result Date: 06/09/2020 CLINICAL DATA:  Orogastric tube EXAM: PORTABLE ABDOMEN - 1 VIEW COMPARISON:  None. FINDINGS: Esophagogastric tube with tip and side port below the diaphragm. The transverse and descending colon are gas-filled although not overly distended with scattered stool. Electronic device projects over the central abdomen. IMPRESSION: Esophagogastric tube with tip and side port below the diaphragm. Electronically Signed   By: Lauralyn Primes M.D.   On: 06/09/2020 13:41     Subjective: Pt denies any complaints other than wanting to go home   Discharge Exam: Vitals:   06/22/20 0418 06/22/20 0745  BP: 101/73 92/69  Pulse: 75 78  Resp: 18 18  Temp: 98.6 F (37 C) 97.7 F (36.5 C)   SpO2: 98% 98%   Vitals:   06/21/20 2033 06/22/20 0100 06/22/20 0418 06/22/20 0745  BP: 114/87 101/74 101/73 92/69  Pulse: 84 79 75 78  Resp: 18 20 18 18   Temp: 98.3 F (36.8 C) 98.6 F (37 C) 98.6 F (37 C) 97.7 F (36.5 C)  TempSrc: Oral Oral  Oral  SpO2: 100% 97% 98% 98%  Weight:   66.5 kg   Height:        General: Pt is alert, awake, not in acute distress Cardiovascular:  S1/S2 +, no rubs, no gallops Respiratory: diminished breath sounds b/l  Abdominal: Soft, NT, ND, bowel sounds + Extremities: no edema, no cyanosis    The results of significant diagnostics from this hospitalization (including imaging, microbiology, ancillary and laboratory) are listed below for reference.     Microbiology: No results found for this or any previous visit (from the past 240 hour(s)).   Labs: BNP (last 3 results) No results for input(s): BNP in the last 8760 hours. Basic Metabolic Panel: Recent Labs  Lab 06/17/20 0523 06/17/20 0523 06/18/20 0405 06/19/20 0614 06/20/20 0546 06/21/20 0415 06/22/20 0405  NA 140   < > 140 140 141 141 139  K 3.6   < > 3.9 3.8 4.1 3.9 3.9  CL 101   < >  105 102 104 103 104  CO2 27   < > 27 26 27 27 28   GLUCOSE 88   < > 85 83 89 106* 91  BUN 13   < > 12 11 15 16 18   CREATININE 0.58*   < > 0.48* 0.48* 0.56* 0.67 0.55*  CALCIUM 9.0   < > 9.1 9.5 9.8 9.6 9.3  MG 1.8   < > 1.8 1.8 1.8 1.9 2.0  PHOS 4.1  --  3.5 3.9 4.5 4.2  --    < > = values in this interval not displayed.   Liver Function Tests: Recent Labs  Lab 06/17/20 0523 06/18/20 0405 06/19/20 0614 06/20/20 0546 06/21/20 0415  ALBUMIN 3.4* 3.6 3.5 3.6 3.5   No results for input(s): LIPASE, AMYLASE in the last 168 hours. No results for input(s): AMMONIA in the last 168 hours. CBC: Recent Labs  Lab 06/17/20 0523 06/17/20 0523 06/18/20 0405 06/19/20 0614 06/20/20 0546 06/21/20 0415 06/22/20 0405  WBC 10.0   < > 13.0* 11.5* 13.4* 12.1* 12.0*  NEUTROABS 5.8  --  8.4* 7.2 8.5*  7.7  --   HGB 11.0*   < > 11.2* 11.8* 11.2* 11.8* 11.8*  HCT 33.0*   < > 33.7* 35.1* 34.0* 34.1* 35.5*  MCV 102.2*   < > 102.1* 101.7* 102.1* 99.1 102.0*  PLT 509*   < > 543* 575* 559* 565* 513*   < > = values in this interval not displayed.   Cardiac Enzymes: No results for input(s): CKTOTAL, CKMB, CKMBINDEX, TROPONINI in the last 168 hours. BNP: Invalid input(s): POCBNP CBG: Recent Labs  Lab 06/21/20 1159 06/21/20 1557 06/21/20 2034 06/22/20 0744 06/22/20 1216  GLUCAP 91 98 107* 78   78 94   D-Dimer No results for input(s): DDIMER in the last 72 hours. Hgb A1c No results for input(s): HGBA1C in the last 72 hours. Lipid Profile No results for input(s): CHOL, HDL, LDLCALC, TRIG, CHOLHDL, LDLDIRECT in the last 72 hours. Thyroid function studies No results for input(s): TSH, T4TOTAL, T3FREE, THYROIDAB in the last 72 hours.  Invalid input(s): FREET3 Anemia work up No results for input(s): VITAMINB12, FOLATE, FERRITIN, TIBC, IRON, RETICCTPCT in the last 72 hours. Urinalysis    Component Value Date/Time   COLORURINE YELLOW (A) 06/07/2020 1459   APPEARANCEUR HAZY (A) 06/07/2020 1459   APPEARANCEUR Clear 01/08/2014 1537   LABSPEC 1.018 06/07/2020 1459   LABSPEC 1.003 01/08/2014 1537   PHURINE 5.0 06/07/2020 1459   GLUCOSEU NEGATIVE 06/07/2020 1459   GLUCOSEU 50 mg/dL 06/09/2020 06/09/2020   HGBUR NEGATIVE 06/07/2020 1459   BILIRUBINUR NEGATIVE 06/07/2020 1459   BILIRUBINUR Negative 01/08/2014 1537   KETONESUR NEGATIVE 06/07/2020 1459   PROTEINUR NEGATIVE 06/07/2020 1459   NITRITE NEGATIVE 06/07/2020 1459   LEUKOCYTESUR NEGATIVE 06/07/2020 1459   LEUKOCYTESUR Negative 01/08/2014 1537   Sepsis Labs Invalid input(s): PROCALCITONIN,  WBC,  LACTICIDVEN Microbiology No results found for this or any previous visit (from the past 240 hour(s)).   Time coordinating discharge: Over 30 minutes  SIGNED:   06/09/2020, MD  [facility] 06/22/2020, 5:01 PM Pager    If 7PM-7AM, please contact night-coverage [email or web address]

## 2020-06-22 NOTE — Progress Notes (Addendum)
PROGRESS NOTE    Jason Atkins  WHQ:[id number] DOB: [birth year] DOA: 06/01/2020 PCP: [facility]   HPI was taken from Dr. Clyde Lundborg: Jason Atkins is a 49 y.o. male with medical history significant of hypertension, CAD, CABG, alcohol abuse, tobacco abuse, hepatic encephalopathy, who presents with alcohol withdrawal, homicidal ideations.  Per his wife, patient has been heavily drinking, approximately 25-30 beers each time.  He has decreased oral intake recently.  He has been intermittently confused.  He fell at Sunday morning, no loss of consciousness.  Not sure if he injured his head.  No unilateral numbness or tingling in his extremities. No facial droop or slurred speech. Patient has intermittent diarrhea in the past, but no diarrhea in the past several days.  Patient denies nausea, vomiting, abdominal pain.  Patient denies chest pain, shortness of breath, cough.  No fever or chills. Per EDP, pt reportedly had homicidal ideation, but the patient denies suicidal or homicidal ideations now.  ED Course: pt was found to have WBC 4.4, positive UDS for benzo, negative Covid PCR, alcohol level 229, electrolytes renal function okay, abnormal liver function (ALP 75, AST 120, ALT 50, total bilirubin 1.0), thrombocytopenia with platelets of 115, temperature normal, blood pressure 162/101, tachycardia, tachypnea, oxygen saturation 98% on room air. Patient is placed on med surge bed for observation. Psychiatry, Dr. Smith Robert is consulted. Pt is IVC'ed.    Assessment & Plan:   Principal Problem:   Alcohol withdrawal (HCC) Active Problems:   Alcohol abuse   Hypertension   Coronary artery disease   Thrombocytopenia (HCC)   Tobacco abuse   Abnormal LFTs   Protein-calorie malnutrition, severe   Alcohol use disorder, severe, dependence (HCC)   Major depressive disorder, recurrent severe without psychotic features (HCC)  Alcohol withdrawal syndrome: drinks 25-30 beers daily. Continues to improve  each day. Previously on precedex & ativan for w/drawal. Transferred to the ICU on 7/21 and intubated and placed on sedation. Self extubated 7/23 and remained on precedex that has since been weaned off. Continue on librium, clonidine, valium. folic acid, multivitamin, &  thiamine.  Bipolar disorder: unknown type or severity. Continue on zyprexa, benztropine  Homicidal ideation: had been reporting homicidal ideations at home. Placed on IVC, psychiatry following. Medically stable for transfer to inpatient psych facility still, unable to find placement so far  Dysphagia: continue on dysphagia diet 3 as per speech   Aspiration pneumonia: complete a course of abx. Encourage incentive spirometry   Thrombocytosis: etiology unclear, likely reactive. Labile   Macrocytic anemia: secondary to alcohol abuse. H&H are stable. No need for a transfusion at this time   CAD: s/p CABG. Continue on aspirn   Essential hypertension: continue on clonidine   Tobacco use disorder: nicotine patch to prevent w/drawal. Smoking cessation counseling  BPH: continue on tamsulosin   Generalized weakness: PT is no longer following the pt as they have signed off   DVT prophylaxis: lovenox  Code Status: full  Family Communication:  Disposition Plan: likely d/c to inpatient psych facility   Status is: Inpatient  Remains inpatient appropriate because:Unsafe d/c plan.  Still waiting to find an inpatient psych facility to transfer the pt to   Dispo: The patient is from: Home              Anticipated d/c is to: inpatient psych facility               Anticipated d/c date is: how many ever days it takes to transfer the  pt to an inpatient psych facility               Patient currently is medically stable to d/c.      Consultants:   Psych     Procedures:    Antimicrobials:    Subjective: Pt c/o fatigue  Objective: Vitals:   06/21/20 1836 06/21/20 2033 06/22/20 0100 06/22/20 0418  BP: 118/87  114/87 101/74 101/73  Pulse: 94 84 79 75  Resp: 20 18 20 18   Temp: 98.5 F (36.9 C) 98.3 F (36.8 C) 98.6 F (37 C) 98.6 F (37 C)  TempSrc:  Oral Oral   SpO2: 93% 100% 97% 98%  Weight:    66.5 kg  Height:       No intake or output data in the 24 hours ending 06/22/20 0716 Filed Weights   06/19/20 0555 06/21/20 0500 06/22/20 0418  Weight: 67.4 kg 68.1 kg 66.5 kg    Examination:  General exam:Appears calm and comfortable  Respiratory system:decreasedbreath sounds b/l. No wheezes Cardiovascular system:S1 &S2 +. No rubs, gallops. Gastrointestinal system:Abdomen is nondistended, soft and nontender. Normalbowel sounds heard. Central nervous system:Lethargic.  Moves all 4 extremities  Psychiatry:Judgement and insight appear inappropriate. Flat mood and affect     Data Reviewed: I have personally reviewed following labs and imaging studies  CBC: Recent Labs  Lab 06/17/20 0523 06/17/20 0523 06/18/20 0405 06/19/20 0614 06/20/20 0546 06/21/20 0415 06/22/20 0405  WBC 10.0   < > 13.0* 11.5* 13.4* 12.1* 12.0*  NEUTROABS 5.8  --  8.4* 7.2 8.5* 7.7  --   HGB 11.0*   < > 11.2* 11.8* 11.2* 11.8* 11.8*  HCT 33.0*   < > 33.7* 35.1* 34.0* 34.1* 35.5*  MCV 102.2*   < > 102.1* 101.7* 102.1* 99.1 102.0*  PLT 509*   < > 543* 575* 559* 565* 513*   < > = values in this interval not displayed.   Basic Metabolic Panel: Recent Labs  Lab 06/17/20 0523 06/17/20 0523 06/18/20 0405 06/19/20 0614 06/20/20 0546 06/21/20 0415 06/22/20 0405  NA 140   < > 140 140 141 141 139  K 3.6   < > 3.9 3.8 4.1 3.9 3.9  CL 101   < > 105 102 104 103 104  CO2 27   < > 27 26 27 27 28   GLUCOSE 88   < > 85 83 89 106* 91  BUN 13   < > 12 11 15 16 18   CREATININE 0.58*   < > 0.48* 0.48* 0.56* 0.67 0.55*  CALCIUM 9.0   < > 9.1 9.5 9.8 9.6 9.3  MG 1.8   < > 1.8 1.8 1.8 1.9 2.0  PHOS 4.1  --  3.5 3.9 4.5 4.2  --    < > = values in this interval not displayed.   GFR: Estimated Creatinine  Clearance: 106.2 mL/min (A) (by C-G formula based on SCr of 0.55 mg/dL (L)). Liver Function Tests: Recent Labs  Lab 06/17/20 0523 06/18/20 0405 06/19/20 0614 06/20/20 0546 06/21/20 0415  ALBUMIN 3.4* 3.6 3.5 3.6 3.5   No results for input(s): LIPASE, AMYLASE in the last 168 hours. No results for input(s): AMMONIA in the last 168 hours. Coagulation Profile: No results for input(s): INR, PROTIME in the last 168 hours. Cardiac Enzymes: No results for input(s): CKTOTAL, CKMB, CKMBINDEX, TROPONINI in the last 168 hours. BNP (last 3 results) No results for input(s): PROBNP in the last 8760 hours. HbA1C: No results for input(s): HGBA1C  in the last 72 hours. CBG: Recent Labs  Lab 06/20/20 2214 06/21/20 0739 06/21/20 1159 06/21/20 1557 06/21/20 2034  GLUCAP 99 87 91 98 107*   Lipid Profile: No results for input(s): CHOL, HDL, LDLCALC, TRIG, CHOLHDL, LDLDIRECT in the last 72 hours. Thyroid Function Tests: No results for input(s): TSH, T4TOTAL, FREET4, T3FREE, THYROIDAB in the last 72 hours. Anemia Panel: No results for input(s): VITAMINB12, FOLATE, FERRITIN, TIBC, IRON, RETICCTPCT in the last 72 hours. Sepsis Labs: No results for input(s): PROCALCITON, LATICACIDVEN in the last 168 hours.  No results found for this or any previous visit (from the past 240 hour(s)).       Radiology Studies: No results found.      Scheduled Meds: . aspirin EC  81 mg Oral Daily  . benztropine  0.5 mg Oral BID  . chlordiazePOXIDE  50 mg Oral BID  . cloNIDine  0.1 mg Oral TID  . docusate sodium  200 mg Oral BID  . enoxaparin (LOVENOX) injection  40 mg Subcutaneous Q24H  . folic acid  1 mg Oral Daily  . haloperidol  2.5 mg Oral BID  . multivitamin with minerals  1 tablet Oral Daily  . nicotine  21 mg Transdermal Daily  . OLANZapine zydis  20 mg Oral QHS  . pneumococcal 23 valent vaccine  0.5 mL Intramuscular Tomorrow-1000  . tamsulosin  0.4 mg Oral QPC supper  . thiamine  100 mg Oral  Daily   Or  . thiamine  100 mg Intravenous Daily   Continuous Infusions: . sodium chloride       LOS: 18 days    Time spent: 20 mins     Charise Killian, MD [facility] Pager [phone]  If 7PM-7AM, please contact night-coverage [email or web address] 06/22/2020, 7:16 AM

## 2020-06-22 NOTE — TOC Progression Note (Signed)
Transition of Care [facility]) - Progression Note    Patient Details  Name: Jason Atkins MRN: [id number] Date of Birth: Aug 22, 1971  Transition of Care [facility]) CM/SW Contact  Pierina Schuknecht, Lemar Livings, LCSW Phone Number: 06/22/2020, 3:55 PM  Clinical Narrative:  Spoke with pt's wife who called to ask the reason whey he can not go to a inpt psych unit she was promised he would be going by two other MD's. She wants to talk with Dr. Gerre Pebbles to ask him some questions. Have secured messaged him to call her. She voiced: " Do you want me to come and get him and take him to [facility], I can." Aware pt is on a nectar thick liquid diet, but speech to see him again.  Will work on discharge plans.         Expected Discharge Plan and Services                                                 Social Determinants of Health (SDOH) Interventions    Readmission Risk Interventions No flowsheet data found.

## 2020-06-22 NOTE — Plan of Care (Signed)
  Problem: Health Behavior/Discharge Planning: Goal: Ability to manage health-related needs will improve Outcome: Not Progressing   Problem: Clinical Measurements: Goal: Ability to maintain clinical measurements within normal limits will improve Outcome: Progressing   Problem: Education: Goal: Knowledge of General Education information will improve Description: Including pain rating scale, medication(s)/side effects and non-pharmacologic comfort measures Outcome: Not Progressing   Problem: Health Behavior/Discharge Planning: Goal: Ability to manage health-related needs will improve Outcome: Not Progressing   Problem: Clinical Measurements: Goal: Ability to maintain clinical measurements within normal limits will improve Outcome: Progressing Goal: Will remain free from infection Outcome: Progressing Goal: Diagnostic test results will improve Outcome: Progressing Goal: Respiratory complications will improve Outcome: Progressing Goal: Cardiovascular complication will be avoided Outcome: Progressing   Problem: Clinical Measurements: Goal: Ability to maintain clinical measurements within normal limits will improve Outcome: Progressing   Problem: Clinical Measurements: Goal: Will remain free from infection Outcome: Progressing   Problem: Clinical Measurements: Goal: Diagnostic test results will improve Outcome: Progressing   Problem: Clinical Measurements: Goal: Respiratory complications will improve Outcome: Progressing   Problem: Clinical Measurements: Goal: Cardiovascular complication will be avoided Outcome: Progressing   Problem: Coping: Goal: Level of anxiety will decrease Outcome: Progressing   Problem: Coping: Goal: Level of anxiety will decrease Outcome: Progressing   Problem: Elimination: Goal: Will not experience complications related to bowel motility Outcome: Not Progressing Goal: Will not experience complications related to urinary  retention Outcome: Progressing   Problem: Elimination: Goal: Will not experience complications related to bowel motility Outcome: Not Progressing   Problem: Elimination: Goal: Will not experience complications related to urinary retention Outcome: Progressing   Problem: Pain Managment: Goal: General experience of comfort will improve Outcome: Progressing   Problem: Pain Managment: Goal: General experience of comfort will improve Outcome: Progressing   Problem: Safety: Goal: Ability to remain free from injury will improve Outcome: Progressing   Problem: Safety: Goal: Ability to remain free from injury will improve Outcome: Progressing   Problem: Skin Integrity: Goal: Risk for impaired skin integrity will decrease Outcome: Progressing   Problem: Skin Integrity: Goal: Risk for impaired skin integrity will decrease Outcome: Progressing

## 2020-06-22 NOTE — Consult Note (Signed)
[facility] Face-to-Face Psychiatry Consult   Reason for Consult: Follow-up consult for this 49 year old man with alcohol abuse.  Patient seen chart reviewed.  Patient was cooperative with interview. Referring Physician: Mayford Knife Patient Identification: Jason Atkins MRN:  [id number] Principal Diagnosis: Alcohol withdrawal (HCC) Diagnosis:  Principal Problem:   Alcohol withdrawal (HCC) Active Problems:   Alcohol abuse   Hypertension   Coronary artery disease   Thrombocytopenia (HCC)   Tobacco abuse   Abnormal LFTs   Protein-calorie malnutrition, severe   Alcohol use disorder, severe, dependence (HCC)   Major depressive disorder, recurrent severe without psychotic features (HCC)   Total Time spent with patient: 30 minutes  Subjective:   Jason Atkins is a 49 y.o. male patient admitted with "I just feel anxious to get home".  HPI: Follow-up note for this 49 year old man who is been in the hospital for several weeks now.  He came into the hospital with agitation and signs of alcohol withdrawal.  Has had an extended hospitalization for complicated alcohol withdrawal.  Involuntary commitment papers were filed when he was acutely agitated reporting suicidal and homicidal thoughts.  On interview today the patient was cooperative and pleasant.  I did find that he is still somewhat disoriented.  He identified our current location as being the [facility] detention center.  He called the year 2022.  Patient says he is anxious to get back home because he has lots of little projects around the house to work on.  He denied suicidal or homicidal thought at all.  He said he recalled coming to the hospital after he had pointed his fingers that someone.  He cannot make much more sense of it than this.  He does admit that he drinks although he minimizes it.  Patient denies any current hallucinations.  No suicidal thoughts.  Affect was euthymic somewhat constricted.  Past Psychiatric History: Established history of alcohol  abuse with prior history for complicated withdrawal.  Risk to Self: Suicidal Ideation: No Suicidal Intent: No Is patient at risk for suicide?: No Suicidal Plan?: No Access to Means: No Triggers for Past Attempts: None known Intentional Self Injurious Behavior: None Risk to Others:   Prior Inpatient Therapy: Prior Inpatient Therapy: Yes Prior Outpatient Therapy:    Past Medical History:  Past Medical History:  Diagnosis Date  . Alcohol abuse   . Bipolar disorder (HCC)   . Coronary artery disease   . Heart attack (HCC)    x4  . Hx of CABG   . Hypertension   . Tobacco abuse     Past Surgical History:  Procedure Laterality Date  . CARDIAC SURGERY    . CORONARY ARTERY BYPASS GRAFT     Family History:  Family History  Problem Relation Age of Onset  . Diabetes Mellitus II Father   . Heart attack Father    Family Psychiatric  History: None reported Social History:  Social History   Substance and Sexual Activity  Alcohol Use Yes   Comment: 2 40s per day     Social History   Substance and Sexual Activity  Drug Use No    Social History   Socioeconomic History  . Marital status: Married    Spouse name: Not on file  . Number of children: Not on file  . Years of education: Not on file  . Highest education level: Not on file  Occupational History  . Not on file  Tobacco Use  . Smoking status: Current Every Day Smoker    Packs/day:  0.50    Types: Cigarettes  . Smokeless tobacco: Never Used  Substance and Sexual Activity  . Alcohol use: Yes    Comment: 2 40s per day  . Drug use: No  . Sexual activity: Not on file  Other Topics Concern  . Not on file  Social History Narrative  . Not on file   Social Determinants of Health   Financial Resource Strain:   . Difficulty of Paying Living Expenses:   Food Insecurity:   . Worried About Programme researcher, broadcasting/film/videounning Out of Food in the Last Year:   . Baristaan Out of Food in the Last Year:   Transportation Needs:   . Freight forwarderLack of Transportation  (Medical):   Marland Kitchen. Lack of Transportation (Non-Medical):   Physical Activity:   . Days of Exercise per Week:   . Minutes of Exercise per Session:   Stress:   . Feeling of Stress :   Social Connections:   . Frequency of Communication with Friends and Family:   . Frequency of Social Gatherings with Friends and Family:   . Attends Religious Services:   . Active Member of Clubs or Organizations:   . Attends BankerClub or Organization Meetings:   Marland Kitchen. Marital Status:    Additional Social History:    Allergies:   Allergies  Allergen Reactions  . Almond Oil Other (See Comments)    Reaction: unknown  . Cashew Nut Oil Other (See Comments)    Reaction: unknown  . Benadryl [Diphenhydramine] Other (See Comments)    Reaction: unknown  . Ibuprofen Hives    Labs:  Results for orders placed or performed during the hospital encounter of 06/01/20 (from the past 48 hour(s))  Glucose, capillary     Status: None   Collection Time: 06/20/20  4:28 PM  Result Value Ref Range   Glucose-Capillary 91 70 - 99 mg/dL    Comment: Glucose reference range applies only to samples taken after fasting for at least 8 hours.  Glucose, capillary     Status: None   Collection Time: 06/20/20 10:14 PM  Result Value Ref Range   Glucose-Capillary 99 70 - 99 mg/dL    Comment: Glucose reference range applies only to samples taken after fasting for at least 8 hours.  CBC with Differential/Platelet     Status: Abnormal   Collection Time: 06/21/20  4:15 AM  Result Value Ref Range   WBC 12.1 (H) 4.0 - 10.5 K/uL   RBC 3.44 (L) 4.22 - 5.81 MIL/uL   Hemoglobin 11.8 (L) 13.0 - 17.0 g/dL   HCT 69.634.1 (L) 39 - 52 %   MCV 99.1 80.0 - 100.0 fL   MCH 34.3 (H) 26.0 - 34.0 pg   MCHC 34.6 30.0 - 36.0 g/dL   RDW 29.512.8 28.411.5 - 13.215.5 %   Platelets 565 (H) 150 - 400 K/uL   nRBC 0.0 0.0 - 0.2 %   Neutrophils Relative % 64 %   Neutro Abs 7.7 1.7 - 7.7 K/uL   Lymphocytes Relative 19 %   Lymphs Abs 2.3 0.7 - 4.0 K/uL   Monocytes Relative 9 %    Monocytes Absolute 1.1 (H) 0 - 1 K/uL   Eosinophils Relative 5 %   Eosinophils Absolute 0.6 (H) 0 - 0 K/uL   Basophils Relative 2 %   Basophils Absolute 0.2 (H) 0 - 0 K/uL   Immature Granulocytes 1 %   Abs Immature Granulocytes 0.08 (H) 0.00 - 0.07 K/uL    Comment: Performed at [facility],  [address], [place], [place] [address]  Renal function panel     Status: Abnormal   Collection Time: 06/21/20  4:15 AM  Result Value Ref Range   Sodium 141 135 - 145 mmol/L   Potassium 3.9 3.5 - 5.1 mmol/L   Chloride 103 98 - 111 mmol/L   CO2 27 22 - 32 mmol/L   Glucose, Bld 106 (H) 70 - 99 mg/dL    Comment: Glucose reference range applies only to samples taken after fasting for at least 8 hours.   BUN 16 6 - 20 mg/dL   Creatinine, Ser 8.75 0.61 - 1.24 mg/dL   Calcium 9.6 8.9 - 64.3 mg/dL   Phosphorus 4.2 2.5 - 4.6 mg/dL   Albumin 3.5 3.5 - 5.0 g/dL   GFR calc non Af Amer >60 >60 mL/min   GFR calc Af Amer >60 >60 mL/min   Anion gap 11 5 - 15    Comment: Performed at [facility], [address], [place], [place] [address]  Magnesium     Status: None   Collection Time: 06/21/20  4:15 AM  Result Value Ref Range   Magnesium 1.9 1.7 - 2.4 mg/dL    Comment: Performed at [facility], [address] [place], [place] [address]  Glucose, capillary     Status: None   Collection Time: 06/21/20  7:39 AM  Result Value Ref Range   Glucose-Capillary 87 70 - 99 mg/dL    Comment: Glucose reference range applies only to samples taken after fasting for at least 8 hours.  Glucose, capillary     Status: None   Collection Time: 06/21/20 11:59 AM  Result Value Ref Range   Glucose-Capillary 91 70 - 99 mg/dL    Comment: Glucose reference range applies only to samples taken after fasting for at least 8 hours.  Glucose, capillary     Status: None   Collection Time: 06/21/20  3:57 PM  Result Value Ref Range   Glucose-Capillary 98 70 - 99 mg/dL    Comment: Glucose reference  range applies only to samples taken after fasting for at least 8 hours.   Comment 1 Notify RN   Glucose, capillary     Status: Abnormal   Collection Time: 06/21/20  8:34 PM  Result Value Ref Range   Glucose-Capillary 107 (H) 70 - 99 mg/dL    Comment: Glucose reference range applies only to samples taken after fasting for at least 8 hours.  CBC     Status: Abnormal   Collection Time: 06/22/20  4:05 AM  Result Value Ref Range   WBC 12.0 (H) 4.0 - 10.5 K/uL   RBC 3.48 (L) 4.22 - 5.81 MIL/uL   Hemoglobin 11.8 (L) 13.0 - 17.0 g/dL   HCT 60.6 (L) 39 - 52 %   MCV 102.0 (H) 80.0 - 100.0 fL   MCH 33.9 26.0 - 34.0 pg   MCHC 33.2 30.0 - 36.0 g/dL   RDW 30.1 60.1 - 09.3 %   Platelets 513 (H) 150 - 400 K/uL   nRBC 0.0 0.0 - 0.2 %    Comment: Performed at [facility], [address], [place], [place] [address]  Basic metabolic panel     Status: Abnormal   Collection Time: 06/22/20  4:05 AM  Result Value Ref Range   Sodium 139 135 - 145 mmol/L   Potassium 3.9 3.5 - 5.1 mmol/L   Chloride 104 98 - 111 mmol/L   CO2 28 22 -  32 mmol/L   Glucose, Bld 91 70 - 99 mg/dL    Comment: Glucose reference range applies only to samples taken after fasting for at least 8 hours.   BUN 18 6 - 20 mg/dL   Creatinine, Ser 1.30 (L) 0.61 - 1.24 mg/dL   Calcium 9.3 8.9 - 86.5 mg/dL   GFR calc non Af Amer >60 >60 mL/min   GFR calc Af Amer >60 >60 mL/min   Anion gap 7 5 - 15    Comment: Performed at [facility], [address] [place], [place] [address]  Magnesium     Status: None   Collection Time: 06/22/20  4:05 AM  Result Value Ref Range   Magnesium 2.0 1.7 - 2.4 mg/dL    Comment: Performed at [facility], [address] [place], [place] [address]  Glucose, capillary     Status: None   Collection Time: 06/22/20  7:44 AM  Result Value Ref Range   Glucose-Capillary 78 70 - 99 mg/dL    Comment: Glucose reference range applies only to samples taken after fasting for at least 8  hours.  Glucose, capillary     Status: None   Collection Time: 06/22/20  7:44 AM  Result Value Ref Range   Glucose-Capillary 78 70 - 99 mg/dL    Comment: Glucose reference range applies only to samples taken after fasting for at least 8 hours.  Glucose, capillary     Status: None   Collection Time: 06/22/20 12:16 PM  Result Value Ref Range   Glucose-Capillary 94 70 - 99 mg/dL    Comment: Glucose reference range applies only to samples taken after fasting for at least 8 hours.    Current Facility-Administered Medications  Medication Dose Route Frequency Provider Last Rate Last Admin  . 0.9 %  sodium chloride infusion  250 mL Intravenous Continuous Vida Rigger, MD      . acetaminophen (TYLENOL) tablet 650 mg  650 mg Oral Q6H PRN [place], Eric J, DO   650 mg at 06/13/20 1705  . aspirin EC tablet 81 mg  81 mg Oral Daily Lorretta Harp, MD   81 mg at 06/22/20 1006  . benztropine (COGENTIN) tablet 0.5 mg  0.5 mg Oral BID Roselind Messier, MD   0.5 mg at 06/22/20 1007  . bisacodyl (DULCOLAX) EC tablet 10 mg  10 mg Oral Daily PRN Charise Killian, MD   10 mg at 06/21/20 1038  . chlordiazePOXIDE (LIBRIUM) capsule 50 mg  50 mg Oral BID Charise Killian, MD   50 mg at 06/22/20 1005  . chlorproMAZINE (THORAZINE) tablet 10 mg  10 mg Oral TID PRN Eugenie Norrie, NP   10 mg at 06/19/20 2037  . cloNIDine (CATAPRES) tablet 0.1 mg  0.1 mg Oral TID Vida Rigger, MD   0.1 mg at 06/22/20 1006  . docusate sodium (COLACE) capsule 200 mg  200 mg Oral BID Charise Killian, MD   200 mg at 06/22/20 1005  . enoxaparin (LOVENOX) injection 40 mg  40 mg Subcutaneous Q24H Lorretta Harp, MD   40 mg at 06/22/20 0126  . folic acid (FOLVITE) tablet 1 mg  1 mg Oral Daily Harlon Ditty D, NP   1 mg at 06/22/20 1006  . haloperidol (HALDOL) tablet 2.5 mg  2.5 mg Oral BID Roselind Messier, MD   2.5 mg at 06/22/20 1006  . haloperidol lactate (HALDOL) injection 5 mg  5 mg Intravenous Q6H PRN [place], Eric J, DO  5 mg  at 06/17/20 1623  . hydrALAZINE (APRESOLINE) injection 5 mg  5 mg Intravenous Q2H PRN Lorretta Harp, MD      . menthol-cetylpyridinium (CEPACOL) lozenge 3 mg  1 lozenge Oral PRN [place], Eric J, DO      . multivitamin with minerals tablet 1 tablet  1 tablet Oral Daily Harlon Ditty D, NP   1 tablet at 06/22/20 1005  . nicotine (NICODERM CQ - dosed in mg/24 hours) patch 21 mg  21 mg Transdermal Daily Lorretta Harp, MD   21 mg at 06/22/20 1004  . OLANZapine zydis (ZYPREXA) disintegrating tablet 20 mg  20 mg Oral QHS Roselind Messier, MD   20 mg at 06/22/20 0128  . ondansetron (ZOFRAN) injection 4 mg  4 mg Intravenous Q8H PRN Lorretta Harp, MD      . pneumococcal 23 valent vaccine (PNEUMOVAX-23) injection 0.5 mL  0.5 mL Intramuscular Tomorrow-1000 Sreenath, Sudheer B, MD      . tamsulosin (FLOMAX) capsule 0.4 mg  0.4 mg Oral QPC supper Lolita Patella B, MD   0.4 mg at 06/21/20 1725  . thiamine tablet 100 mg  100 mg Oral Daily Lorretta Harp, MD   100 mg at 06/22/20 1005   Or  . thiamine (B-1) injection 100 mg  100 mg Intravenous Daily Lorretta Harp, MD   100 mg at 06/12/20 1006    Musculoskeletal: Strength & Muscle Tone: within normal limits Gait & Station: unsteady Patient leans: Front  Psychiatric Specialty Exam: Physical Exam Vitals and nursing note reviewed.  Constitutional:      Appearance: He is well-developed.  HENT:     Head: Normocephalic and atraumatic.  Eyes:     Conjunctiva/sclera: Conjunctivae normal.     Pupils: Pupils are equal, round, and reactive to light.  Cardiovascular:     Heart sounds: Normal heart sounds.  Pulmonary:     Effort: Pulmonary effort is normal.  Abdominal:     Palpations: Abdomen is soft.  Musculoskeletal:        General: Normal range of motion.     Cervical back: Normal range of motion.  Skin:    General: Skin is warm and dry.  Neurological:     Mental Status: He is alert. He is disoriented.     Motor: Weakness present.     Gait: Gait abnormal.   Psychiatric:        Attention and Perception: He is inattentive.        Mood and Affect: Mood normal.        Speech: Speech is tangential.        Behavior: Behavior is slowed.        Thought Content: Thought content normal.        Cognition and Memory: Cognition is impaired.     Review of Systems  Constitutional: Negative.   HENT: Negative.   Eyes: Negative.   Respiratory: Negative.   Cardiovascular: Negative.   Gastrointestinal: Negative.   Musculoskeletal: Negative.   Skin: Negative.   Neurological: Negative.   Psychiatric/Behavioral: Negative.     Blood pressure 92/69, pulse 78, temperature 97.7 F (36.5 C), temperature source Oral, resp. rate 18, height  (1.803 m), weight 66.5 kg, SpO2 98 %.Body mass index is 20.43 kg/m.  General Appearance: Casual  Eye Contact:  Fair  Speech:  Slow  Volume:  Decreased  Mood:  Euthymic  Affect:  Constricted  Thought Process:  Coherent  Orientation:  Other:  Somewhat disoriented.  I think he  had some idea of the point of the interview but got confused answering specific questions  Thought Content:  Rumination and Tangential  Suicidal Thoughts:  No  Homicidal Thoughts:  No  Memory:  Immediate;   Fair Recent;   Poor Remote;   Poor  Judgement:  Impaired  Insight:  Shallow  Psychomotor Activity:  Decreased  Concentration:  Concentration: Poor  Recall:  Poor  Fund of Knowledge:  Fair  Language:  Fair  Akathisia:  No  Handed:  Right  AIMS (if indicated):     Assets:  Housing  ADL's:  Impaired  Cognition:  Impaired,  Mild  Sleep:        Treatment Plan Summary: Plan Patient with alcohol abuse has gone through extended withdrawal.  On interview today he does not appear to be frankly delirious but does have some memory problems and persistent confusion.  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  At this point however the patient is calm and pleasant and denies any suicidal  or homicidal thought.  There is no indication of other mental health problem other than the alcohol abuse and possible dementia.  No benefit to inpatient hospitalization.  Patient no longer meets commitment criteria.  I am discontinuing the IVC paperwork.  Patient is educated about the dangers of ongoing alcohol abuse and strongly encouraged to stop drinking.  Information communicated to treatment team.  Disposition: Patient does not meet criteria for psychiatric inpatient admission.  Mordecai Rasmussen, MD 06/22/2020 2:00 PM

## 2020-06-24 ENCOUNTER — Emergency Department: Payer: Self-pay

## 2020-06-24 ENCOUNTER — Emergency Department
Admission: EM | Admit: 2020-06-24 | Discharge: 2020-06-24 | Disposition: A | Discharge status: Home / Self Care | Payer: Self-pay | Attending: Emergency Medicine | Admitting: Emergency Medicine

## 2020-06-24 ENCOUNTER — Other Ambulatory Visit: Payer: Self-pay

## 2020-06-24 ENCOUNTER — Encounter: Payer: Self-pay | Admitting: Emergency Medicine

## 2020-06-24 DIAGNOSIS — K219 Gastro-esophageal reflux disease without esophagitis: Secondary | ICD-10-CM | POA: Insufficient documentation

## 2020-06-24 DIAGNOSIS — Z951 Presence of aortocoronary bypass graft: Secondary | ICD-10-CM | POA: Insufficient documentation

## 2020-06-24 DIAGNOSIS — R05 Cough: Secondary | ICD-10-CM | POA: Insufficient documentation

## 2020-06-24 DIAGNOSIS — Z20822 Contact with and (suspected) exposure to covid-19: Secondary | ICD-10-CM | POA: Insufficient documentation

## 2020-06-24 DIAGNOSIS — I2510 Atherosclerotic heart disease of native coronary artery without angina pectoris: Secondary | ICD-10-CM | POA: Insufficient documentation

## 2020-06-24 DIAGNOSIS — R066 Hiccough: Secondary | ICD-10-CM | POA: Insufficient documentation

## 2020-06-24 DIAGNOSIS — F17210 Nicotine dependence, cigarettes, uncomplicated: Secondary | ICD-10-CM | POA: Insufficient documentation

## 2020-06-24 DIAGNOSIS — R059 Cough, unspecified: Secondary | ICD-10-CM

## 2020-06-24 DIAGNOSIS — I10 Essential (primary) hypertension: Secondary | ICD-10-CM | POA: Insufficient documentation

## 2020-06-24 LAB — COMPREHENSIVE METABOLIC PANEL
ALT: 50 U/L — ABNORMAL HIGH (ref 0–44)
AST: 53 U/L — ABNORMAL HIGH (ref 15–41)
Albumin: 4.1 g/dL (ref 3.5–5.0)
Alkaline Phosphatase: 62 U/L (ref 38–126)
Anion gap: 9 (ref 5–15)
BUN: 7 mg/dL (ref 6–20)
CO2: 29 mmol/L (ref 22–32)
Calcium: 9.8 mg/dL (ref 8.9–10.3)
Chloride: 103 mmol/L (ref 98–111)
Creatinine, Ser: 0.68 mg/dL (ref 0.61–1.24)
GFR calc Af Amer: >60 mL/min (ref >60)
GFR calc non Af Amer: >60 mL/min (ref >60)
Glucose, Bld: 88 mg/dL (ref 70–99)
Potassium: 4.1 mmol/L (ref 3.5–5.1)
Sodium: 141 mmol/L (ref 135–145)
Total Bilirubin: 0.6 mg/dL (ref 0.3–1.2)
Total Protein: 7.9 g/dL (ref 6.5–8.1)

## 2020-06-24 LAB — CBC WITH DIFFERENTIAL/PLATELET
Abs Immature Granulocytes: 0.06 10*3/uL (ref 0.00–0.07)
Basophils Absolute: 0.2 10*3/uL — ABNORMAL HIGH (ref 0.0–0.1)
Basophils Relative: 1 %
Eosinophils Absolute: 0.6 10*3/uL — ABNORMAL HIGH (ref 0.0–0.5)
Eosinophils Relative: 4 %
HCT: 35.1 % — ABNORMAL LOW (ref 39.0–52.0)
Hemoglobin: 11.6 g/dL — ABNORMAL LOW (ref 13.0–17.0)
Immature Granulocytes: 0 %
Lymphocytes Relative: 20 %
Lymphs Abs: 2.8 10*3/uL (ref 0.7–4.0)
MCH: 33.8 pg (ref 26.0–34.0)
MCHC: 33 g/dL (ref 30.0–36.0)
MCV: 102.3 fL — ABNORMAL HIGH (ref 80.0–100.0)
Monocytes Absolute: 1.5 10*3/uL — ABNORMAL HIGH (ref 0.1–1.0)
Monocytes Relative: 10 %
Neutro Abs: 9.4 10*3/uL — ABNORMAL HIGH (ref 1.7–7.7)
Neutrophils Relative %: 65 %
Platelets: 461 10*3/uL — ABNORMAL HIGH (ref 150–400)
RBC: 3.43 MIL/uL — ABNORMAL LOW (ref 4.22–5.81)
RDW: 13.1 % (ref 11.5–15.5)
WBC: 14.6 10*3/uL — ABNORMAL HIGH (ref 4.0–10.5)
nRBC: 0 % (ref 0.0–0.2)

## 2020-06-24 LAB — LIPASE, BLOOD: Lipase: 45 U/L (ref 11–51)

## 2020-06-24 LAB — ETHANOL: Alcohol, Ethyl (B): <10 mg/dL (ref <10)

## 2020-06-24 LAB — SARS CORONAVIRUS 2 BY RT PCR (HOSPITAL ORDER, PERFORMED IN ~~LOC~~ HOSPITAL LAB): SARS Coronavirus 2: NEGATIVE

## 2020-06-24 MED ORDER — BACLOFEN 10 MG PO TABS: 10.0000 mg | ORAL_TABLET | Freq: Three times a day (TID) | ORAL | 0 refills | Status: AC | PRN

## 2020-06-24 MED ORDER — METOCLOPRAMIDE HCL 10 MG PO TABS: 10.0000 mg | ORAL_TABLET | Freq: Three times a day (TID) | ORAL | 0 refills | Status: DC | PRN

## 2020-06-24 MED ORDER — BACLOFEN 10 MG PO TABS
5.0000 mg | ORAL_TABLET | Freq: Once | ORAL | Status: AC
  Administered 2020-06-24: 5 mg via ORAL
  Filled 2020-06-24: qty 0.5

## 2020-06-24 MED ORDER — IOHEXOL 300 MG/ML  SOLN
100.0000 mL | Freq: Once | INTRAMUSCULAR | Status: AC | PRN
  Administered 2020-06-24: 100 mL via INTRAVENOUS
  Filled 2020-06-24: qty 100

## 2020-06-24 MED ORDER — SODIUM CHLORIDE 0.9 % IV BOLUS
1000.0000 mL | Freq: Once | INTRAVENOUS | Status: AC
  Administered 2020-06-24: 1000 mL via INTRAVENOUS

## 2020-06-24 MED ORDER — METOCLOPRAMIDE HCL 10 MG PO TABS
10.0000 mg | ORAL_TABLET | Freq: Once | ORAL | Status: AC
  Administered 2020-06-24: 10 mg via ORAL
  Filled 2020-06-24: qty 1

## 2020-06-24 MED ORDER — OMEPRAZOLE 10 MG PO CPDR
10.0000 mg | DELAYED_RELEASE_CAPSULE | Freq: Every day | ORAL | 2 refills | Status: DC
Start: 2020-06-24 — End: 2021-01-25

## 2020-06-24 NOTE — ED Notes (Signed)
Pt ambulate to toilet across room independently with a steady gait.

## 2020-06-24 NOTE — ED Triage Notes (Signed)
Pt in via [facility] from home, reports sore throat and cough x 3-4 days.  Vitals WDL, NAD noted at this time.

## 2020-06-24 NOTE — ED Provider Notes (Signed)
[facility] Emergency Department Provider Note  ____________________________________________  Time seen: Approximately 4:29 PM  I have reviewed the triage vital signs and the nursing notes.   HISTORY  Chief Complaint Sore Throat and Cough    HPI Jason Atkins is a 49 y.o. male who presents the emergency department complaining of hiccups and cough x3 days.  Patient states that he has had consistent hiccups, cough x3 days.  Patient denies any precipitating factor.  Nonproductive cough.  He denies any fevers or chills, nasal congestion, sore throat.  Patient denies any nausea or emesis.  No diarrhea or constipation.  Patient denies any abdominal pain.  Patient states that he contacted GI and they advised him to come to the emergency department to make sure he had not aspirated. During our conversation, patient did have a dry nonproductive cough intermittently.  Patient was having persistent hiccups every 5 to 6 seconds.  He reports he is having difficulty eating and drinking because of the hiccups.  Hiccups have been persisting at this rate for the past 3 days.  No history of same.  Patient has a history of alcohol abuse, bipolar disorder, coronary artery disease, hypertension, alcohol withdrawal, hepatic encephalopathy.  No complaints of chronic medical issues at this time.         Past Medical History:  Diagnosis Date  . Alcohol abuse   . Bipolar disorder (HCC)   . Coronary artery disease   . Heart attack (HCC)    x4  . Hx of CABG   . Hypertension   . Tobacco abuse     Patient Active Problem List   Diagnosis Date Noted  . Alcohol use disorder, severe, dependence (HCC) 06/13/2020  . Major depressive disorder, recurrent severe without psychotic features (HCC) 06/13/2020  . Protein-calorie malnutrition, severe 06/11/2020  . Hypertension   . Coronary artery disease   . Alcohol withdrawal (HCC)   . Thrombocytopenia (HCC)   . Tobacco abuse   . Abnormal LFTs    . Hepatic encephalopathy (HCC) 07/28/2019  . Alcohol abuse 10/01/2018    Past Surgical History:  Procedure Laterality Date  . CARDIAC SURGERY    . CORONARY ARTERY BYPASS GRAFT      Prior to Admission medications   Medication Sig Start Date End Date Taking? Authorizing Provider  aspirin EC 81 MG tablet Take 81 mg by mouth daily. Swallow whole.    [provider]  baclofen (LIORESAL) 10 MG tablet Take 1 tablet (10 mg total) by mouth 3 (three) times daily as needed for up to 10 days (Hiccups). 06/24/20 07/04/20  Clerence Gubser, Delorise Royals, PA-C  benztropine (COGENTIN) 0.5 MG tablet Take 1 tablet (0.5 mg total) by mouth 2 (two) times daily. 06/22/20 07/22/20  Charise Killian, MD  chlordiazePOXIDE (LIBRIUM) 25 MG capsule Take 2 capsules (50 mg total) by mouth 2 (two) times daily for 7 days. 06/22/20 06/29/20  Charise Killian, MD  hydrochlorothiazide (HYDRODIURIL) 25 MG tablet Take 25 mg by mouth daily. Patient not taking: Reported on 06/02/2020    [provider]  metoCLOPramide (REGLAN) 10 MG tablet Take 1 tablet (10 mg total) by mouth every 8 (eight) hours as needed for up to 10 days (hiccups). 06/24/20 07/04/20  Carlita Whitcomb, Delorise Royals, PA-C  Multiple Vitamin (MULTIVITAMIN WITH MINERALS) TABS tablet Take 1 tablet by mouth daily. Patient not taking: Reported on 06/02/2020    [provider]  OLANZapine zydis (ZYPREXA) 20 MG disintegrating tablet Take 1 tablet (20 mg total) by  mouth at bedtime. 06/22/20 07/22/20  Charise Killian, MD  omeprazole (PRILOSEC) 10 MG capsule Take 1 capsule (10 mg total) by mouth daily. 06/24/20   Zayvier Caravello, Delorise Royals, PA-C  tamsulosin (FLOMAX) 0.4 MG CAPS capsule Take 1 capsule (0.4 mg total) by mouth daily after supper. 06/22/20 07/22/20  Charise Killian, MD    Allergies Almond oil, Cashew nut oil, Ibuprofen, and Benadryl [diphenhydramine]  Family History  Problem Relation Age of Onset  . Diabetes Mellitus II Father   . Heart attack Father      Social History Social History   Tobacco Use  . Smoking status: Current Every Day Smoker    Packs/day: 0.50    Types: Cigarettes  . Smokeless tobacco: Never Used  Vaping Use  . Vaping Use: Never used  Substance Use Topics  . Alcohol use: Yes    Alcohol/week: 28.0 standard drinks    Types: 28 Cans of beer per week  . Drug use: No     Review of Systems  Constitutional: No fever/chills.  Positive for constant hiccups. Eyes: No visual changes. No discharge ENT: No upper respiratory complaints. Cardiovascular: no chest pain. Respiratory: 3 days of nonproductive cough. No SOB. Gastrointestinal: No abdominal pain.  No nausea, no vomiting.  No diarrhea.  No constipation. Genitourinary: Negative for dysuria. No hematuria Musculoskeletal: Negative for musculoskeletal pain. Skin: Negative for rash, abrasions, lacerations, ecchymosis. Neurological: Negative for headaches, focal weakness or numbness. 10-point ROS otherwise negative.  ____________________________________________   PHYSICAL EXAM:  VITAL SIGNS: ED Triage Vitals  Enc Vitals Group     BP 06/24/20 1323 131/90     Pulse Rate 06/24/20 1323 99     Resp 06/24/20 1323 17     Temp 06/24/20 1323 99.4 F (37.4 C)     Temp Source 06/24/20 1323 Oral     SpO2 06/24/20 1323 100 %     Weight 06/24/20 1324 170 lb (77.1 kg)     Height 06/24/20 1324 5\' 11"  (1.803 m)     Head Circumference --      Peak Flow --      Pain Score 06/24/20 1323 8     Pain Loc --      Pain Edu? --      Excl. in GC? --      Constitutional: Alert and oriented. Well appearing and in no acute distress.  Patient has hiccups every 3 to 5 seconds.  Eyes: Conjunctivae are normal. PERRL. EOMI. Head: Atraumatic. ENT:      Ears:       Nose: No congestion/rhinnorhea.      Mouth/Throat: Mucous membranes are moist.  Neck: No stridor.  Neck is supple full range of motion Hematological/Lymphatic/Immunilogical: No cervical  lymphadenopathy. Cardiovascular: Normal rate, regular rhythm. Normal S1 and S2.  Good peripheral circulation. Respiratory: Normal respiratory effort without tachypnea or retractions. Lungs CTAB.  Patient does have a nonproductive cough associated with hiccuping.  Good air entry to the bases with no decreased or absent breath sounds. Gastrointestinal: Bowel sounds 4 quadrants. Soft and nontender to palpation. No guarding or rigidity. No palpable masses. No distention. No CVA tenderness. Musculoskeletal: Full range of motion to all extremities. No gross deformities appreciated. Neurologic:  Normal speech and language. No gross focal neurologic deficits are appreciated.  Skin:  Skin is warm, dry and intact. No rash noted. Psychiatric: Mood and affect are normal. Speech and behavior are normal. Patient exhibits appropriate insight and judgement.   ____________________________________________   LABS (all  labs ordered are listed, but only abnormal results are displayed)  Labs Reviewed  COMPREHENSIVE METABOLIC PANEL - Abnormal; Notable for the following components:      Result Value   AST 53 (*)    ALT 50 (*)    All other components within normal limits  CBC WITH DIFFERENTIAL/PLATELET - Abnormal; Notable for the following components:   WBC 14.6 (*)    RBC 3.43 (*)    Hemoglobin 11.6 (*)    HCT 35.1 (*)    MCV 102.3 (*)    Platelets 461 (*)    Neutro Abs 9.4 (*)    Monocytes Absolute 1.5 (*)    Eosinophils Absolute 0.6 (*)    Basophils Absolute 0.2 (*)    All other components within normal limits  SARS CORONAVIRUS 2 BY RT PCR (HOSPITAL ORDER, PERFORMED IN [facility])  ETHANOL  LIPASE, BLOOD   ____________________________________________  EKG   ____________________________________________  RADIOLOGY I personally viewed and evaluated these images as part of my medical decision making, as well as reviewing the written report by the radiologist.  DG Chest 2  View  Result Date: 06/24/2020 CLINICAL DATA:  Cough and hiccups x3 days. EXAM: CHEST - 2 VIEW COMPARISON:  June 09, 2020 FINDINGS: Multiple sternal wires are seen. There is no evidence of acute infiltrate, pleural effusion or pneumothorax. The heart size and mediastinal contours are within normal limits. The visualized skeletal structures are unremarkable. IMPRESSION: No active cardiopulmonary disease. Electronically Signed   By: Aram Candelahaddeus  Houston M.D.   On: 06/24/2020 17:19   DG Abdomen 1 View  Result Date: 06/24/2020 CLINICAL DATA:  Cough and hiccups x3 days. EXAM: ABDOMEN - 1 VIEW COMPARISON:  June 09, 2020 FINDINGS: The bowel gas pattern is normal. A large amount of stool is seen throughout the colon. No radio-opaque calculi or other significant radiographic abnormality are seen. IMPRESSION: Negative. Electronically Signed   By: Aram Candelahaddeus  Houston M.D.   On: 06/24/2020 17:21   CT ABDOMEN PELVIS W CONTRAST  Result Date: 06/24/2020 CLINICAL DATA:  Chest pain and persistent hiccups x3 days. EXAM: CT ABDOMEN AND PELVIS WITH CONTRAST TECHNIQUE: Multidetector CT imaging of the abdomen and pelvis was performed using the standard protocol following bolus administration of intravenous contrast. CONTRAST:  100mL OMNIPAQUE IOHEXOL 300 MG/ML  SOLN COMPARISON:  None. FINDINGS: Lower chest: Multiple sternal wires are seen. Hepatobiliary: No focal liver abnormality is seen. No gallstones, gallbladder wall thickening, or biliary dilatation. Pancreas: Unremarkable. No pancreatic ductal dilatation or surrounding inflammatory changes. Spleen: Normal in size without focal abnormality. Adrenals/Urinary Tract: Adrenal glands are unremarkable. Kidneys are normal, without renal calculi, focal lesion, or hydronephrosis. Bladder is unremarkable. Stomach/Bowel: There is a small hiatal hernia. The appendix is not clearly identified. No evidence of bowel wall thickening, distention, or inflammatory changes. Vascular/Lymphatic: There  is marked severity calcification of the abdominal aorta and bilateral common iliac arteries, without evidence of aneurysmal dilatation. No enlarged abdominal or pelvic lymph nodes. Reproductive: Prostate is unremarkable. Other: No abdominal wall hernia or abnormality. No abdominopelvic ascites. Musculoskeletal: No acute or significant osseous findings. IMPRESSION: 1. No acute intra-abdominal findings. 2. Small hiatal hernia. 3. Aortic atherosclerosis. Aortic Atherosclerosis (ICD10-I70.0). Electronically Signed   By: Aram Candelahaddeus  Houston M.D.   On: 06/24/2020 17:41    ____________________________________________    PROCEDURES  Procedure(s) performed:    Procedures    Medications  baclofen (LIORESAL) tablet 5 mg (5 mg Oral Given 06/24/20 1712)  metoCLOPramide (REGLAN) tablet 10 mg (10 mg  Oral Given 06/24/20 1712)  sodium chloride 0.9 % bolus 1,000 mL (0 mLs Intravenous Stopped 06/24/20 1812)  iohexol (OMNIPAQUE) 300 MG/ML solution 100 mL (100 mLs Intravenous Contrast Given 06/24/20 1719)     ____________________________________________   INITIAL IMPRESSION / ASSESSMENT AND PLAN / ED COURSE  Pertinent labs & imaging results that were available during my care of the patient were reviewed by me and considered in my medical decision making (see chart for details).  Review of the  [place] was performed in accordance of the [facility] prior to dispensing any controlled drugs.           Patient's diagnosis is consistent with persistent hiccups, cough, GERD.  Patient presented to emergency department complaining of incessant hiccups, intermittent cough x3 days.  Nonproductive in nature.  No other complaints.  Overall exam was reassuring.  Labs, imaging is grossly reassuring with no evidence of pneumonia on chest x-ray.  No obstruction in the abdomen.  Small hiatal hernia.  Patient was treated with baclofen, Reglan here in the emergency department with good improvement.  I also feel that there is likely a  GERD component to the patient's symptoms.  I will discharge the patient with omeprazole, baclofen, Reglan for symptom relief.  Return cautions discussed with the patient.  Follow-up primary care or GI as needed..  Patient is given ED precautions to return to the ED for any worsening or new symptoms.     ____________________________________________  FINAL CLINICAL IMPRESSION(S) / ED DIAGNOSES  Final diagnoses:  Hiccups  Gastroesophageal reflux disease, unspecified whether esophagitis present  Cough      NEW MEDICATIONS STARTED DURING THIS VISIT:  ED Discharge Orders         Ordered    omeprazole (PRILOSEC) 10 MG capsule  Daily     Discontinue  Reprint     06/24/20 1817    baclofen (LIORESAL) 10 MG tablet  3 times daily PRN     Discontinue  Reprint     06/24/20 1817    metoCLOPramide (REGLAN) 10 MG tablet  Every 8 hours PRN     Discontinue  Reprint     06/24/20 1817              This chart was dictated using voice recognition software/Dragon. Despite best efforts to proofread, errors can occur which can change the meaning. Any change was purely unintentional.    Racheal Patches, PA-C 06/24/20 1826    Chesley Noon, MD 06/24/20 [phone]

## 2020-06-24 NOTE — ED Notes (Signed)
Pt to CT

## 2020-06-24 NOTE — ED Notes (Signed)
St dry cough for 3 to 4 days and fever at home.

## 2020-06-24 NOTE — ED Triage Notes (Signed)
Pt comes into the ED via EMS from home, pt was told he either comes to the hospital or goes to jail. States he was told by GI that he has coughing to comes get checked.

## 2020-07-19 MED FILL — Diazepam Tab 5 MG: ORAL | Qty: 5 | Status: AC

## 2020-07-19 MED FILL — Enoxaparin Sodium Inj 40 MG/0.4ML: SUBCUTANEOUS | Qty: 0.4 | Status: AC

## 2020-07-19 MED FILL — Olanzapine Orally Disintegrating Tab 10 MG: ORAL | Qty: 10 | Status: AC

## 2020-07-19 MED FILL — Amoxicillin & K Clavulanate Tab 875-125 MG: ORAL | Qty: 1 | Status: AC

## 2020-07-19 MED FILL — Benztropine Mesylate Tab 0.5 MG: ORAL | Qty: 0.5 | Status: AC

## 2020-07-19 MED FILL — Chlorpromazine HCl Tab 10 MG: ORAL | Qty: 10 | Status: AC

## 2020-07-19 MED FILL — Clonidine HCl Tab 0.1 MG: ORAL | Qty: 0.1 | Status: AC

## 2020-07-19 MED FILL — Chlordiazepoxide HCl Cap 10 MG: ORAL | Qty: 50 | Status: AC

## 2021-01-24 ENCOUNTER — Emergency Department

## 2021-01-24 ENCOUNTER — Inpatient Hospital Stay
Admission: EM | Admit: 2021-01-24 | Discharge: 2021-01-26 | DRG: 896 | Attending: Internal Medicine | Admitting: Internal Medicine

## 2021-01-24 ENCOUNTER — Other Ambulatory Visit: Payer: Self-pay

## 2021-01-24 ENCOUNTER — Encounter: Payer: Self-pay | Admitting: Emergency Medicine

## 2021-01-24 DIAGNOSIS — Z781 Physical restraint status: Secondary | ICD-10-CM | POA: Diagnosis not present

## 2021-01-24 DIAGNOSIS — F10231 Alcohol dependence with withdrawal delirium: Secondary | ICD-10-CM | POA: Diagnosis present

## 2021-01-24 DIAGNOSIS — I10 Essential (primary) hypertension: Secondary | ICD-10-CM | POA: Diagnosis present

## 2021-01-24 DIAGNOSIS — F17210 Nicotine dependence, cigarettes, uncomplicated: Secondary | ICD-10-CM | POA: Diagnosis present

## 2021-01-24 DIAGNOSIS — Z20822 Contact with and (suspected) exposure to covid-19: Secondary | ICD-10-CM | POA: Diagnosis present

## 2021-01-24 DIAGNOSIS — K7210 Chronic hepatic failure without coma: Secondary | ICD-10-CM | POA: Diagnosis present

## 2021-01-24 DIAGNOSIS — Z7982 Long term (current) use of aspirin: Secondary | ICD-10-CM

## 2021-01-24 DIAGNOSIS — Z888 Allergy status to other drugs, medicaments and biological substances status: Secondary | ICD-10-CM

## 2021-01-24 DIAGNOSIS — S62307A Unspecified fracture of fifth metacarpal bone, left hand, initial encounter for closed fracture: Secondary | ICD-10-CM | POA: Diagnosis present

## 2021-01-24 DIAGNOSIS — F10232 Alcohol dependence with withdrawal with perceptual disturbance: Secondary | ICD-10-CM | POA: Diagnosis present

## 2021-01-24 DIAGNOSIS — I252 Old myocardial infarction: Secondary | ICD-10-CM

## 2021-01-24 DIAGNOSIS — Z951 Presence of aortocoronary bypass graft: Secondary | ICD-10-CM | POA: Insufficient documentation

## 2021-01-24 DIAGNOSIS — N179 Acute kidney failure, unspecified: Secondary | ICD-10-CM | POA: Diagnosis present

## 2021-01-24 DIAGNOSIS — D696 Thrombocytopenia, unspecified: Secondary | ICD-10-CM | POA: Diagnosis present

## 2021-01-24 DIAGNOSIS — E871 Hypo-osmolality and hyponatremia: Secondary | ICD-10-CM | POA: Diagnosis present

## 2021-01-24 DIAGNOSIS — S62337A Displaced fracture of neck of fifth metacarpal bone, left hand, initial encounter for closed fracture: Secondary | ICD-10-CM | POA: Diagnosis present

## 2021-01-24 DIAGNOSIS — S0101XA Laceration without foreign body of scalp, initial encounter: Secondary | ICD-10-CM | POA: Diagnosis present

## 2021-01-24 DIAGNOSIS — Z23 Encounter for immunization: Secondary | ICD-10-CM | POA: Diagnosis not present

## 2021-01-24 DIAGNOSIS — E876 Hypokalemia: Secondary | ICD-10-CM | POA: Diagnosis present

## 2021-01-24 DIAGNOSIS — R4182 Altered mental status, unspecified: Secondary | ICD-10-CM | POA: Insufficient documentation

## 2021-01-24 DIAGNOSIS — Z8249 Family history of ischemic heart disease and other diseases of the circulatory system: Secondary | ICD-10-CM

## 2021-01-24 DIAGNOSIS — D6959 Other secondary thrombocytopenia: Secondary | ICD-10-CM | POA: Diagnosis present

## 2021-01-24 DIAGNOSIS — S0990XA Unspecified injury of head, initial encounter: Secondary | ICD-10-CM

## 2021-01-24 DIAGNOSIS — W19XXXA Unspecified fall, initial encounter: Secondary | ICD-10-CM | POA: Diagnosis present

## 2021-01-24 DIAGNOSIS — G9341 Metabolic encephalopathy: Secondary | ICD-10-CM | POA: Diagnosis present

## 2021-01-24 DIAGNOSIS — E860 Dehydration: Secondary | ICD-10-CM | POA: Diagnosis present

## 2021-01-24 DIAGNOSIS — Z886 Allergy status to analgesic agent status: Secondary | ICD-10-CM

## 2021-01-24 DIAGNOSIS — I2510 Atherosclerotic heart disease of native coronary artery without angina pectoris: Secondary | ICD-10-CM | POA: Diagnosis present

## 2021-01-24 DIAGNOSIS — K7010 Alcoholic hepatitis without ascites: Secondary | ICD-10-CM | POA: Diagnosis present

## 2021-01-24 DIAGNOSIS — F10931 Alcohol use, unspecified with withdrawal delirium: Secondary | ICD-10-CM

## 2021-01-24 DIAGNOSIS — R509 Fever, unspecified: Secondary | ICD-10-CM | POA: Diagnosis present

## 2021-01-24 DIAGNOSIS — Y92149 Unspecified place in prison as the place of occurrence of the external cause: Secondary | ICD-10-CM | POA: Diagnosis not present

## 2021-01-24 DIAGNOSIS — F10932 Alcohol use, unspecified with withdrawal with perceptual disturbance: Secondary | ICD-10-CM

## 2021-01-24 DIAGNOSIS — R945 Abnormal results of liver function studies: Secondary | ICD-10-CM | POA: Diagnosis present

## 2021-01-24 DIAGNOSIS — R7989 Other specified abnormal findings of blood chemistry: Secondary | ICD-10-CM | POA: Diagnosis present

## 2021-01-24 DIAGNOSIS — Z79899 Other long term (current) drug therapy: Secondary | ICD-10-CM

## 2021-01-24 DIAGNOSIS — Z87898 Personal history of other specified conditions: Secondary | ICD-10-CM

## 2021-01-24 DIAGNOSIS — F1020 Alcohol dependence, uncomplicated: Secondary | ICD-10-CM | POA: Diagnosis present

## 2021-01-24 DIAGNOSIS — Z833 Family history of diabetes mellitus: Secondary | ICD-10-CM

## 2021-01-24 LAB — COMPREHENSIVE METABOLIC PANEL
ALT: 115 U/L — ABNORMAL HIGH (ref 0–44)
AST: 243 U/L — ABNORMAL HIGH (ref 15–41)
Albumin: 5.2 g/dL — ABNORMAL HIGH (ref 3.5–5.0)
Alkaline Phosphatase: 51 U/L (ref 38–126)
Anion gap: 14 (ref 5–15)
BUN: 32 mg/dL — ABNORMAL HIGH (ref 6–20)
CO2: 23 mmol/L (ref 22–32)
Calcium: 9.5 mg/dL (ref 8.9–10.3)
Chloride: 93 mmol/L — ABNORMAL LOW (ref 98–111)
Creatinine, Ser: 1.32 mg/dL — ABNORMAL HIGH (ref 0.61–1.24)
GFR, Estimated: >60 mL/min (ref >60)
Glucose, Bld: 101 mg/dL — ABNORMAL HIGH (ref 70–99)
Potassium: 2.6 mmol/L — CL (ref 3.5–5.1)
Sodium: 130 mmol/L — ABNORMAL LOW (ref 135–145)
Total Bilirubin: 2.5 mg/dL — ABNORMAL HIGH (ref 0.3–1.2)
Total Protein: 8.8 g/dL — ABNORMAL HIGH (ref 6.5–8.1)

## 2021-01-24 LAB — CBC
HCT: 34.2 % — ABNORMAL LOW (ref 39.0–52.0)
Hemoglobin: 11.8 g/dL — ABNORMAL LOW (ref 13.0–17.0)
MCH: 32 pg (ref 26.0–34.0)
MCHC: 34.5 g/dL (ref 30.0–36.0)
MCV: 92.7 fL (ref 80.0–100.0)
Platelets: 144 10*3/uL — ABNORMAL LOW (ref 150–400)
RBC: 3.69 MIL/uL — ABNORMAL LOW (ref 4.22–5.81)
RDW: 13.7 % (ref 11.5–15.5)
WBC: 9.3 10*3/uL (ref 4.0–10.5)
nRBC: 0 % (ref 0.0–0.2)

## 2021-01-24 LAB — MAGNESIUM: Magnesium: 1.9 mg/dL (ref 1.7–2.4)

## 2021-01-24 LAB — RESP PANEL BY RT-PCR (FLU A&B, COVID) ARPGX2
Influenza A by PCR: NEGATIVE
Influenza B by PCR: NEGATIVE
SARS Coronavirus 2 by RT PCR: NEGATIVE

## 2021-01-24 LAB — AMMONIA: Ammonia: 19 umol/L (ref 9–35)

## 2021-01-24 MED ORDER — LORAZEPAM 2 MG/ML IJ SOLN
2.0000 mg | Freq: Once | INTRAMUSCULAR | Status: AC
  Administered 2021-01-24: 2 mg via INTRAVENOUS
  Filled 2021-01-24: qty 1

## 2021-01-24 MED ORDER — LORAZEPAM 2 MG/ML IJ SOLN
1.0000 mg | INTRAMUSCULAR | Status: DC | PRN
  Administered 2021-01-25 (×2): 3 mg via INTRAVENOUS
  Filled 2021-01-24: qty 2

## 2021-01-24 MED ORDER — ONDANSETRON HCL 4 MG PO TABS: 4.0000 mg | ORAL_TABLET | Freq: Four times a day (QID) | ORAL | Status: DC | PRN

## 2021-01-24 MED ORDER — LORAZEPAM 2 MG/ML IJ SOLN: 0.0000 mg | Freq: Two times a day (BID) | INTRAMUSCULAR | Status: DC

## 2021-01-24 MED ORDER — SODIUM CHLORIDE 0.9 % IV BOLUS
1000.0000 mL | Freq: Once | INTRAVENOUS | Status: AC
  Administered 2021-01-24: 1000 mL via INTRAVENOUS

## 2021-01-24 MED ORDER — ACETAMINOPHEN 650 MG RE SUPP: 650.0000 mg | Freq: Four times a day (QID) | RECTAL | Status: DC | PRN

## 2021-01-24 MED ORDER — POTASSIUM CHLORIDE 20 MEQ PO PACK
80.0000 meq | PACK | Freq: Once | ORAL | Status: AC
  Administered 2021-01-24: 80 meq via ORAL
  Filled 2021-01-24: qty 4

## 2021-01-24 MED ORDER — THIAMINE HCL 100 MG PO TABS
100.0000 mg | ORAL_TABLET | Freq: Every day | ORAL | Status: DC
  Administered 2021-01-25: 100 mg via ORAL
  Filled 2021-01-24: qty 1

## 2021-01-24 MED ORDER — TETANUS-DIPHTH-ACELL PERTUSSIS 5-2.5-18.5 LF-MCG/0.5 IM SUSY
0.5000 mL | PREFILLED_SYRINGE | Freq: Once | INTRAMUSCULAR | Status: AC
  Administered 2021-01-24: 0.5 mL via INTRAMUSCULAR
  Filled 2021-01-24: qty 0.5

## 2021-01-24 MED ORDER — LIDOCAINE-EPINEPHRINE 2 %-1:100000 IJ SOLN
10.0000 mL | Freq: Once | INTRAMUSCULAR | Status: AC
  Administered 2021-01-24: 10 mL via INTRADERMAL
  Filled 2021-01-24: qty 1

## 2021-01-24 MED ORDER — ONDANSETRON HCL 4 MG/2ML IJ SOLN: 4.0000 mg | Freq: Four times a day (QID) | INTRAMUSCULAR | Status: DC | PRN

## 2021-01-24 MED ORDER — ENOXAPARIN SODIUM 40 MG/0.4ML ~~LOC~~ SOLN
40.0000 mg | SUBCUTANEOUS | Status: DC
  Administered 2021-01-24 – 2021-01-25 (×2): 40 mg via SUBCUTANEOUS
  Filled 2021-01-24 (×2): qty 0.4

## 2021-01-24 MED ORDER — THIAMINE HCL 100 MG/ML IJ SOLN: 100.0000 mg | Freq: Every day | INTRAMUSCULAR | Status: DC

## 2021-01-24 MED ORDER — ADULT MULTIVITAMIN W/MINERALS CH
1.0000 | ORAL_TABLET | Freq: Every day | ORAL | Status: DC
  Administered 2021-01-25 – 2021-01-26 (×2): 1 via ORAL
  Filled 2021-01-24 (×2): qty 1

## 2021-01-24 MED ORDER — SODIUM CHLORIDE 0.9 % IV SOLN: INTRAVENOUS | Status: DC

## 2021-01-24 MED ORDER — FOLIC ACID 1 MG PO TABS
1.0000 mg | ORAL_TABLET | Freq: Every day | ORAL | Status: DC
  Administered 2021-01-25 – 2021-01-26 (×2): 1 mg via ORAL
  Filled 2021-01-24 (×2): qty 1

## 2021-01-24 MED ORDER — LORAZEPAM 2 MG/ML IJ SOLN
0.0000 mg | Freq: Four times a day (QID) | INTRAMUSCULAR | Status: DC
  Administered 2021-01-24 – 2021-01-25 (×4): 2 mg via INTRAVENOUS
  Filled 2021-01-24: qty 1
  Filled 2021-01-24: qty 2
  Filled 2021-01-24 (×3): qty 1

## 2021-01-24 MED ORDER — ACETAMINOPHEN 325 MG PO TABS: 650.0000 mg | ORAL_TABLET | Freq: Four times a day (QID) | ORAL | Status: DC | PRN

## 2021-01-24 MED ORDER — LORAZEPAM 1 MG PO TABS
1.0000 mg | ORAL_TABLET | ORAL | Status: DC | PRN
Start: 2021-01-24 — End: 2021-01-26

## 2021-01-24 NOTE — ED Triage Notes (Addendum)
Pt with CO pt is from the jail. Pt states he fell backwards and hit his head around 4:00pm today. Per CO, laceration is about 2 inches long. Swelling is noted in his L hand. Pt is confused and per CO pt has been having hallucinations. Per CO, pt was speaking on a fake phone on the ride over here. Denies SI/HI. Pt is alert but confused, oriented to self and year but disoriented to situation Not answering all questions appropriately.
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
## 2021-01-24 NOTE — ED Notes (Signed)
Ulnar Gutter splint applied by this Clinical research associate and ED Nurse, mental health. No complaints of pain. Blood flow verified by capillary refill less than 3 seconds.

## 2021-01-24 NOTE — ED Notes (Addendum)
Patient having delusional thoughts. Patient unwrapping splint on left arm/wrist. This writer rewrapped splint. Patient started laying down and went to Pharmacologist. Officer at bedside stopped patient from Armed forces training and education officer.

## 2021-01-24 NOTE — Consult Note (Signed)
[facility] Face-to-Face Psychiatry Consult   Reason for Consult: Hallucinations and Fall Referring Physician: Dr. Vicente Males Patient Identification: Jason Atkins MRN:  [id number] Principal Diagnosis: <principal problem not specified> Diagnosis:  Active Problems:   Acute metabolic encephalopathy   Coronary artery disease   Alcohol withdrawal (HCC)   Fall   History of alcohol use disorder   AMS (altered mental status)   Scalp laceration, initial encounter  Total Time spent with patient: 20 minutes  Subjective: " I fell back and busted my had." Jason Atkins is a 50 y.o. male patient presented to [facility] ED via CO from jail voluntarily.  Per the ED triage nurse note, Pt with CO pt is from the jail. Pt states he fell backwards and hit his head around 4:00pm today. Per CO, laceration is about 2 inches long. Swelling is noted in his L hand. Pt is confused and per CO pt has been having hallucinations. Per CO, pt was speaking on a fake phone on the ride over here. Denies SI/HI. Pt is alert but confused, oriented to self and year but disoriented to situation Not answering all questions appropriately.   The patient discussed that he was in jail, fell backward, and busted his head open. The patient is in shackles and has a jail guard sitting at his bedside. The patient was seen face-to-face by this provider; the chart reviewed and consulted with Dr. Vicente Males that the patient can be reassessed while being followed by medicine on 01/24/2021 due to the patient's care. On evaluation, the patient is alert and oriented x 4, calm and cooperative, and mood-congruent with affect.  The patient does not appear to be responding to internal or external stimuli. He voiced that his wife was sitting outside his door. It's concerning if the patient is genuinely hallucinating or trying not to return to jail. The patient is not presenting with any delusional thinking. The patient denies auditory hallucinations. The patient denies any  suicidal, homicidal, or self-harm ideations. The patient is not presenting with any psychotic or paranoid behaviors. During an encounter with the patient, he answered questions appropriately.  Plan: The patient can be reassessed in the morning while being followed by medicine. HPI: Per Dr. Vicente Males, Jason Atkins is a 50 y.o. male with a past medical history of alcohol abuse, bipolar disorder, and CAD who presents for head trauma and left hand pain.  Patient has a laceration to the posterior scalp.  Also concern for patient having hallucinations as well enforcement officer who was with patient states he was "speaking on a fake phone" during transport.  Patient currently denies any pain other than the head and left hand.  Patient is unclear as to situation but otherwise is oriented to person place and time.  Patient currently denies any vision changes, tinnitus, difficulty speaking, facial droop, sore throat, chest pain, shortness of breath, abdominal pain, nausea/vomiting/diarrhea, dysuria, or weakness/numbness/paresthesias in any extremity  Past Psychiatric History:  Alcohol abuse  Bipolar disorder (HCC)  Risk to Self:  No Risk to Others:   No Prior Inpatient Therapy:   Yes Prior Outpatient Therapy:  Yes  Past Medical History:  Past Medical History:  Diagnosis Date  . Alcohol abuse   . Bipolar disorder (HCC)   . Coronary artery disease   . Heart attack (HCC)    x4  . Hx of CABG   . Hypertension   . Tobacco abuse     Past Surgical History:  Procedure Laterality Date  . CARDIAC SURGERY    .  CORONARY ARTERY BYPASS GRAFT     Family History:  Family History  Problem Relation Age of Onset  . Diabetes Mellitus II Father   . Heart attack Father    Family Psychiatric  History:  Social History:  Social History   Substance and Sexual Activity  Alcohol Use Yes  . Alcohol/week: 28.0 standard drinks  . Types: 28 Cans of beer per week     Social History   Substance and Sexual Activity   Drug Use No    Social History   Socioeconomic History  . Marital status: Married    Spouse name: Not on file  . Number of children: Not on file  . Years of education: Not on file  . Highest education level: Not on file  Occupational History  . Not on file  Tobacco Use  . Smoking status: Current Every Day Smoker    Packs/day: 0.50    Types: Cigarettes  . Smokeless tobacco: Never Used  Vaping Use  . Vaping Use: Never used  Substance and Sexual Activity  . Alcohol use: Yes    Alcohol/week: 28.0 standard drinks    Types: 28 Cans of beer per week  . Drug use: No  . Sexual activity: Not on file  Other Topics Concern  . Not on file  Social History Narrative  . Not on file   Social Determinants of Health   Financial Resource Strain: Not on file  Food Insecurity: Not on file  Transportation Needs: Not on file  Physical Activity: Not on file  Stress: Not on file  Social Connections: Not on file   Additional Social History:    Allergies:   Allergies  Allergen Reactions  . Almond Oil Other (See Comments)    Reaction: unknown  . Cashew Nut Oil Other (See Comments)    Reaction: unknown  . Ibuprofen Hives  . Benadryl [Diphenhydramine] Other (See Comments)    Unknown     Labs:  Results for orders placed or performed during the hospital encounter of 01/24/21 (from the past 48 hour(s))  Comprehensive metabolic panel     Status: Abnormal   Collection Time: 01/24/21  6:01 PM  Result Value Ref Range   Sodium 130 (L) 135 - 145 mmol/L   Potassium 2.6 (LL) 3.5 - 5.1 mmol/L    Comment: CRITICAL RESULT CALLED TO, READ BACK BY AND VERIFIED WITH ANNIE SMITH  ON 01/24/21 [facility]    Chloride 93 (L) 98 - 111 mmol/L   CO2 23 22 - 32 mmol/L   Glucose, Bld 101 (H) 70 - 99 mg/dL    Comment: Glucose reference range applies only to samples taken after fasting for at least 8 hours.   BUN 32 (H) 6 - 20 mg/dL   Creatinine, Ser 1.61 (H) 0.61 - 1.24 mg/dL   Calcium 9.5 8.9 - 09.6 mg/dL    Total Protein 8.8 (H) 6.5 - 8.1 g/dL   Albumin 5.2 (H) 3.5 - 5.0 g/dL   AST 045 (H) 15 - 41 U/L   ALT 115 (H) 0 - 44 U/L   Alkaline Phosphatase 51 38 - 126 U/L   Total Bilirubin 2.5 (H) 0.3 - 1.2 mg/dL   GFR, Estimated >40 >98 mL/min    Comment: (NOTE) Calculated using the CKD-EPI Creatinine Equation (2021)    Anion gap 14 5 - 15    Comment: Performed at [facility], [address], [place], [place] [address]  CBC     Status: Abnormal  Collection Time: 01/24/21  6:01 PM  Result Value Ref Range   WBC 9.3 4.0 - 10.5 K/uL   RBC 3.69 (L) 4.22 - 5.81 MIL/uL   Hemoglobin 11.8 (L) 13.0 - 17.0 g/dL   HCT 40.1 (L) 02.7 - 25.3 %   MCV 92.7 80.0 - 100.0 fL   MCH 32.0 26.0 - 34.0 pg   MCHC 34.5 30.0 - 36.0 g/dL   RDW 66.4 40.3 - 47.4 %   Platelets 144 (L) 150 - 400 K/uL   nRBC 0.0 0.0 - 0.2 %    Comment: Performed at [facility], [address] [place], [place] [address]  Ammonia     Status: None   Collection Time: 01/24/21  7:07 PM  Result Value Ref Range   Ammonia 19 9 - 35 umol/L    Comment: Performed at [facility], [address] [place], [place] [address]  Magnesium     Status: None   Collection Time: 01/24/21  7:07 PM  Result Value Ref Range   Magnesium 1.9 1.7 - 2.4 mg/dL    Comment: Performed at [facility], [address] [place], [place] [address]  Resp Panel by RT-PCR (Flu A&B, Covid) Nasopharyngeal Swab     Status: None   Collection Time: 01/24/21  8:28 PM   Specimen: Nasopharyngeal Swab; Nasopharyngeal(NP) swabs in vial transport medium  Result Value Ref Range   SARS Coronavirus 2 by RT PCR NEGATIVE NEGATIVE    Comment: (NOTE) SARS-CoV-2 target nucleic acids are NOT DETECTED.  The SARS-CoV-2 RNA is generally detectable in upper respiratory specimens during the acute phase of infection. The lowest concentration of SARS-CoV-2 viral copies this assay can detect is 138 copies/mL. A negative result does not preclude  SARS-Cov-2 infection and should not be used as the sole basis for treatment or other patient management decisions. A negative result may occur with  improper specimen collection/handling, submission of specimen other than nasopharyngeal swab, presence of viral mutation(s) within the areas targeted by this assay, and inadequate number of viral copies(<138 copies/mL). A negative result must be combined with clinical observations, patient history, and epidemiological information. The expected result is Negative.  Fact Sheet for Patients:  BloggerCourse.com  Fact Sheet for Healthcare Providers:  SeriousBroker.it  This test is no t yet approved or cleared by the [place] FDA and  has been authorized for detection and/or diagnosis of SARS-CoV-2 by FDA under an Emergency Use Authorization (EUA). This EUA will remain  in effect (meaning this test can be used) for the duration of the COVID-19 declaration under Section 564(b)(1) of the Act, 21 U.S.C.section 360bbb-3(b)(1), unless the authorization is terminated  or revoked sooner.       Influenza A by PCR NEGATIVE NEGATIVE   Influenza B by PCR NEGATIVE NEGATIVE    Comment: (NOTE) The Xpert Xpress SARS-CoV-2/FLU/RSV plus assay is intended as an aid in the diagnosis of influenza from Nasopharyngeal swab specimens and should not be used as a sole basis for treatment. Nasal washings and aspirates are unacceptable for Xpert Xpress SARS-CoV-2/FLU/RSV testing.  Fact Sheet for Patients: BloggerCourse.com  Fact Sheet for Healthcare Providers: SeriousBroker.it  This test is not yet approved or cleared by the [place] FDA and has been authorized for detection and/or diagnosis of SARS-CoV-2 by FDA under an Emergency Use Authorization (EUA). This EUA will remain in effect (meaning this test can be used) for the duration of the COVID-19  declaration under Section 564(b)(1) of the Act, 21 U.S.C.  section 360bbb-3(b)(1), unless the authorization is terminated or revoked.  Performed at [facility], [address] [place], [place] [address]     Current Facility-Administered Medications  Medication Dose Route Frequency Provider Last Rate Last Admin  . 0.9 %  sodium chloride infusion   Intravenous Continuous Lindajo Royal V, MD      . acetaminophen (TYLENOL) tablet 650 mg  650 mg Oral Q6H PRN Andris Baumann, MD       Or  . acetaminophen (TYLENOL) suppository 650 mg  650 mg Rectal Q6H PRN Andris Baumann, MD      . enoxaparin (LOVENOX) injection 40 mg  40 mg Subcutaneous Q24H Andris Baumann, MD      . Melene Muller ON 01/25/2021] folic acid (FOLVITE) tablet 1 mg  1 mg Oral Daily Lindajo Royal V, MD      . LORazepam (ATIVAN) injection 0-4 mg  0-4 mg Intravenous Q6H Andris Baumann, MD       Followed by  . [START ON 01/26/2021] LORazepam (ATIVAN) injection 0-4 mg  0-4 mg Intravenous Q12H Andris Baumann, MD      . LORazepam (ATIVAN) tablet 1-4 mg  1-4 mg Oral Q1H PRN Andris Baumann, MD       Or  . LORazepam (ATIVAN) injection 1-4 mg  1-4 mg Intravenous Q1H PRN Andris Baumann, MD      . Melene Muller ON 01/25/2021] multivitamin with minerals tablet 1 tablet  1 tablet Oral Daily Lindajo Royal V, MD      . ondansetron [facility]) tablet 4 mg  4 mg Oral Q6H PRN Andris Baumann, MD       Or  . ondansetron [facility]) injection 4 mg  4 mg Intravenous Q6H PRN Andris Baumann, MD      . Melene Muller ON 01/25/2021] thiamine tablet 100 mg  100 mg Oral Daily Andris Baumann, MD       Or  . Melene Muller ON 01/25/2021] thiamine (B-1) injection 100 mg  100 mg Intravenous Daily Andris Baumann, MD       Current Outpatient Medications  Medication Sig Dispense Refill  . aspirin EC 81 MG tablet Take 81 mg by mouth daily. Swallow whole.    . benztropine (COGENTIN) 0.5 MG tablet Take 1 tablet (0.5 mg total) by mouth 2 (two) times daily. 60 tablet 0  .  hydrochlorothiazide (HYDRODIURIL) 25 MG tablet Take 25 mg by mouth daily. (Patient not taking: Reported on 06/02/2020)    . metoCLOPramide (REGLAN) 10 MG tablet Take 1 tablet (10 mg total) by mouth every 8 (eight) hours as needed for up to 10 days (hiccups). 30 tablet 0  . Multiple Vitamin (MULTIVITAMIN WITH MINERALS) TABS tablet Take 1 tablet by mouth daily. (Patient not taking: Reported on 06/02/2020)    . OLANZapine zydis (ZYPREXA) 20 MG disintegrating tablet Take 1 tablet (20 mg total) by mouth at bedtime. 30 tablet 0  . omeprazole (PRILOSEC) 10 MG capsule Take 1 capsule (10 mg total) by mouth daily. 30 capsule 2    Musculoskeletal: Strength & Muscle Tone: within normal limits Gait & Station: normal Patient leans: Backward  Psychiatric Specialty Exam: Physical Exam Vitals and nursing note reviewed.  HENT:     Nose: Nose normal.  Eyes:     Conjunctiva/sclera: Conjunctivae normal.  Cardiovascular:     Rate and Rhythm: Tachycardia present.  Pulmonary:     Effort: Pulmonary effort is normal.  Musculoskeletal:  General: Normal range of motion.     Cervical back: Normal range of motion and neck supple.  Neurological:     Mental Status: He is alert and oriented to person, place, and time.  Psychiatric:        Attention and Perception: Attention normal. He perceives visual hallucinations.        Mood and Affect: Mood and affect normal.        Speech: Speech is tangential.        Behavior: Behavior normal. Behavior is cooperative.        Thought Content: Thought content is delusional.        Cognition and Memory: Cognition and memory normal.        Judgment: Judgment is inappropriate.     Review of Systems  Psychiatric/Behavioral: Positive for hallucinations.  All other systems reviewed and are negative.   Blood pressure 109/65, pulse (!) 121, temperature 99.1 F (37.3 C), temperature source Oral, resp. rate 20, height 5\' 11"  (1.803 m), weight 77.1 kg, SpO2 99 %.Body mass  index is 23.71 kg/m.  General Appearance: Bizarre  Eye Contact:  Good  Speech:  Clear   Volume:  Normal  Mood:  Euthymic  Affect:  Appropriate and Congruent  Thought Process:  Coherent  Orientation:  Full (Time, Place, and Person)  Thought Content:  Hallucinations: Visual and patient is acting like he is hallucinating. Not sure it is real her it is something he is doing to get him out of jail. and .  Suicidal Thoughts:  No  Homicidal Thoughts:  No  Memory:  Immediate;   Good Recent;   Good Remote;   Good  Judgement:  Fair  Insight:  Fair  Psychomotor Activity:  Normal  Concentration:  Concentration: Good and Attention Span: Good  Recall:  [employer]Fair  Fund of Knowledge:  Fair  Language:  Fair  Akathisia:  Negative  Handed:  Right  AIMS (if indicated):     Assets:  Communication Skills Desire for Improvement Physical Health Resilience Social Support  ADL's:  Intact  Cognition:  WNL  Sleep:        Treatment Plan Summary: Plan The patient is going to be admitted to the medical floor.   Disposition: No evidence of imminent risk to self or others at present.   Supportive therapy provided about ongoing stressors. The patient can be reassessed in the morning while being followed by medicine.   Gillermo MurdochJacqueline , NP 01/24/2021 11:34 PM

## 2021-01-24 NOTE — H&P (Signed)
History and Physical    Jason Atkins PRF:[id number] DOB: Apr 17, 1971 DOA: 01/24/2021  PCP: [facility]   [facility] jail  I have personally briefly reviewed patient's old medical records in [facility]  Chief Complaint: Fall, confusion, hallucinations  HPI: Jason Atkins is a 50 y.o. male with medical history significant for CAD status post CABG, HTN, severe alcohol use disorder, hospitalized for 2 weeks in August 2021 with severe alcohol withdrawal requiring IV sedation and intubation, incarcerated for the past 4 days who presents following a fall sustaining a laceration to the back of his head and injury to his left hand.  There was no reported shaking or seizure-like activity, no reported loss of consciousness, urinary incontinence or tongue biting.  In route to the hospital he was apparently hallucinating, speaking on a nonexistent cell phone.  History is limited due to confusion. ED course: On arrival, tachycardic at 121 with low-grade temperature of 99.1, BP 109/65, respirations 20 with O2 sat 99% on room air.  Blood work significant for normal WBC of 9.3, hemoglobin 11.8 which is his baseline.  Several electrolyte abnormalities including sodium 130, potassium 2.6, creatinine 1.32 up from baseline of 0.68.  Elevated abnormal LFTs to include AST 243, ALT 115 and total bilirubin 2.5 ammonia level 19.  UDS and UA pending.  Covid and flu negative. EKG: Pending from the ED Imaging: CT head with left parietal scalp hematoma and laceration with no acute intracranial process Left hand x-ray showing mildly displaced and angulated distal fifth metacarpal fracture associated with soft tissue edema  Patient had laceration repair, given a dose of IV Ativan, IV fluid bolus, and given oral potassium replacement.  ED provider states that psychiatry was consulted and thought that symptoms were mostly medical.  Hospitalist consulted for admission.    Review of Systems:  Unreliable due to confusion   Past Medical History:  Diagnosis Date  . Alcohol abuse   . Bipolar disorder (HCC)   . Coronary artery disease   . Heart attack (HCC)    x4  . Hx of CABG   . Hypertension   . Tobacco abuse     Past Surgical History:  Procedure Laterality Date  . CARDIAC SURGERY    . CORONARY ARTERY BYPASS GRAFT       reports that he has been smoking cigarettes. He has been smoking about 0.50 packs per day. He has never used smokeless tobacco. He reports current alcohol use of about 28.0 standard drinks of alcohol per week. He reports that he does not use drugs.  Allergies  Allergen Reactions  . Almond Oil Other (See Comments)    Reaction: unknown  . Cashew Nut Oil Other (See Comments)    Reaction: unknown  . Ibuprofen Hives  . Benadryl [Diphenhydramine] Other (See Comments)    Unknown     Family History  Problem Relation Age of Onset  . Diabetes Mellitus II Father   . Heart attack Father       Prior to Admission medications   Medication Sig Start Date End Date Taking? Authorizing Provider  aspirin EC 81 MG tablet Take 81 mg by mouth daily. Swallow whole.    [provider]  benztropine (COGENTIN) 0.5 MG tablet Take 1 tablet (0.5 mg total) by mouth 2 (two) times daily. 06/22/20 07/22/20  Charise Killian, MD  hydrochlorothiazide (HYDRODIURIL) 25 MG tablet Take 25 mg by mouth daily. Patient not taking: Reported on 06/02/2020    [provider]  metoCLOPramide (REGLAN) 10 MG tablet Take 1 tablet (10 mg total) by mouth every 8 (eight) hours as needed for up to 10 days (hiccups). 06/24/20 07/04/20  Cuthriell, Delorise Royals, PA-C  Multiple Vitamin (MULTIVITAMIN WITH MINERALS) TABS tablet Take 1 tablet by mouth daily. Patient not taking: Reported on 06/02/2020    [provider]  OLANZapine zydis (ZYPREXA) 20 MG disintegrating tablet Take 1 tablet (20 mg total) by mouth at bedtime. 06/22/20 07/22/20  Charise Killian, MD  omeprazole (PRILOSEC)  10 MG capsule Take 1 capsule (10 mg total) by mouth daily. 06/24/20   Cuthriell, Delorise Royals, PA-C    Physical Exam: Vitals:   01/24/21 1802 01/24/21 1803  BP: 109/65   Pulse: (!) 121   Resp: 20   Temp: 99.1 F (37.3 C)   TempSrc: Oral   SpO2: 99%   Weight:  77.1 kg  Height:  5\' 11"  (1.803 m)     Vitals:   01/24/21 1802 01/24/21 1803  BP: 109/65   Pulse: (!) 121   Resp: 20   Temp: 99.1 F (37.3 C)   TempSrc: Oral   SpO2: 99%   Weight:  77.1 kg  Height:  5\' 11"  (1.803 m)      Constitutional: Alert , butconfused, hallucinating, saying things that do not make sense and not answering questions asked , unable to assess orientation. Restless and tremulous HEENT:      Head: Normocephalic . Left parietal laceration,          Eyes: PERLA, EOMI, Conjunctivae are normal. Sclera is non-icteric.       Mouth/Throat: Mucous membranes are moist.       Neck: Supple with no signs of meningismus. Cardiovascular: Regular rate and rhythm. No murmurs, gallops, or rubs. 2+ symmetrical distal pulses are present . No JVD. No LE edema Respiratory: Respiratory effort normal .Lungs sounds clear bilaterally. No wheezes, crackles, or rhonchi.  Gastrointestinal: Soft, non tender, and non distended with positive bowel sounds.  Genitourinary: No CVA tenderness. Musculoskeletal: Nontender with normal range of motion in all extremities. Left hand in splint. No cyanosis, or erythema of extremities. Neurologic:  Face is symmetric. Moving all extremities. No gross focal neurologic deficits . Skin: Skin is warm, dry.  No rash or ulcers Psychiatric: Anxious , restless and agitated    Labs on Admission: I have personally reviewed following labs and imaging studies  CBC: Recent Labs  Lab 01/24/21 1801  WBC 9.3  HGB 11.8*  HCT 34.2*  MCV 92.7  PLT 144*   Basic Metabolic Panel: Recent Labs  Lab 01/24/21 1801  NA 130*  K 2.6*  CL 93*  CO2 23  GLUCOSE 101*  BUN 32*  CREATININE 1.32*  CALCIUM  9.5   GFR: Estimated Creatinine Clearance: 72.1 mL/min (A) (by C-G formula based on SCr of 1.32 mg/dL (H)). Liver Function Tests: Recent Labs  Lab 01/24/21 1801  AST 243*  ALT 115*  ALKPHOS 51  BILITOT 2.5*  PROT 8.8*  ALBUMIN 5.2*   No results for input(s): LIPASE, AMYLASE in the last 168 hours. Recent Labs  Lab 01/24/21 1907  AMMONIA 19   Coagulation Profile: No results for input(s): INR, PROTIME in the last 168 hours. Cardiac Enzymes: No results for input(s): CKTOTAL, CKMB, CKMBINDEX, TROPONINI in the last 168 hours. BNP (last 3 results) No results for input(s): PROBNP in the last 8760 hours. HbA1C: No results for input(s): HGBA1C in the last 72 hours. CBG: No results for input(s): GLUCAP in the  last 168 hours. Lipid Profile: No results for input(s): CHOL, HDL, LDLCALC, TRIG, CHOLHDL, LDLDIRECT in the last 72 hours. Thyroid Function Tests: No results for input(s): TSH, T4TOTAL, FREET4, T3FREE, THYROIDAB in the last 72 hours. Anemia Panel: No results for input(s): VITAMINB12, FOLATE, FERRITIN, TIBC, IRON, RETICCTPCT in the last 72 hours. Urine analysis:    Component Value Date/Time   COLORURINE YELLOW (A) 06/07/2020 1459   APPEARANCEUR HAZY (A) 06/07/2020 1459   APPEARANCEUR Clear 01/08/2014 1537   LABSPEC 1.018 06/07/2020 1459   LABSPEC 1.003 01/08/2014 1537   PHURINE 5.0 06/07/2020 1459   GLUCOSEU NEGATIVE 06/07/2020 1459   GLUCOSEU 50 mg/dL 16/10/960402/19/2015 [id number]   HGBUR NEGATIVE 06/07/2020 1459   BILIRUBINUR NEGATIVE 06/07/2020 1459   BILIRUBINUR Negative 01/08/2014 1537   KETONESUR NEGATIVE 06/07/2020 1459   PROTEINUR NEGATIVE 06/07/2020 1459   NITRITE NEGATIVE 06/07/2020 1459   LEUKOCYTESUR NEGATIVE 06/07/2020 1459   LEUKOCYTESUR Negative 01/08/2014 1537    Radiological Exams on Admission: CT Head Wo Contrast  Result Date: 01/24/2021 CLINICAL DATA:  Larey SeatFell and hit head, laceration, confusion EXAM: CT HEAD WITHOUT CONTRAST TECHNIQUE: Contiguous axial images  were obtained from the base of the skull through the vertex without intravenous contrast. COMPARISON:  06/03/2020 FINDINGS: Brain: No acute infarct or hemorrhage. Lateral ventricles and midline structures are unremarkable. No acute extra-axial fluid collections. No mass effect. Vascular: No hyperdense vessel or unexpected calcification. Skull: Normal. Negative for fracture or focal lesion. Sinuses/Orbits: Minimal mucosal thickening right ethmoid air cells. Remaining sinuses are clear. Other: Focal soft tissue edema and laceration within the left parietal scalp along the convexity. IMPRESSION: 1. Left parietal scalp hematoma and laceration. 2. No acute intracranial process. Electronically Signed   By: Sharlet SalinaMichael  Chiamaka Latka M.D.   On: 01/24/2021 18:53   DG Hand Complete Left  Result Date: 01/24/2021 CLINICAL DATA:  Swelling. EXAM: LEFT HAND - COMPLETE 3+ VIEW COMPARISON:  None. FINDINGS: Mildly displaced and angulated distal fifth metacarpal fracture. No intra-articular extension. No other fracture of the hand. No a joint spaces and alignment. Dorsal soft tissue edema overlies the metacarpals. Tiny well corticated osseous density adjacent to the ulna styloid may represent sequela of remote prior injury or accessory ossicle. IMPRESSION: Mildly displaced and angulated distal fifth metacarpal fracture. Associated soft tissue edema. Electronically Signed   By: Narda RutherfordMelanie  Sanford M.D.   On: 01/24/2021 18:36     Assessment/Plan 50 year old male with history of CAD status post CABG, HTN, severe alcohol use disorder, hospitalized for 2 weeks in August 2021 with severe alcohol withdrawal requiring IV sedation and intubation, incarcerated for the past 4 days who presents with confusion, hallucinations and an unwitnessed fall sustaining a laceration to left scalp and injury to his left hand.      Acute metabolic encephalopathy -Suspect multifactorial and related to alcohol withdrawal and electrolyte abnormality.  -Ammonia  level 19, hepatic encephalopathy not suspected -Neurologic checks -Fall seizure and aspiration precautions -Follow urine drug screen -Patient was evaluated by psychiatry while in the emergency room and confusion thought more medical in nature, pending official note    Acute alcohol withdrawal delirium, hyperactive(HCC)   History of severe alcohol use disorder -Patient presents with confusion and hallucinations in the setting of history of alcohol use disorder, recent incarceration and with history of severe withdrawal in the past requiring intubation -Patient is tachycardic, low-grade temperature -CIWA withdrawal protocol -Thiamine folate and multivitamin -Monitor for worsening    Abnormal LFTs   -Likely related to alcohol use -abnormal LFTs to include  AST 243, ALT 115 and total bilirubin 2.5 ammonia level 19 -If no improvement, consider RUQ sonogram, hepatitis panel    AKI (acute kidney injury) (HCC)  -Creatinine 1.32, up from baseline of 0.68 -IV hydration, monitor renal function and avoid nephrotoxins    Hyponatremia -Likely related to alcohol use, and suspect hypovolemic -IV hydration with normal saline and monitor    Hypokalemia -Possibly related to HCTZ seen on med list -IV and oral repletion and monitor -Cardiac monitoring  Thrombocytopenia -Mild with platelet count 244,000 but down from usual thrombocytosis in the 4-500,000s -Likely related to alcohol use    Coronary artery disease   Hx of CABG -No complaints of chest pain. -Follow-up echocardiogram -Continue aspirin pending med rec.  Old medication list does not include beta-blocker or statin    Hypertension -Continue HCTZ pending med rec though this might be contributing to hypokalemia -Consider substituting another antihypertensive for HCTZ    Fall, with injury -Circumstances of fall uncertain, seizure not ruled out -Fall and seizure precautions (see acute metabolic encephalopathy)    Closed fracture of  fifth metacarpal bone of left hand   Scalp laceration, initial encounter -Patient had laceration repaired in the emergency room -Wound care -Left hand splinted due to the fifth metacarpal fracture -Orthopedic follow-up for fracture -"Tdap in the ER    DVT prophylaxis: Lovenox  Code Status: full code  Family Communication:  none  Disposition Plan: Back to previous home environment Consults called: none  Status:At the time of admission, it appears that the appropriate admission status for this patient is INPATIENT. This is judged to be reasonable and necessary in order to provide the required intensity of service to ensure the patient's safety given the presenting symptoms, physical exam findings, and initial radiographic and laboratory data in the context of their  Comorbid conditions.   Patient requires inpatient status due to high intensity of service, high risk for further deterioration and high frequency of surveillance required.   I certify that at the point of admission it is my clinical judgment that the patient will require inpatient hospital care spanning beyond 2 midnights     Andris Baumann MD [facility]     01/24/2021, 10:15 PM

## 2021-01-25 DIAGNOSIS — F10231 Alcohol dependence with withdrawal delirium: Secondary | ICD-10-CM | POA: Diagnosis not present

## 2021-01-25 LAB — CBC
HCT: 31.8 % — ABNORMAL LOW (ref 39.0–52.0)
Hemoglobin: 10.9 g/dL — ABNORMAL LOW (ref 13.0–17.0)
MCH: 32.2 pg (ref 26.0–34.0)
MCHC: 34.3 g/dL (ref 30.0–36.0)
MCV: 94.1 fL (ref 80.0–100.0)
Platelets: 123 10*3/uL — ABNORMAL LOW (ref 150–400)
RBC: 3.38 MIL/uL — ABNORMAL LOW (ref 4.22–5.81)
RDW: 13.9 % (ref 11.5–15.5)
WBC: 8.6 10*3/uL (ref 4.0–10.5)
nRBC: 0 % (ref 0.0–0.2)

## 2021-01-25 LAB — COMPREHENSIVE METABOLIC PANEL
ALT: 89 U/L — ABNORMAL HIGH (ref 0–44)
AST: 166 U/L — ABNORMAL HIGH (ref 15–41)
Albumin: 4.3 g/dL (ref 3.5–5.0)
Alkaline Phosphatase: 49 U/L (ref 38–126)
Anion gap: 12 (ref 5–15)
BUN: 19 mg/dL (ref 6–20)
CO2: 23 mmol/L (ref 22–32)
Calcium: 8.9 mg/dL (ref 8.9–10.3)
Chloride: 101 mmol/L (ref 98–111)
Creatinine, Ser: 0.68 mg/dL (ref 0.61–1.24)
GFR, Estimated: >60 mL/min (ref >60)
Glucose, Bld: 75 mg/dL (ref 70–99)
Potassium: 2.9 mmol/L — ABNORMAL LOW (ref 3.5–5.1)
Sodium: 136 mmol/L (ref 135–145)
Total Bilirubin: 2.4 mg/dL — ABNORMAL HIGH (ref 0.3–1.2)
Total Protein: 7.5 g/dL (ref 6.5–8.1)

## 2021-01-25 MED ORDER — THIAMINE HCL 100 MG/ML IJ SOLN
500.0000 mg | Freq: Three times a day (TID) | INTRAVENOUS | Status: DC
  Administered 2021-01-25 – 2021-01-26 (×3): 500 mg via INTRAVENOUS
  Filled 2021-01-25 (×6): qty 5

## 2021-01-25 MED ORDER — OLANZAPINE 10 MG PO TABS
10.0000 mg | ORAL_TABLET | Freq: Every day | ORAL | Status: DC
  Administered 2021-01-25: 10 mg via ORAL
  Filled 2021-01-25 (×2): qty 1

## 2021-01-25 MED ORDER — POTASSIUM CHLORIDE 2 MEQ/ML IV SOLN
INTRAVENOUS | Status: DC
  Filled 2021-01-25 (×5): qty 1000

## 2021-01-25 MED ORDER — POTASSIUM CHLORIDE CRYS ER 20 MEQ PO TBCR
40.0000 meq | EXTENDED_RELEASE_TABLET | Freq: Two times a day (BID) | ORAL | Status: DC
  Administered 2021-01-25 – 2021-01-26 (×3): 40 meq via ORAL
  Filled 2021-01-25 (×3): qty 2

## 2021-01-25 MED ORDER — MAGNESIUM OXIDE 400 (241.3 MG) MG PO TABS
800.0000 mg | ORAL_TABLET | Freq: Every day | ORAL | Status: DC
Start: 2021-01-25 — End: 2021-01-26
  Administered 2021-01-25 – 2021-01-26 (×2): 800 mg via ORAL
  Filled 2021-01-25 (×2): qty 2

## 2021-01-25 NOTE — Progress Notes (Signed)
PROGRESS NOTE    Jason Atkins  HUT:[id number] DOB: Jul 21, 1971 DOA: 01/24/2021 PCP: [facility]    Brief Narrative:  50 year old gentleman with history of coronary artery disease status post CABG, hypertension, alcohol use disorder, previous history of severe alcohol withdrawal requiring IV sedation and intubation who is incarcerated since Friday brought to the ER with hallucination, fell on his back of the head.  On arrival to ER he was tachycardic with heart rate of 121, low-grade temperature 99.1.  Blood pressure stable.  On room air.  Potassium 2.6.  Creatinine 1.32.  Mildly abnormal LFTs.  Covid and flu negative.  Skeletal survey negative except left distal fifth metacarpal fracture.  Admitted with severe alcohol withdrawal.   Assessment & Plan:   Principal Problem:   Alcohol withdrawal delirium, acute, hyperactive (HCC) Active Problems:   Coronary artery disease   Thrombocytopenia (HCC)   Abnormal LFTs   Fall   Alcohol use disorder, severe, dependence (HCC)   Hyponatremia   Hypokalemia   History of alcohol use disorder   Closed fracture of fifth metacarpal bone of left hand   Scalp laceration, initial encounter  Acute metabolic encephalopathy secondary to acute alcohol withdrawal delirium with underlying history of alcohol induced delirium: Very high risk of complications from alcohol withdrawal.  Day 4 of abstinence after getting into jail. All-time fall precautions.  Delirium precautions. Patient on high-dose benzodiazepines with CIWA scale. Patient is already on restraints from correctional office, use restraints to prevent fall and injuries as needed. Patient with chronic alcoholism, will treat with high-dose thiamine 500 mg 3 times a day for 5 days and then maintenance thiamine. Appreciate psychiatry help.  Previously Zyprexa was helpful for mood stabilization, will resume 10 mg at night.  Electrolyte abnormalities: Replace aggressively.  Potassium and  magnesium replacement and monitor levels.  Acute kidney injury: Secondary to dehydration.  Resolved.  Continue IV fluid today.  Chronic liver disease with thrombocytopenia: Monitor.  Fairly at baseline.  Closed fracture of the fifth metacarpal bone of the left hand for conservative management.  Scalp laceration for conservative management.  Called and updated patient's wife.  According to the patient's wife, he drinks about 10 beers a day which is about one quarter of his usual drinks.  Last drink was Thursday before he was arrested from the court.  She also explains that he has baseline dementia-like symptoms, he does not remember things and this is going on for the last 6 months or more. Wife explains that since his last discharge from the hospital, he has never been able to be seen by a psychiatrist for follow-up.   DVT prophylaxis: enoxaparin (LOVENOX) injection 40 mg Start: 01/24/21 2300   Code Status: Full code Family Communication: Wife on the phone Disposition Plan: Status is: Inpatient  Remains inpatient appropriate because:IV treatments appropriate due to intensity of illness or inability to take PO and Inpatient level of care appropriate due to severity of illness   Dispo: The patient is from: [place]               Anticipated d/c is to: Unknown              Patient currently is not medically stable to d/c.   Difficult to place patient No         Consultants:   Psychiatry  Procedures:   None  Antimicrobials:   None   Subjective: Patient seen and examined.  The emergency room.  He had just received Ativan  and was snoring. Patient uttered some incomprehensible words on his stimulation but did not wake up for conversation.  Objective: Vitals:   01/25/21 0500 01/25/21 0830 01/25/21 0944 01/25/21 0945  BP: (!) 166/89 139/85 (!) 165/104 (!) 165/104  Pulse: 96 88 96 98  Resp: 19 18 18  (!) 23  Temp:      TempSrc:      SpO2: 100% 100% 100% 100%  Weight:       Height:       No intake or output data in the 24 hours ending 01/25/21 1035 Filed Weights   01/24/21 1803  Weight: 77.1 kg    Examination:  General exam: Appears anxious, tremulous.  Chronically sick looking. Respiratory system: Clear to auscultation. Respiratory effort normal.  No added sounds. Cardiovascular system: S1 & S2 heard, tachycardic.   Gastrointestinal system: Abdomen is nondistended, soft and nontender. No organomegaly or masses felt. Normal bowel sounds heard. Central nervous system: Moving all extremities. Patient was sleepy, did not participate in conversation. Nursing staff reported that he thinks his wife is outside the room. Tenderness on the left hand.    Data Reviewed: I have personally reviewed following labs and imaging studies  CBC: Recent Labs  Lab 01/24/21 1801 01/25/21 0448  WBC 9.3 8.6  HGB 11.8* 10.9*  HCT 34.2* 31.8*  MCV 92.7 94.1  PLT 144* 123*   Basic Metabolic Panel: Recent Labs  Lab 01/24/21 1801 01/24/21 1907 01/25/21 0448  NA 130*  --  136  K 2.6*  --  2.9*  CL 93*  --  101  CO2 23  --  23  GLUCOSE 101*  --  75  BUN 32*  --  19  CREATININE 1.32*  --  0.68  CALCIUM 9.5  --  8.9  MG  --  1.9  --    GFR: Estimated Creatinine Clearance: 119 mL/min (by C-G formula based on SCr of 0.68 mg/dL). Liver Function Tests: Recent Labs  Lab 01/24/21 1801 01/25/21 0448  AST 243* 166*  ALT 115* 89*  ALKPHOS 51 49  BILITOT 2.5* 2.4*  PROT 8.8* 7.5  ALBUMIN 5.2* 4.3   No results for input(s): LIPASE, AMYLASE in the last 168 hours. Recent Labs  Lab 01/24/21 1907  AMMONIA 19   Coagulation Profile: No results for input(s): INR, PROTIME in the last 168 hours. Cardiac Enzymes: No results for input(s): CKTOTAL, CKMB, CKMBINDEX, TROPONINI in the last 168 hours. BNP (last 3 results) No results for input(s): PROBNP in the last 8760 hours. HbA1C: No results for input(s): HGBA1C in the last 72 hours. CBG: No results for input(s):  GLUCAP in the last 168 hours. Lipid Profile: No results for input(s): CHOL, HDL, LDLCALC, TRIG, CHOLHDL, LDLDIRECT in the last 72 hours. Thyroid Function Tests: No results for input(s): TSH, T4TOTAL, FREET4, T3FREE, THYROIDAB in the last 72 hours. Anemia Panel: No results for input(s): VITAMINB12, FOLATE, FERRITIN, TIBC, IRON, RETICCTPCT in the last 72 hours. Sepsis Labs: No results for input(s): PROCALCITON, LATICACIDVEN in the last 168 hours.  Recent Results (from the past 240 hour(s))  Resp Panel by RT-PCR (Flu A&B, Covid) Nasopharyngeal Swab     Status: None   Collection Time: 01/24/21  8:28 PM   Specimen: Nasopharyngeal Swab; Nasopharyngeal(NP) swabs in vial transport medium  Result Value Ref Range Status   SARS Coronavirus 2 by RT PCR NEGATIVE NEGATIVE Final    Comment: (NOTE) SARS-CoV-2 target nucleic acids are NOT DETECTED.  The SARS-CoV-2 RNA is generally detectable in  upper respiratory specimens during the acute phase of infection. The lowest concentration of SARS-CoV-2 viral copies this assay can detect is 138 copies/mL. A negative result does not preclude SARS-Cov-2 infection and should not be used as the sole basis for treatment or other patient management decisions. A negative result may occur with  improper specimen collection/handling, submission of specimen other than nasopharyngeal swab, presence of viral mutation(s) within the areas targeted by this assay, and inadequate number of viral copies(<138 copies/mL). A negative result must be combined with clinical observations, patient history, and epidemiological information. The expected result is Negative.  Fact Sheet for Patients:  BloggerCourse.comhttps://www.fda.gov/media/152166/download  Fact Sheet for Healthcare Providers:  SeriousBroker.ithttps://www.fda.gov/media/152162/download  This test is no t yet approved or cleared by the [place] FDA and  has been authorized for detection and/or diagnosis of SARS-CoV-2 by FDA under an  Emergency Use Authorization (EUA). This EUA will remain  in effect (meaning this test can be used) for the duration of the COVID-19 declaration under Section 564(b)(1) of the Act, 21 U.S.C.section 360bbb-3(b)(1), unless the authorization is terminated  or revoked sooner.       Influenza A by PCR NEGATIVE NEGATIVE Final   Influenza B by PCR NEGATIVE NEGATIVE Final    Comment: (NOTE) The Xpert Xpress SARS-CoV-2/FLU/RSV plus assay is intended as an aid in the diagnosis of influenza from Nasopharyngeal swab specimens and should not be used as a sole basis for treatment. Nasal washings and aspirates are unacceptable for Xpert Xpress SARS-CoV-2/FLU/RSV testing.  Fact Sheet for Patients: BloggerCourse.comhttps://www.fda.gov/media/152166/download  Fact Sheet for Healthcare Providers: SeriousBroker.ithttps://www.fda.gov/media/152162/download  This test is not yet approved or cleared by the [place] FDA and has been authorized for detection and/or diagnosis of SARS-CoV-2 by FDA under an Emergency Use Authorization (EUA). This EUA will remain in effect (meaning this test can be used) for the duration of the COVID-19 declaration under Section 564(b)(1) of the Act, 21 U.S.C. section 360bbb-3(b)(1), unless the authorization is terminated or revoked.  Performed at [facility], [address] [place], [place] [id number]          Radiology Studies: CT Head Wo Contrast  Result Date: 01/24/2021 CLINICAL DATA:  Larey SeatFell and hit head, laceration, confusion EXAM: CT HEAD WITHOUT CONTRAST TECHNIQUE: Contiguous axial images were obtained from the base of the skull through the vertex without intravenous contrast. COMPARISON:  06/03/2020 FINDINGS: Brain: No acute infarct or hemorrhage. Lateral ventricles and midline structures are unremarkable. No acute extra-axial fluid collections. No mass effect. Vascular: No hyperdense vessel or unexpected calcification. Skull: Normal. Negative for fracture or focal lesion.  Sinuses/Orbits: Minimal mucosal thickening right ethmoid air cells. Remaining sinuses are clear. Other: Focal soft tissue edema and laceration within the left parietal scalp along the convexity. IMPRESSION: 1. Left parietal scalp hematoma and laceration. 2. No acute intracranial process. Electronically Signed   By: Sharlet SalinaMichael  Brown M.D.   On: 01/24/2021 18:53   DG Hand Complete Left  Result Date: 01/24/2021 CLINICAL DATA:  Swelling. EXAM: LEFT HAND - COMPLETE 3+ VIEW COMPARISON:  None. FINDINGS: Mildly displaced and angulated distal fifth metacarpal fracture. No intra-articular extension. No other fracture of the hand. No a joint spaces and alignment. Dorsal soft tissue edema overlies the metacarpals. Tiny well corticated osseous density adjacent to the ulna styloid may represent sequela of remote prior injury or accessory ossicle. IMPRESSION: Mildly displaced and angulated distal fifth metacarpal fracture. Associated soft tissue edema. Electronically Signed   By: Narda RutherfordMelanie  Sanford M.D.   On: 01/24/2021 18:36  Scheduled Meds: . enoxaparin (LOVENOX) injection  40 mg Subcutaneous Q24H  . folic acid  1 mg Oral Daily  . LORazepam  0-4 mg Intravenous Q6H   Followed by  . [START ON 01/26/2021] LORazepam  0-4 mg Intravenous Q12H  . magnesium oxide  800 mg Oral Daily  . multivitamin with minerals  1 tablet Oral Daily  . OLANZapine  10 mg Oral QHS  . potassium chloride  40 mEq Oral BID   Continuous Infusions: . sodium chloride 125 mL/hr at 01/25/21 0758  . thiamine injection       LOS: 1 day    Time spent: 35 minutes    Dorcas Carrow, MD [facility] Pager [phone]

## 2021-01-25 NOTE — Progress Notes (Signed)
sheriff at bed side with pt

## 2021-01-25 NOTE — ED Notes (Signed)
Pt resting in bed, sheriffs dept at bedside. Pt connected to cardiac monitor/pulse ox/bp

## 2021-01-25 NOTE — ED Notes (Signed)
Male suction catheter placed on pt at this time

## 2021-01-25 NOTE — ED Notes (Addendum)
Dr. Dorcas Carrow MD, notified of potential run of v fib/ tach at 1002. Strips printed and placed with pt chart at 1030. MD reviewed printed monitor strip and reports not v fib/tach at this time

## 2021-01-25 NOTE — ED Notes (Signed)
Forensic wrist restraints removed at this time for pt to eat. Pt repositioned in bed and attempting to eat sandwich. Pt hallucinating moving hands to mouth and attempting to eat food that is not in his hands.

## 2021-01-25 NOTE — ED Notes (Signed)
Officer called out, patient peed in the bed. Cleaned up with assistance. Linens changed and new brief and external catheter applied.

## 2021-01-25 NOTE — Consult Note (Signed)
[facility] Face-to-Face Psychiatry Consult   Reason for Consult: Consult for this 50 year old man with alcohol abuse now having DTs. Referring Physician:  Ghimire Patient Identification: Jason Atkins MRN:  [id number] Principal Diagnosis: Alcohol withdrawal delirium, acute, hyperactive (HCC) Diagnosis:  Principal Problem:   Alcohol withdrawal delirium, acute, hyperactive (HCC) Active Problems:   Coronary artery disease   Thrombocytopenia (HCC)   Abnormal LFTs   Fall   Alcohol use disorder, severe, dependence (HCC)   Hyponatremia   Hypokalemia   History of alcohol use disorder   Closed fracture of fifth metacarpal bone of left hand   Scalp laceration, initial encounter   Total Time spent with patient: 1 hour  Subjective:   Jason Atkins is a 50 y.o. male patient admitted with patient not able to give any information.  HPI: 50 year old man with a known history of alcohol abuse and a past history of DTs and complicated withdrawal brought into the hospital from jail.  He had been in jail for a couple of days and suffered a fall with a head injury.  Patient was agitated and confused in the emergency room and required sedation.  I came to see the patient this afternoon he is in jail clothing handcuffed to the bed with an officer present.  He was asleep when I was told he had been asleep all afternoon.  Spoke his name a couple times but he did not respond.  No point in making any more effort to try and wake him up.  Patient's blood pressure is continuing to run elevated.  Had hypokalemia this morning that is being replenished.  It is reported that the patient had admitted to ongoing drinking up until the time he came into jail although the amount is unknown.  Unknown if any other drugs were involved.  Past Psychiatric History: Patient has a history of alcohol abuse with complicated withdrawal.  Risk to Self:   Risk to Others:   Prior Inpatient Therapy:   Prior Outpatient Therapy:    Past Medical  History:  Past Medical History:  Diagnosis Date  . Alcohol abuse   . Bipolar disorder (HCC)   . Coronary artery disease   . Heart attack (HCC)    x4  . Hx of CABG   . Hypertension   . Tobacco abuse     Past Surgical History:  Procedure Laterality Date  . CARDIAC SURGERY    . CORONARY ARTERY BYPASS GRAFT     Family History:  Family History  Problem Relation Age of Onset  . Diabetes Mellitus II Father   . Heart attack Father    Family Psychiatric  History: See previous Social History:  Social History   Substance and Sexual Activity  Alcohol Use Yes  . Alcohol/week: 28.0 standard drinks  . Types: 28 Cans of beer per week     Social History   Substance and Sexual Activity  Drug Use No    Social History   Socioeconomic History  . Marital status: Married    Spouse name: Not on file  . Number of children: Not on file  . Years of education: Not on file  . Highest education level: Not on file  Occupational History  . Not on file  Tobacco Use  . Smoking status: Current Every Day Smoker    Packs/day: 0.50    Types: Cigarettes  . Smokeless tobacco: Never Used  Vaping Use  . Vaping Use: Never used  Substance and Sexual Activity  . Alcohol use: Yes  Alcohol/week: 28.0 standard drinks    Types: 28 Cans of beer per week  . Drug use: No  . Sexual activity: Not on file  Other Topics Concern  . Not on file  Social History Narrative  . Not on file   Social Determinants of Health   Financial Resource Strain: Not on file  Food Insecurity: Not on file  Transportation Needs: Not on file  Physical Activity: Not on file  Stress: Not on file  Social Connections: Not on file   Additional Social History:    Allergies:   Allergies  Allergen Reactions  . Almond Oil Other (See Comments)    Reaction: unknown  . Cashew Nut Oil Other (See Comments)    Reaction: unknown  . Ibuprofen Hives  . Benadryl [Diphenhydramine] Other (See Comments)    Unknown     Labs:   Results for orders placed or performed during the hospital encounter of 01/24/21 (from the past 48 hour(s))  Comprehensive metabolic panel     Status: Abnormal   Collection Time: 01/24/21  6:01 PM  Result Value Ref Range   Sodium 130 (L) 135 - 145 mmol/L   Potassium 2.6 (LL) 3.5 - 5.1 mmol/L    Comment: CRITICAL RESULT CALLED TO, READ BACK BY AND VERIFIED WITH ANNIE SMITH @1848  ON 01/24/21 SKL    Chloride 93 (L) 98 - 111 mmol/L   CO2 23 22 - 32 mmol/L   Glucose, Bld 101 (H) 70 - 99 mg/dL    Comment: Glucose reference range applies only to samples taken after fasting for at least 8 hours.   BUN 32 (H) 6 - 20 mg/dL   Creatinine, Ser 4.091.32 (H) 0.61 - 1.24 mg/dL   Calcium 9.5 8.9 - 81.110.3 mg/dL   Total Protein 8.8 (H) 6.5 - 8.1 g/dL   Albumin 5.2 (H) 3.5 - 5.0 g/dL   AST 914243 (H) 15 - 41 U/L   ALT 115 (H) 0 - 44 U/L   Alkaline Phosphatase 51 38 - 126 U/L   Total Bilirubin 2.5 (H) 0.3 - 1.2 mg/dL   GFR, Estimated >78>60 >29>60 mL/min    Comment: (NOTE) Calculated using the CKD-EPI Creatinine Equation (2021)    Anion gap 14 5 - 15    Comment: Performed at [facility], [address] [place], [place] [address]  CBC     Status: Abnormal   Collection Time: 01/24/21  6:01 PM  Result Value Ref Range   WBC 9.3 4.0 - 10.5 K/uL   RBC 3.69 (L) 4.22 - 5.81 MIL/uL   Hemoglobin 11.8 (L) 13.0 - 17.0 g/dL   HCT 08.634.2 (L) 57.839.0 - 46.952.0 %   MCV 92.7 80.0 - 100.0 fL   MCH 32.0 26.0 - 34.0 pg   MCHC 34.5 30.0 - 36.0 g/dL   RDW 62.913.7 52.811.5 - 41.315.5 %   Platelets 144 (L) 150 - 400 K/uL   nRBC 0.0 0.0 - 0.2 %    Comment: Performed at [facility], [address] [place], [place] [address]  Ammonia     Status: None   Collection Time: 01/24/21  7:07 PM  Result Value Ref Range   Ammonia 19 9 - 35 umol/L    Comment: Performed at [facility], [address] [place], [place] [address]  Magnesium     Status: None   Collection Time: 01/24/21  7:07 PM  Result Value Ref Range    Magnesium 1.9 1.7 - 2.4 mg/dL  Comment: Performed at [facility], [address] [place], [place] [address]  Resp Panel by RT-PCR (Flu A&B, Covid) Nasopharyngeal Swab     Status: None   Collection Time: 01/24/21  8:28 PM   Specimen: Nasopharyngeal Swab; Nasopharyngeal(NP) swabs in vial transport medium  Result Value Ref Range   SARS Coronavirus 2 by RT PCR NEGATIVE NEGATIVE    Comment: (NOTE) SARS-CoV-2 target nucleic acids are NOT DETECTED.  The SARS-CoV-2 RNA is generally detectable in upper respiratory specimens during the acute phase of infection. The lowest concentration of SARS-CoV-2 viral copies this assay can detect is 138 copies/mL. A negative result does not preclude SARS-Cov-2 infection and should not be used as the sole basis for treatment or other patient management decisions. A negative result may occur with  improper specimen collection/handling, submission of specimen other than nasopharyngeal swab, presence of viral mutation(s) within the areas targeted by this assay, and inadequate number of viral copies(<138 copies/mL). A negative result must be combined with clinical observations, patient history, and epidemiological information. The expected result is Negative.  Fact Sheet for Patients:  BloggerCourse.com  Fact Sheet for Healthcare Providers:  SeriousBroker.it  This test is no t yet approved or cleared by the [place] FDA and  has been authorized for detection and/or diagnosis of SARS-CoV-2 by FDA under an Emergency Use Authorization (EUA). This EUA will remain  in effect (meaning this test can be used) for the duration of the COVID-19 declaration under Section 564(b)(1) of the Act, 21 U.S.C.section 360bbb-3(b)(1), unless the authorization is terminated  or revoked sooner.       Influenza A by PCR NEGATIVE NEGATIVE   Influenza B by PCR NEGATIVE NEGATIVE    Comment: (NOTE) The Xpert  Xpress SARS-CoV-2/FLU/RSV plus assay is intended as an aid in the diagnosis of influenza from Nasopharyngeal swab specimens and should not be used as a sole basis for treatment. Nasal washings and aspirates are unacceptable for Xpert Xpress SARS-CoV-2/FLU/RSV testing.  Fact Sheet for Patients: BloggerCourse.com  Fact Sheet for Healthcare Providers: SeriousBroker.it  This test is not yet approved or cleared by the [place] FDA and has been authorized for detection and/or diagnosis of SARS-CoV-2 by FDA under an Emergency Use Authorization (EUA). This EUA will remain in effect (meaning this test can be used) for the duration of the COVID-19 declaration under Section 564(b)(1) of the Act, 21 U.S.C. section 360bbb-3(b)(1), unless the authorization is terminated or revoked.  Performed at [facility], [address] [place], [place] [address]   Comprehensive metabolic panel     Status: Abnormal   Collection Time: 01/25/21  4:48 AM  Result Value Ref Range   Sodium 136 135 - 145 mmol/L   Potassium 2.9 (L) 3.5 - 5.1 mmol/L   Chloride 101 98 - 111 mmol/L   CO2 23 22 - 32 mmol/L   Glucose, Bld 75 70 - 99 mg/dL    Comment: Glucose reference range applies only to samples taken after fasting for at least 8 hours.   BUN 19 6 - 20 mg/dL   Creatinine, Ser 0.98 0.61 - 1.24 mg/dL   Calcium 8.9 8.9 - 11.9 mg/dL   Total Protein 7.5 6.5 - 8.1 g/dL   Albumin 4.3 3.5 - 5.0 g/dL   AST 147 (H) 15 - 41 U/L   ALT 89 (H) 0 - 44 U/L   Alkaline Phosphatase 49 38 - 126 U/L   Total Bilirubin 2.4 (H) 0.3 - 1.2 mg/dL   GFR,  Estimated >60 >60 mL/min    Comment: (NOTE) Calculated using the CKD-EPI Creatinine Equation (2021)    Anion gap 12 5 - 15    Comment: Performed at [facility], [address] [place], [place] [address]  CBC     Status: Abnormal   Collection Time: 01/25/21  4:48 AM  Result Value Ref Range   WBC 8.6 4.0 -  10.5 K/uL   RBC 3.38 (L) 4.22 - 5.81 MIL/uL   Hemoglobin 10.9 (L) 13.0 - 17.0 g/dL   HCT 01.0 (L) 27.2 - 53.6 %   MCV 94.1 80.0 - 100.0 fL   MCH 32.2 26.0 - 34.0 pg   MCHC 34.3 30.0 - 36.0 g/dL   RDW 64.4 03.4 - 74.2 %   Platelets 123 (L) 150 - 400 K/uL   nRBC 0.0 0.0 - 0.2 %    Comment: Performed at [facility], [address], [place], [place] [address]    Current Facility-Administered Medications  Medication Dose Route Frequency Provider Last Rate Last Admin  . acetaminophen (TYLENOL) tablet 650 mg  650 mg Oral Q6H PRN Andris Baumann, MD       Or  . acetaminophen (TYLENOL) suppository 650 mg  650 mg Rectal Q6H PRN Lindajo Royal V, MD      . dextrose 5 % 1,000 mL with potassium chloride 40 mEq infusion   Intravenous Continuous Ghimire, Lyndel Safe, MD      . enoxaparin (LOVENOX) injection 40 mg  40 mg Subcutaneous Q24H Andris Baumann, MD   40 mg at 01/24/21 2345  . folic acid (FOLVITE) tablet 1 mg  1 mg Oral Daily Andris Baumann, MD   1 mg at 01/25/21 [phone]  . LORazepam (ATIVAN) injection 0-4 mg  0-4 mg Intravenous Q6H Andris Baumann, MD   2 mg at 01/25/21 1009   Followed by  . [START ON 01/26/2021] LORazepam (ATIVAN) injection 0-4 mg  0-4 mg Intravenous Q12H Andris Baumann, MD      . LORazepam (ATIVAN) tablet 1-4 mg  1-4 mg Oral Q1H PRN Andris Baumann, MD       Or  . LORazepam (ATIVAN) injection 1-4 mg  1-4 mg Intravenous Q1H PRN Andris Baumann, MD   3 mg at 01/25/21 0306  . magnesium oxide (MAG-OX) tablet 800 mg  800 mg Oral Daily Dorcas Carrow, MD   800 mg at 01/25/21 4332  . multivitamin with minerals tablet 1 tablet  1 tablet Oral Daily Andris Baumann, MD   1 tablet at 01/25/21 [phone]  . OLANZapine (ZYPREXA) tablet 10 mg  10 mg Oral QHS Dorcas Carrow, MD      . ondansetron [facility]) tablet 4 mg  4 mg Oral Q6H PRN Andris Baumann, MD       Or  . ondansetron [facility]) injection 4 mg  4 mg Intravenous Q6H PRN Andris Baumann, MD      . potassium chloride SA (KLOR-CON) CR  tablet 40 mEq  40 mEq Oral BID Dorcas Carrow, MD   40 mEq at 01/25/21 0938  . thiamine 500mg  in normal saline (70ml) IVPB  500 mg Intravenous TID 45m, MD        Musculoskeletal: Strength & Muscle Tone: within normal limits Gait & Station: unable to stand Patient leans: N/A  Psychiatric Specialty Exam: Physical Exam Vitals and nursing note reviewed.  Constitutional:      Appearance: He is well-developed and well-nourished.  HENT:  Head: Normocephalic and atraumatic.  Eyes:     Conjunctiva/sclera: Conjunctivae normal.     Pupils: Pupils are equal, round, and reactive to light.  Cardiovascular:     Heart sounds: Normal heart sounds.  Pulmonary:     Effort: Pulmonary effort is normal.  Abdominal:     Palpations: Abdomen is soft.  Musculoskeletal:        General: Normal range of motion.     Cervical back: Normal range of motion.  Skin:    General: Skin is warm and dry.  Neurological:     General: No focal deficit present.     Mental Status: He is alert.  Psychiatric:        Attention and Perception: He is inattentive.        Speech: He is noncommunicative.     Review of Systems  Unable to perform ROS: Mental status change    Blood pressure (!) 158/97, pulse 79, temperature 98.3 F (36.8 C), temperature source Axillary, resp. rate 19, height 5\' 11"  (1.803 m), weight 77.1 kg, SpO2 96 %.Body mass index is 23.71 kg/m.  General Appearance: Negative  Eye Contact:  None  Speech:  Negative  Volume:  Decreased  Mood:  Negative  Affect:  Negative  Thought Process:  NA  Orientation:  Negative  Thought Content:  Negative  Suicidal Thoughts:  No  Homicidal Thoughts:  No  Memory:  Negative  Judgement:  Negative  Insight:  Negative  Psychomotor Activity:  Negative  Concentration:  Concentration: Negative  Recall:  Negative  Fund of Knowledge:  Negative  Language:  Negative  Akathisia:  Negative  Handed:  Right  AIMS (if indicated):     Assets:  Resilience   ADL's:  Impaired  Cognition:  Impaired,  Severe  Sleep:        Treatment Plan Summary: Plan No change to current CIWA orders.  If the patient were to become too agitated to be managed with Ativan would recommend transfer to the ICU and switching treatment to Precedex.  No change to any other psychiatric management at this point.  We will follow as needed.  Disposition: Patient does not meet criteria for psychiatric inpatient admission.  , MD 01/25/2021 5:17 PM

## 2021-01-26 DIAGNOSIS — F10231 Alcohol dependence with withdrawal delirium: Secondary | ICD-10-CM | POA: Diagnosis not present

## 2021-01-26 LAB — COMPREHENSIVE METABOLIC PANEL
ALT: 64 U/L — ABNORMAL HIGH (ref 0–44)
AST: 90 U/L — ABNORMAL HIGH (ref 15–41)
Albumin: 3.9 g/dL (ref 3.5–5.0)
Alkaline Phosphatase: 44 U/L (ref 38–126)
Anion gap: 8 (ref 5–15)
BUN: 12 mg/dL (ref 6–20)
CO2: 23 mmol/L (ref 22–32)
Calcium: 9 mg/dL (ref 8.9–10.3)
Chloride: 105 mmol/L (ref 98–111)
Creatinine, Ser: 0.6 mg/dL — ABNORMAL LOW (ref 0.61–1.24)
GFR, Estimated: >60 mL/min (ref >60)
Glucose, Bld: 83 mg/dL (ref 70–99)
Potassium: 4.2 mmol/L (ref 3.5–5.1)
Sodium: 136 mmol/L (ref 135–145)
Total Bilirubin: 2 mg/dL — ABNORMAL HIGH (ref 0.3–1.2)
Total Protein: 7 g/dL (ref 6.5–8.1)

## 2021-01-26 LAB — CBC WITH DIFFERENTIAL/PLATELET
Abs Immature Granulocytes: 0.03 10*3/uL (ref 0.00–0.07)
Basophils Absolute: 0.1 10*3/uL (ref 0.0–0.1)
Basophils Relative: 1 %
Eosinophils Absolute: 0.3 10*3/uL (ref 0.0–0.5)
Eosinophils Relative: 3 %
HCT: 33.9 % — ABNORMAL LOW (ref 39.0–52.0)
Hemoglobin: 11.4 g/dL — ABNORMAL LOW (ref 13.0–17.0)
Immature Granulocytes: 0 %
Lymphocytes Relative: 23 %
Lymphs Abs: 1.7 10*3/uL (ref 0.7–4.0)
MCH: 31.8 pg (ref 26.0–34.0)
MCHC: 33.6 g/dL (ref 30.0–36.0)
MCV: 94.7 fL (ref 80.0–100.0)
Monocytes Absolute: 1.5 10*3/uL — ABNORMAL HIGH (ref 0.1–1.0)
Monocytes Relative: 20 %
Neutro Abs: 4 10*3/uL (ref 1.7–7.7)
Neutrophils Relative %: 53 %
Platelets: 134 10*3/uL — ABNORMAL LOW (ref 150–400)
RBC: 3.58 MIL/uL — ABNORMAL LOW (ref 4.22–5.81)
RDW: 13.7 % (ref 11.5–15.5)
WBC: 7.6 10*3/uL (ref 4.0–10.5)
nRBC: 0 % (ref 0.0–0.2)

## 2021-01-26 LAB — PHOSPHORUS: Phosphorus: 1.9 mg/dL — ABNORMAL LOW (ref 2.5–4.6)

## 2021-01-26 LAB — MAGNESIUM: Magnesium: 1.8 mg/dL (ref 1.7–2.4)

## 2021-01-26 LAB — HIV ANTIBODY (ROUTINE TESTING W REFLEX): HIV Screen 4th Generation wRfx: NONREACTIVE

## 2021-01-26 MED ORDER — ADULT MULTIVITAMIN W/MINERALS CH: 1.0000 | ORAL_TABLET | Freq: Every day | ORAL | 0 refills | Status: AC

## 2021-01-26 MED ORDER — THIAMINE HCL 100 MG PO TABS: 50.0000 mg | ORAL_TABLET | Freq: Every day | ORAL | 0 refills | Status: AC

## 2021-01-26 MED ORDER — THIAMINE HCL 100 MG PO TABS: 100.0000 mg | ORAL_TABLET | Freq: Every day | ORAL | Status: DC

## 2021-01-26 MED ORDER — FOLIC ACID 1 MG PO TABS: 1.0000 mg | ORAL_TABLET | Freq: Every day | ORAL | 0 refills | Status: AC

## 2021-01-26 NOTE — Discharge Instructions (Signed)
Abstain from drink

## 2021-01-26 NOTE — Plan of Care (Signed)
Patient discharged per MD order.  AVS printed and given to Emergency planning/management officer.  Patient escorted out with Hydrographic surveyor.

## 2021-01-26 NOTE — Discharge Summary (Signed)
[facility] - [place] at [facility]   PATIENT NAME: Jason Atkins    MR#:  [id number]  DATE OF BIRTH:  08/21/1971  DATE OF ADMISSION:  01/24/2021 ADMITTING PHYSICIAN: Andris Baumann, MD  DATE OF DISCHARGE: 01/26/2021  PRIMARY CARE PHYSICIAN: [facility], [facility]    ADMISSION DIAGNOSIS:  Injury of head, initial encounter [S09.90XA] Laceration of scalp, initial encounter [S01.01XA] Alcohol withdrawal syndrome with perceptual disturbance (HCC) [F10.232] Closed displaced fracture of neck of fifth metacarpal bone of left hand, initial encounter [S62.337A] AMS (altered mental status) [R41.82]  DISCHARGE DIAGNOSIS:   acute metabolic encephalopathy suspected due to alcoholism left scalp hematoma/laceration closed displaced fracture of neck of fifth metacarpal bone-- conservative management  SECONDARY DIAGNOSIS:   Past Medical History:  Diagnosis Date   Alcohol abuse    Bipolar disorder (HCC)    Coronary artery disease    Heart attack (HCC)    x4   Hx of CABG    Hypertension    Tobacco abuse     HOSPITAL COURSE:  50 year old gentleman with history of coronary artery disease status post CABG, hypertension, alcohol use disorder, previous history of severe alcohol withdrawal  who is incarcerated since Friday brought to the ER with hallucination, fell on his back of the head.  CT head with left parietal scalp hematoma and laceration with no acute intracranial process Left hand x-ray showing mildly displaced and angulated distal fifth metacarpal fracture associated with soft tissue edema  Acute metabolic encephalopathy secondary to acute alcohol withdrawal delirium with underlying history of alcohol induced delirium: --Very high risk of complications from alcohol withdrawal.  Day 4 of abstinence after getting into jail. -- Patient was placed onCIWA protocol. His last dose was yesterday evening. Patient scored"2" today. More oriented and conversely. --  Eating well. -- Hemodynamically stable. Tachycardia resolved. --Patient is already on restraints from correctional office, use restraints to prevent fall and injuries as needed. --Patient with history of chronic alcoholism --Appreciate psychiatry help.  Previously Zyprexa was helpful for mood stabilization, will resume 10 mg at night. -- Patient advised abstinence.   Electrolyte abnormalities: Replace aggressively.  Potassium and magnesium repleted  Acute kidney injury: Secondary to dehydration.  Resolved.  -- in with creatinine of 1.3--- 0.6  Chronic liver disease with thrombocytopenia:  ---- Elevated LFTs secondary to alcoholic hepatitis improving.  Closed fracture of the fifth metacarpal bone of the left hand for conservative management.  -- patient has a splint. He will follow-up with Dr. Martha Clan who is on unassigned call on the day of discharge.  Left parietal scalp hematoma and scalp laceration  -- status post repair with five staples. -- Patient will need monitoring in the jail and consider remove after 7 to 10 days  Spoke with wife [name] on the phone. She is aware patient will return back to the jail.   DVT prophylaxis: enoxaparin (LOVENOX)  Start: 01/24/21 2300   Code Status: Full code Family Communication: Wife on the phone Disposition Plan:  to [facility] jail today    Overall improving. High risk for readmission given chronic alcoholism CONSULTS OBTAINED:  Treatment Team:  Clapacs, Jackquline Denmark, MD  DRUG ALLERGIES:   Allergies  Allergen Reactions   Almond Oil Other (See Comments)    Reaction: unknown   Cashew Nut Oil Other (See Comments)    Reaction: unknown   Ibuprofen Hives   Benadryl [Diphenhydramine] Other (See Comments)    Unknown     DISCHARGE MEDICATIONS:   Allergies  as of 01/26/2021      Reactions   Almond Oil Other (See Comments)   Reaction: unknown   Cashew Nut Oil Other (See Comments)   Reaction: unknown   Ibuprofen  Hives   Benadryl [diphenhydramine] Other (See Comments)   Unknown      Medication List    STOP taking these medications   benztropine 0.5 MG tablet Commonly known as: COGENTIN   metoCLOPramide 10 MG tablet Commonly known as: REGLAN     TAKE these medications   folic acid 1 MG tablet Commonly known as: FOLVITE Take 1 tablet (1 mg total) by mouth daily. Start taking on: January 27, 2021   multivitamin with minerals Tabs tablet Take 1 tablet by mouth daily. Start taking on: January 27, 2021   OLANZapine zydis 20 MG disintegrating tablet Commonly known as: ZYPREXA Take 1 tablet (20 mg total) by mouth at bedtime.   thiamine 100 MG tablet Take 0.5 tablets (50 mg total) by mouth daily.       If you experience worsening of your admission symptoms, develop shortness of breath, life threatening emergency, suicidal or homicidal thoughts you must seek medical attention immediately by calling 911 or calling your MD immediately  if symptoms less severe.  You Must read complete instructions/literature along with all the possible adverse reactions/side effects for all the Medicines you take and that have been prescribed to you. Take any new Medicines after you have completely understood and accept all the possible adverse reactions/side effects.   Please note  You were cared for by a hospitalist during your hospital stay. If you have any questions about your discharge medications or the care you received while you were in the hospital after you are discharged, you can call the unit and asked to speak with the hospitalist on call if the hospitalist that took care of you is not available. Once you are discharged, your primary care physician will handle any further medical issues. Please note that NO REFILLS for any discharge medications will be authorized once you are discharged, as it is imperative that you return to your primary care physician (or establish a relationship with a primary care  physician if you do not have one) for your aftercare needs so that they can reassess your need for medications and monitor your lab values. Today   SUBJECTIVE   Resting quietly. No agitation. According to RN Ativan given last night around 11 PM. Scoring very low on CIWA officer in the room.  VITAL SIGNS:  Blood pressure 121/75, pulse 71, temperature (!) 97.4 F (36.3 C), temperature source Oral, resp. rate 20, height 5\' 11"  (1.803 m), weight 71.9 kg, SpO2 100 %.  I/O:    Intake/Output Summary (Last 24 hours) at 01/26/2021 1232 Last data filed at 01/26/2021 0959 Gross per 24 hour  Intake 1472.57 ml  Output --  Net 1472.57 ml    PHYSICAL EXAMINATION:  GENERAL:  50 y.o.-year-old patient lying in the bed with no acute distress.  Poor dentition. LUNGS: Normal breath sounds bilaterally, no wheezing, rales,rhonchi or crepitation. No use of accessory muscles of respiration.  CARDIOVASCULAR: S1, S2 normal. No murmurs, rubs, or gallops.  ABDOMEN: Soft, non-tender, non-distended. Bowel sounds present. No organomegaly or mass.  EXTREMITIES: No pedal edema, cyanosis, or clubbing. Splint left arm NEUROLOGIC: grossly nonfocal  PSYCHIATRIC: The patient is alert and oriented x 2 answered most questions appropriately. SKIN: No obvious rash, lesion, or ulcer.  Scalp laceration sutured. No cellulitis/erythema DATA REVIEW:  CBC  Recent Labs  Lab 01/26/21 0854  WBC 7.6  HGB 11.4*  HCT 33.9*  PLT 134*    Chemistries  Recent Labs  Lab 01/26/21 0659  NA 136  K 4.2  CL 105  CO2 23  GLUCOSE 83  BUN 12  CREATININE 0.60*  CALCIUM 9.0  MG 1.8  AST 90*  ALT 64*  ALKPHOS 44  BILITOT 2.0*    Microbiology Results   Recent Results (from the past 240 hour(s))  Resp Panel by RT-PCR (Flu A&B, Covid) Nasopharyngeal Swab     Status: None   Collection Time: 01/24/21  8:28 PM   Specimen: Nasopharyngeal Swab; Nasopharyngeal(NP) swabs in vial transport medium  Result Value Ref Range Status    SARS Coronavirus 2 by RT PCR NEGATIVE NEGATIVE Final    Comment: (NOTE) SARS-CoV-2 target nucleic acids are NOT DETECTED.  The SARS-CoV-2 RNA is generally detectable in upper respiratory specimens during the acute phase of infection. The lowest concentration of SARS-CoV-2 viral copies this assay can detect is 138 copies/mL. A negative result does not preclude SARS-Cov-2 infection and should not be used as the sole basis for treatment or other patient management decisions. A negative result may occur with  improper specimen collection/handling, submission of specimen other than nasopharyngeal swab, presence of viral mutation(s) within the areas targeted by this assay, and inadequate number of viral copies(<138 copies/mL). A negative result must be combined with clinical observations, patient history, and epidemiological information. The expected result is Negative.  Fact Sheet for Patients:  BloggerCourse.com  Fact Sheet for Healthcare Providers:  SeriousBroker.it  This test is no t yet approved or cleared by the [place] FDA and  has been authorized for detection and/or diagnosis of SARS-CoV-2 by FDA under an Emergency Use Authorization (EUA). This EUA will remain  in effect (meaning this test can be used) for the duration of the COVID-19 declaration under Section 564(b)(1) of the Act, 21 U.S.C.section 360bbb-3(b)(1), unless the authorization is terminated  or revoked sooner.       Influenza A by PCR NEGATIVE NEGATIVE Final   Influenza B by PCR NEGATIVE NEGATIVE Final    Comment: (NOTE) The Xpert Xpress SARS-CoV-2/FLU/RSV plus assay is intended as an aid in the diagnosis of influenza from Nasopharyngeal swab specimens and should not be used as a sole basis for treatment. Nasal washings and aspirates are unacceptable for Xpert Xpress SARS-CoV-2/FLU/RSV testing.  Fact Sheet for  Patients: BloggerCourse.com  Fact Sheet for Healthcare Providers: SeriousBroker.it  This test is not yet approved or cleared by the [place] FDA and has been authorized for detection and/or diagnosis of SARS-CoV-2 by FDA under an Emergency Use Authorization (EUA). This EUA will remain in effect (meaning this test can be used) for the duration of the COVID-19 declaration under Section 564(b)(1) of the Act, 21 U.S.C. section 360bbb-3(b)(1), unless the authorization is terminated or revoked.  Performed at [facility], [address] [place], [place] [address]     RADIOLOGY:  CT Head Wo Contrast  Result Date: 01/24/2021 CLINICAL DATA:  Larey Seat and hit head, laceration, confusion EXAM: CT HEAD WITHOUT CONTRAST TECHNIQUE: Contiguous axial images were obtained from the base of the skull through the vertex without intravenous contrast. COMPARISON:  06/03/2020 FINDINGS: Brain: No acute infarct or hemorrhage. Lateral ventricles and midline structures are unremarkable. No acute extra-axial fluid collections. No mass effect. Vascular: No hyperdense vessel or unexpected calcification. Skull: Normal. Negative for fracture or focal lesion. Sinuses/Orbits: Minimal mucosal thickening right  ethmoid air cells. Remaining sinuses are clear. Other: Focal soft tissue edema and laceration within the left parietal scalp along the convexity. IMPRESSION: 1. Left parietal scalp hematoma and laceration. 2. No acute intracranial process. Electronically Signed   By: Sharlet Salina M.D.   On: 01/24/2021 18:53   DG Hand Complete Left  Result Date: 01/24/2021 CLINICAL DATA:  Swelling. EXAM: LEFT HAND - COMPLETE 3+ VIEW COMPARISON:  None. FINDINGS: Mildly displaced and angulated distal fifth metacarpal fracture. No intra-articular extension. No other fracture of the hand. No a joint spaces and alignment. Dorsal soft tissue edema overlies the metacarpals. Tiny well  corticated osseous density adjacent to the ulna styloid may represent sequela of remote prior injury or accessory ossicle. IMPRESSION: Mildly displaced and angulated distal fifth metacarpal fracture. Associated soft tissue edema. Electronically Signed   By: Narda Rutherford M.D.   On: 01/24/2021 18:36     CODE STATUS:     Code Status Orders  (From admission, onward)         Start     Ordered   01/24/21 2255  Full code  Continuous        01/24/21 2256        Code Status History    Date Active Date Inactive Code Status Order ID Comments User Context   01/24/2021 2026 01/24/2021 2256 Full Code [id number]  Merwyn Katos, MD ED   06/03/2020 2139 06/22/2020 2332 Full Code [id number]  Lorretta Harp, MD Inpatient   06/01/2020 1749 06/03/2020 2138 Full Code [id number]  Chesley Noon, MD ED   07/28/2019 0410 07/29/2019 1651 Full Code [id number]  Arnaldo Natal, MD Inpatient   Advance Care Planning Activity       TOTAL TIME TAKING CARE OF THIS PATIENT: *35* minutes.    Enedina Finner M.D  [facility]    CC: Primary care physician; [facility], [facility]

## 2021-05-19 ENCOUNTER — Emergency Department
Admission: EM | Admit: 2021-05-19 | Discharge: 2021-05-19 | Disposition: A | Discharge status: Home / Self Care | Attending: Emergency Medicine | Admitting: Emergency Medicine

## 2021-05-19 ENCOUNTER — Other Ambulatory Visit: Payer: Self-pay

## 2021-05-19 ENCOUNTER — Encounter: Payer: Self-pay | Admitting: Emergency Medicine

## 2021-05-19 DIAGNOSIS — F17210 Nicotine dependence, cigarettes, uncomplicated: Secondary | ICD-10-CM | POA: Insufficient documentation

## 2021-05-19 DIAGNOSIS — F1010 Alcohol abuse, uncomplicated: Secondary | ICD-10-CM | POA: Diagnosis present

## 2021-05-19 DIAGNOSIS — Z951 Presence of aortocoronary bypass graft: Secondary | ICD-10-CM | POA: Diagnosis not present

## 2021-05-19 DIAGNOSIS — Z20822 Contact with and (suspected) exposure to covid-19: Secondary | ICD-10-CM | POA: Diagnosis not present

## 2021-05-19 DIAGNOSIS — I10 Essential (primary) hypertension: Secondary | ICD-10-CM | POA: Diagnosis not present

## 2021-05-19 DIAGNOSIS — I2510 Atherosclerotic heart disease of native coronary artery without angina pectoris: Secondary | ICD-10-CM | POA: Diagnosis not present

## 2021-05-19 DIAGNOSIS — Z046 Encounter for general psychiatric examination, requested by authority: Secondary | ICD-10-CM | POA: Insufficient documentation

## 2021-05-19 DIAGNOSIS — Y908 Blood alcohol level of 240 mg/100 ml or more: Secondary | ICD-10-CM | POA: Diagnosis not present

## 2021-05-19 LAB — CBC
HCT: 38.1 % — ABNORMAL LOW (ref 39.0–52.0)
Hemoglobin: 13.1 g/dL (ref 13.0–17.0)
MCH: 30.2 pg (ref 26.0–34.0)
MCHC: 34.4 g/dL (ref 30.0–36.0)
MCV: 87.8 fL (ref 80.0–100.0)
Platelets: 138 10*3/uL — ABNORMAL LOW (ref 150–400)
RBC: 4.34 MIL/uL (ref 4.22–5.81)
RDW: 14.7 % (ref 11.5–15.5)
WBC: 5.8 10*3/uL (ref 4.0–10.5)
nRBC: 0 % (ref 0.0–0.2)

## 2021-05-19 LAB — COMPREHENSIVE METABOLIC PANEL
ALT: 77 U/L — ABNORMAL HIGH (ref 0–44)
AST: 202 U/L — ABNORMAL HIGH (ref 15–41)
Albumin: 4.9 g/dL (ref 3.5–5.0)
Alkaline Phosphatase: 71 U/L (ref 38–126)
Anion gap: 10 (ref 5–15)
BUN: <5 mg/dL — ABNORMAL LOW (ref 6–20)
CO2: 27 mmol/L (ref 22–32)
Calcium: 9.2 mg/dL (ref 8.9–10.3)
Chloride: 100 mmol/L (ref 98–111)
Creatinine, Ser: 0.74 mg/dL (ref 0.61–1.24)
GFR, Estimated: >60 mL/min (ref >60)
Glucose, Bld: 114 mg/dL — ABNORMAL HIGH (ref 70–99)
Potassium: 3.8 mmol/L (ref 3.5–5.1)
Sodium: 137 mmol/L (ref 135–145)
Total Bilirubin: 0.9 mg/dL (ref 0.3–1.2)
Total Protein: 8.7 g/dL — ABNORMAL HIGH (ref 6.5–8.1)

## 2021-05-19 LAB — URINE DRUG SCREEN, QUALITATIVE (ARMC ONLY)
Amphetamines, Ur Screen: NOT DETECTED
Barbiturates, Ur Screen: NOT DETECTED
Benzodiazepine, Ur Scrn: NOT DETECTED
Cannabinoid 50 Ng, Ur ~~LOC~~: NOT DETECTED
Cocaine Metabolite,Ur ~~LOC~~: NOT DETECTED
MDMA (Ecstasy)Ur Screen: NOT DETECTED
Methadone Scn, Ur: NOT DETECTED
Opiate, Ur Screen: NOT DETECTED
Phencyclidine (PCP) Ur S: NOT DETECTED
Tricyclic, Ur Screen: NOT DETECTED

## 2021-05-19 LAB — SALICYLATE LEVEL: Salicylate Lvl: <7 mg/dL — ABNORMAL LOW (ref 7.0–30.0)

## 2021-05-19 LAB — ETHANOL: Alcohol, Ethyl (B): 271 mg/dL — ABNORMAL HIGH (ref <10)

## 2021-05-19 LAB — ACETAMINOPHEN LEVEL: Acetaminophen (Tylenol), Serum: <10 ug/mL — ABNORMAL LOW (ref 10–30)

## 2021-05-19 NOTE — ED Provider Notes (Addendum)
[facility] Emergency Department Provider Note  ____________________________________________   Event Date/Time   First MD Initiated Contact with Patient 05/19/21 1525     (approximate)  I have reviewed the triage vital signs and the nursing notes.   HISTORY  Chief Complaint Psychiatric Evaluation    HPI Jason Atkins is a 50 y.o. male with bipolar, alcohol abuse, hypertension, prior heart attack who comes in under IVC for need for psychiatric evaluation.  Patient's not been taking care of his mental health medications per his wife and refusing to get out of the car.  Patient does admit to drinking today.  He also admits to smoking a cigarette.  He states that he was in the car at his wife's work and she got mad at him.  He states that he is not supposed to be on any medications for psychiatric stuff.  Denies any SI or HI.  Denies any falls or any injuries or any other medical concerns.          Past Medical History:  Diagnosis Date   Alcohol abuse    Bipolar disorder (HCC)    Coronary artery disease    Heart attack (HCC)    x4   Hx of CABG    Hypertension    Tobacco abuse     Patient Active Problem List   Diagnosis Date Noted   Hx of CABG 01/24/2021   AKI (acute kidney injury) (HCC) 01/24/2021   Hyponatremia 01/24/2021   Hypokalemia 01/24/2021   History of alcohol use disorder 01/24/2021   AMS (altered mental status) 01/24/2021   Closed fracture of fifth metacarpal bone of left hand 01/24/2021   Scalp laceration, initial encounter 01/24/2021   Alcohol use disorder, severe, dependence (HCC) 06/13/2020   Major depressive disorder, 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 Authorizing Provider  folic acid (FOLVITE) 1 MG tablet Take 1 tablet (1 mg total) by mouth daily. 01/27/21   Enedina Finner, MD  Multiple Vitamin (MULTIVITAMIN WITH MINERALS) TABS tablet Take 1 tablet by mouth daily. 01/27/21   Enedina Finner, MD  OLANZapine zydis (ZYPREXA) 20 MG disintegrating tablet Take 1 tablet (20 mg total) by mouth at bedtime. 06/22/20 07/22/20  Charise Killian, MD  thiamine 100 MG tablet Take 0.5 tablets (50 mg total) by mouth daily. 01/26/21   Enedina Finner, MD    Allergies Almond oil, Cashew nut oil, Ibuprofen, and Benadryl [diphenhydramine]  Family History  Problem Relation Age of Onset   Diabetes Mellitus II Father    Heart attack Father     Social History Social History   Tobacco Use   Smoking status: Every Day    Packs/day: 0.50    Pack years: 0.00    Types: Cigarettes   Smokeless tobacco: Never  Vaping Use   Vaping Use: Never used  Substance Use Topics   Alcohol use: Yes    Alcohol/week: 28.0 standard drinks  Types: 28 Cans of beer per week   Drug use: No      Review of Systems Constitutional: No fever/chills Eyes: No visual changes. ENT: No sore throat. Cardiovascular: Denies chest pain. Respiratory: Denies shortness of breath. Gastrointestinal: No abdominal pain.  No nausea, no vomiting.  No diarrhea.  No constipation. Genitourinary: Negative for dysuria. Musculoskeletal: Negative for back pain. Skin: Negative for rash. Neurological: Negative for headaches, focal weakness or numbness. All other ROS negative ____________________________________________   PHYSICAL EXAM:  VITAL SIGNS: ED Triage Vitals [05/19/21 1514]  Enc Vitals Group     BP (!) 150/99     Pulse Rate (!) 117     Resp (!) 96     Temp 99.1 F  (37.3 C)     Temp Source Oral     SpO2 96 %     Weight 180 lb (81.6 kg)     Height 5\' 11"  (1.803 m)     Head Circumference      Peak Flow      Pain Score 0     Pain Loc      Pain Edu?      Excl. in GC?     Constitutional: Alert and oriented. Well appearing and in no acute distress. Eyes: Conjunctivae are normal. No swelling around eyes Head: Atraumatic. Nose: No congestion/rhinnorhea. Mouth/Throat: Mucous membranes are moist.   Neck: No stridor. Trachea Midline. FROM Cardiovascular: Normal rate, no swelling noted Respiratory: No increased wob, no stridor Gastrointestinal: Soft and nontender. No distention. No abdominal bruits.  Musculoskeletal: No lower extremity tenderness nor edema.  No joint effusions. Neurologic:  Normal speech and language. No gross focal neurologic deficits are appreciated.  Skin:  Skin is warm, dry and intact. No rash noted. Psychiatric: Denies SI, HI, does endorse EtOH GU: Deferred   ____________________________________________   LABS (all labs ordered are listed, but only abnormal results are displayed)  Labs Reviewed  COMPREHENSIVE METABOLIC PANEL - Abnormal; Notable for the following components:      Result Value   Glucose, Bld 114 (*)    BUN <5 (*)    Total Protein 8.7 (*)    AST 202 (*)    ALT 77 (*)    All other components within normal limits  ETHANOL - Abnormal; Notable for the following components:   Alcohol, Ethyl (B) 271 (*)    All other components within normal limits  CBC - Abnormal; Notable for the following components:   HCT 38.1 (*)    Platelets 138 (*)    All other components within normal limits  SALICYLATE LEVEL  ACETAMINOPHEN LEVEL  URINE DRUG SCREEN, QUALITATIVE ([facility] ONLY)   ____________________________________________    PROCEDURES  Procedure(s) performed (including Critical Care):  Procedures   ____________________________________________   INITIAL IMPRESSION / ASSESSMENT AND PLAN / ED COURSE  [name] was evaluated in Emergency Department on 05/19/2021 for the symptoms described in the history of present illness. He was evaluated in the context of the global COVID-19 pandemic, which necessitated consideration that the patient might be at risk for infection with the SARS-CoV-2 virus that causes COVID-19. Institutional protocols and algorithms that pertain to the evaluation of patients at risk for COVID-19 are in a state of rapid change based on information released by regulatory bodies including the CDC and federal and state organizations. These policies and algorithms were followed during the patient's care in the ED.    Pt is without any acute medical complaints. No exam findings to  suggest medical cause of current presentation. Will order psychiatric screening labs and discuss further w/ psychiatric service.  D/d includes but is not limited to psychiatric disease, behavioral/personality disorder, inadequate socioeconomic support, medical.  Based on HPI, exam, unremarkable labs, no concern for acute medical problem at this time. No rigidity, clonus, hyperthermia, focal neurologic deficit, diaphoresis, tachycardia, meningismus, ataxia, gait abnormality or other finding to suggest this visit represents a non-psychiatric problem. Screening labs reviewed.    Given this, pt medically cleared, to be dispositioned per Psych.    The patient has been placed in psychiatric observation due to the need to provide a safe environment for the patient while obtaining psychiatric consultation and evaluation, as well as ongoing medical and medication management to treat the patient's condition.  The patient has been placed under full IVC at this time.   Labs are reassuring.  LFTs slightly elevated but probably from his alcohol abuse and similar to previous.  His alcohol level is 271.  Patient was seen by psychiatry and IVC was reversed.  Patient is acting his normal baseline self.  He is ambulatory, speaking  in full sentences does not appear to be intoxicated.  He feels comfortable with being discharged home continues to deny any SI        ____________________________________________   FINAL CLINICAL IMPRESSION(S) / ED DIAGNOSES   Final diagnoses:  Alcohol abuse      MEDICATIONS GIVEN DURING THIS VISIT:  Medications - No data to display   ED Discharge Orders     None        Note:  This document was prepared using Dragon voice recognition software and may include unintentional dictation errors.    Concha Se, MD 05/19/21 1726    Concha Se, MD 05/19/21 [phone]

## 2021-05-19 NOTE — ED Notes (Signed)
Pt given dinner  

## 2021-05-19 NOTE — Discharge Instructions (Addendum)
Your IVC was reversed by psychiatry.  If you become interested in wanting to stop drink alcohol please return to the ER to help get support

## 2021-05-19 NOTE — ED Triage Notes (Addendum)
Pt comes into the ED via [facility] under IVC for need of psychiatric evaluation.  Pt has been not taking his mental health medication per his wife, and refusing to get out of her car.  Pt does admit he was drinking in the car today.  Pt has h/o depression and bipolar. Pt denies any SI or HI.

## 2021-05-19 NOTE — Consult Note (Signed)
[facility] Face-to-Face Psychiatry Consult   Reason for Consult: Consult for 50 year old man with a history of alcohol abuse brought in under IVC filed by wife Referring Physician: Fuller Plan Patient Identification: Jason Atkins MRN:  [id number] Principal Diagnosis: Alcohol abuse Diagnosis:  Principal Problem:   Alcohol abuse   Total Time spent with patient: 1 hour  Subjective:   Jason Atkins is a 50 y.o. male patient admitted with "I am feeling fine".  HPI: 50 year old man with a history of alcohol abuse.  Wife filed commitment claiming that the patient is behaving abnormally.  Saying he is not taking his psychiatric medicine.  Patient on interview was alert and oriented calm and cooperative.  He says he is feeling fine.  He says he and his wife got into an argument about his habit of smoking inside the car that they live in and that is why she had them brought to the hospital.  Patient said his mood recently has been fine.  Denies depression.  Denies any psychotic symptoms.  Denies any worsening physical symptoms.  Admits that he continues to drink.  Estimates his daily alcohol use as 5-6 regular size beers a day.  He denies using any other drugs currently.  Says he thinks he has probably had about 4 beers today.  Blood alcohol level on presentation about 270.  Patient is lucid calm not shaky not showing any signs of delirium denies any hallucinations.  Past Psychiatric History: Multiple psychiatric presentations all for alcohol abuse.  Symptoms of mood disorder have been considered in the past but seem to always be in the context of alcohol abuse or withdrawal.  History of delirium tremens previously.  Patient has little insight into his alcohol use problem and does not engage in any attempt to stay sober  Risk to Self:   Risk to Others:   Prior Inpatient Therapy:   Prior Outpatient Therapy:    Past Medical History:  Past Medical History:  Diagnosis Date   Alcohol abuse    Bipolar disorder (HCC)     Coronary artery disease    Heart attack (HCC)    x4   Hx of CABG    Hypertension    Tobacco abuse     Past Surgical History:  Procedure Laterality Date   CARDIAC SURGERY     CORONARY ARTERY BYPASS GRAFT     Family History:  Family History  Problem Relation Age of Onset   Diabetes Mellitus II Father    Heart attack Father    Family Psychiatric  History: See previous Social History:  Social History   Substance and Sexual Activity  Alcohol Use Yes   Alcohol/week: 28.0 standard drinks   Types: 28 Cans of beer per week     Social History   Substance and Sexual Activity  Drug Use No    Social History   Socioeconomic History   Marital status: Married    Spouse name: Not on file   Number of children: Not on file   Years of education: Not on file   Highest education level: Not on file  Occupational History   Not on file  Tobacco Use   Smoking status: Every Day    Packs/day: 0.50    Pack years: 0.00    Types: Cigarettes   Smokeless tobacco: Never  Vaping Use   Vaping Use: Never used  Substance and Sexual Activity   Alcohol use: Yes    Alcohol/week: 28.0 standard drinks    Types: 28 Cans of  beer per week   Drug use: No   Sexual activity: Not on file  Other Topics Concern   Not on file  Social History Narrative   Not on file   Social Determinants of Health   Financial Resource Strain: Not on file  Food Insecurity: Not on file  Transportation Needs: Not on file  Physical Activity: Not on file  Stress: Not on file  Social Connections: Not on file   Additional Social History:    Allergies:   Allergies  Allergen Reactions   Almond Oil Other (See Comments)    Reaction: unknown   Cashew Nut Oil Other (See Comments)    Reaction: unknown   Ibuprofen Hives   Benadryl [Diphenhydramine] Other (See Comments)    Unknown     Labs:  Results for orders placed or performed during the hospital encounter of 05/19/21 (from the past 48 hour(s))  Comprehensive  metabolic panel     Status: Abnormal   Collection Time: 05/19/21  3:16 PM  Result Value Ref Range   Sodium 137 135 - 145 mmol/L   Potassium 3.8 3.5 - 5.1 mmol/L   Chloride 100 98 - 111 mmol/L   CO2 27 22 - 32 mmol/L   Glucose, Bld 114 (H) 70 - 99 mg/dL    Comment: Glucose reference range applies only to samples taken after fasting for at least 8 hours.   BUN <5 (L) 6 - 20 mg/dL   Creatinine, Ser 1.61 0.61 - 1.24 mg/dL   Calcium 9.2 8.9 - 09.6 mg/dL   Total Protein 8.7 (H) 6.5 - 8.1 g/dL   Albumin 4.9 3.5 - 5.0 g/dL   AST 045 (H) 15 - 41 U/L   ALT 77 (H) 0 - 44 U/L   Alkaline Phosphatase 71 38 - 126 U/L   Total Bilirubin 0.9 0.3 - 1.2 mg/dL   GFR, Estimated >40 >98 mL/min    Comment: (NOTE) Calculated using the CKD-EPI Creatinine Equation (2021)    Anion gap 10 5 - 15    Comment: Performed at [facility], [address] [place], [place] [address]  Ethanol     Status: Abnormal   Collection Time: 05/19/21  3:16 PM  Result Value Ref Range   Alcohol, Ethyl (B) 271 (H) <10 mg/dL    Comment: (NOTE) Lowest detectable limit for serum alcohol is 10 mg/dL.  For medical purposes only. Performed at [facility], [address] [place], [place] [address]   Salicylate level     Status: Abnormal   Collection Time: 05/19/21  3:16 PM  Result Value Ref Range   Salicylate Lvl <7.0 (L) 7.0 - 30.0 mg/dL    Comment: Performed at [facility], [address] [place], [place] [address]  Acetaminophen level     Status: Abnormal   Collection Time: 05/19/21  3:16 PM  Result Value Ref Range   Acetaminophen (Tylenol), Serum <10 (L) 10 - 30 ug/mL    Comment: (NOTE) Therapeutic concentrations vary significantly. A range of 10-30 ug/mL  may be an effective concentration for many patients. However, some  are best treated at concentrations outside of this range. Acetaminophen concentrations >150 ug/mL at 4 hours after ingestion  and >50 ug/mL at 12 hours after  ingestion are often associated with  toxic reactions.  Performed at [facility], [address], [place], [place] [address]   cbc     Status: Abnormal   Collection Time: 05/19/21  3:16 PM  Result Value  Ref Range   WBC 5.8 4.0 - 10.5 K/uL   RBC 4.34 4.22 - 5.81 MIL/uL   Hemoglobin 13.1 13.0 - 17.0 g/dL   HCT 93.8 (L) 18.2 - 99.3 %   MCV 87.8 80.0 - 100.0 fL   MCH 30.2 26.0 - 34.0 pg   MCHC 34.4 30.0 - 36.0 g/dL   RDW 71.6 96.7 - 89.3 %   Platelets 138 (L) 150 - 400 K/uL   nRBC 0.0 0.0 - 0.2 %    Comment: Performed at [facility], [address] [place], [place] [address]  Urine Drug Screen, Qualitative     Status: None   Collection Time: 05/19/21  4:02 PM  Result Value Ref Range   Tricyclic, Ur Screen NONE DETECTED NONE DETECTED   Amphetamines, Ur Screen NONE DETECTED NONE DETECTED   MDMA (Ecstasy)Ur Screen NONE DETECTED NONE DETECTED   Cocaine Metabolite,Ur [place] NONE DETECTED NONE DETECTED   Opiate, Ur Screen NONE DETECTED NONE DETECTED   Phencyclidine (PCP) Ur S NONE DETECTED NONE DETECTED   Cannabinoid 50 Ng, Ur [place] NONE DETECTED NONE DETECTED   Barbiturates, Ur Screen NONE DETECTED NONE DETECTED   Benzodiazepine, Ur Scrn NONE DETECTED NONE DETECTED   Methadone Scn, Ur NONE DETECTED NONE DETECTED    Comment: (NOTE) Tricyclics + metabolites, urine    Cutoff 1000 ng/mL Amphetamines + metabolites, urine  Cutoff 1000 ng/mL MDMA (Ecstasy), urine              Cutoff 500 ng/mL Cocaine Metabolite, urine          Cutoff 300 ng/mL Opiate + metabolites, urine        Cutoff 300 ng/mL Phencyclidine (PCP), urine         Cutoff 25 ng/mL Cannabinoid, urine                 Cutoff 50 ng/mL Barbiturates + metabolites, urine  Cutoff 200 ng/mL Benzodiazepine, urine              Cutoff 200 ng/mL Methadone, urine                   Cutoff 300 ng/mL  The urine drug screen provides only a preliminary, unconfirmed analytical test result and should not be used for  non-medical purposes. Clinical consideration and professional judgment should be applied to any positive drug screen result due to possible interfering substances. A more specific alternate chemical method must be used in order to obtain a confirmed analytical result. Gas chromatography / mass spectrometry (GC/MS) is the preferred confirm atory method. Performed at [facility], [address] [place], [place] [address]     No current facility-administered medications for this encounter.   Current Outpatient Medications  Medication Sig Dispense Refill   folic acid (FOLVITE) 1 MG tablet Take 1 tablet (1 mg total) by mouth daily. 30 tablet 0   Multiple Vitamin (MULTIVITAMIN WITH MINERALS) TABS tablet Take 1 tablet by mouth daily. 30 tablet 0   OLANZapine zydis (ZYPREXA) 20 MG disintegrating tablet Take 1 tablet (20 mg total) by mouth at bedtime. 30 tablet 0   thiamine 100 MG tablet Take 0.5 tablets (50 mg total) by mouth daily. 30 tablet 0    Musculoskeletal: Strength & Muscle Tone: within normal limits Gait & Station: normal Patient leans: N/A            Psychiatric Specialty Exam:  Presentation  General Appearance:  No data recorded Eye Contact: No data recorded Speech:  No data recorded Speech Volume: No data recorded Handedness: No data recorded  Mood and Affect  Mood: No data recorded Affect: No data recorded  Thought Process  Thought Processes: No data recorded Descriptions of Associations:No data recorded Orientation:No data recorded Thought Content:No data recorded History of Schizophrenia/Schizoaffective disorder:No data recorded Duration of Psychotic Symptoms:No data recorded Hallucinations:No data recorded Ideas of Reference:No data recorded Suicidal Thoughts:No data recorded Homicidal Thoughts:No data recorded  Sensorium  Memory: No data recorded Judgment: No data recorded Insight: No data recorded  Executive Functions   Concentration: No data recorded Attention Span: No data recorded Recall: No data recorded Fund of Knowledge: No data recorded Language: No data recorded  Psychomotor Activity  Psychomotor Activity: No data recorded  Assets  Assets: No data recorded  Sleep  Sleep: No data recorded  Physical Exam: Physical Exam Vitals and nursing note reviewed.  Constitutional:      Appearance: Normal appearance.  HENT:     Head: Normocephalic and atraumatic.     Mouth/Throat:     Pharynx: Oropharynx is clear.  Eyes:     Pupils: Pupils are equal, round, and reactive to light.  Cardiovascular:     Rate and Rhythm: Normal rate and regular rhythm.  Pulmonary:     Effort: Pulmonary effort is normal.     Breath sounds: Normal breath sounds.  Abdominal:     General: Abdomen is flat.     Palpations: Abdomen is soft.  Musculoskeletal:        General: Normal range of motion.  Skin:    General: Skin is warm and dry.  Neurological:     General: No focal deficit present.     Mental Status: He is alert. Mental status is at baseline.  Psychiatric:        Attention and Perception: Attention normal.        Mood and Affect: Mood normal.        Speech: Speech normal.        Behavior: Behavior normal.        Thought Content: Thought content normal.        Cognition and Memory: Memory is impaired.   Review of Systems  Constitutional: Negative.   HENT: Negative.    Eyes: Negative.   Respiratory: Negative.    Cardiovascular: Negative.   Gastrointestinal: Negative.   Musculoskeletal: Negative.   Skin: Negative.   Neurological: Negative.   Psychiatric/Behavioral: Negative.    Blood pressure (!) 150/99, pulse (!) 117, temperature 99.1 F (37.3 C), temperature source Oral, resp. rate (!) 96, height 5\' 11"  (1.803 m), weight 81.6 kg, SpO2 96 %. Body mass index is 25.1 kg/m.  Treatment Plan Summary: Plan at this point the patient, while intoxicated, is lucid and not showing any signs of  psychosis.  Mood is stable.  Behaving fine.  No evidence of any attempt to harm himself or thoughts of suicide no threats to others.  Patient really does not meet commitment criteria.  Asked him whether he would like to be referred for detox and he said he thought that would be a waste of time and he would prefer just to go to his father's house.  Patient will be taken off of IVC.  Case will be discussed with emergency room physician about what the proper further disposition is but at this point does not need any psychiatric involvement.  Patient has been educated that he does have a history of complicated withdrawal and could suffer that if he goes  through detox on his own.  Disposition: No evidence of imminent risk to self or others at present.   Patient does not meet criteria for psychiatric inpatient admission. Supportive therapy provided about ongoing stressors.  Mordecai RasmussenJohn Beckie Viscardi, MD 05/19/2021 5:45 PM

## 2021-05-19 NOTE — ED Notes (Signed)
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

## 2021-05-20 LAB — SARS CORONAVIRUS 2 (TAT 6-24 HRS): SARS Coronavirus 2: NEGATIVE

## 2021-12-11 IMAGING — CT CT HEAD W/O CM
3 series · 15 of 47 positions shown, 18 images · non-contrast
Comparison: None.

CLINICAL DATA: Headache, head trauma, alcohol withdrawal

EXAM:
CT HEAD WITHOUT CONTRAST
TECHNIQUE: Contiguous axial images were obtained from the base of the skull
through the vertex without intravenous contrast.

[Series 2: head wo · axial · 0.44mm/px · z∈[-97,+33]mm · 9 of 32 slices shown, 12 images]
[im 3/32  brain]
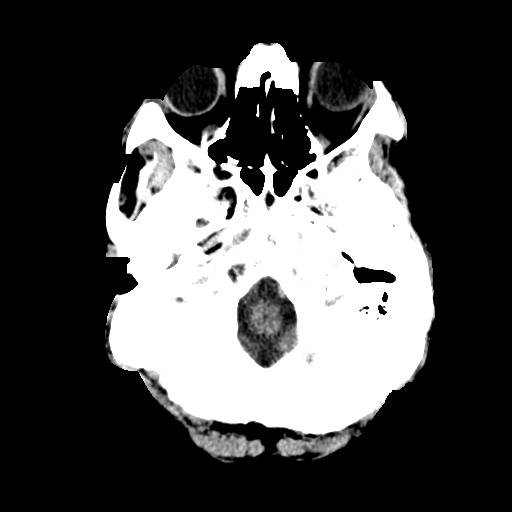
[im 3/32  bone]
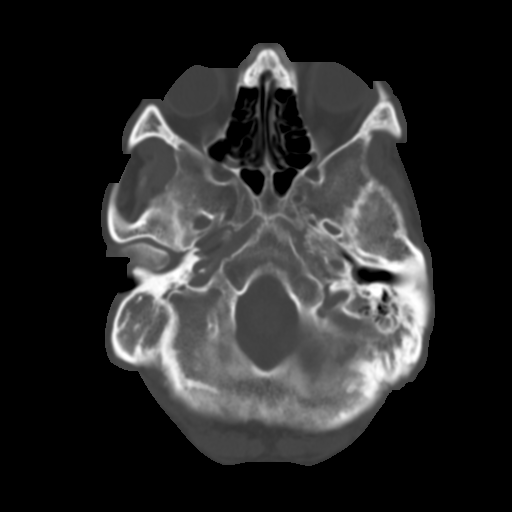
[im 6/32  brain]
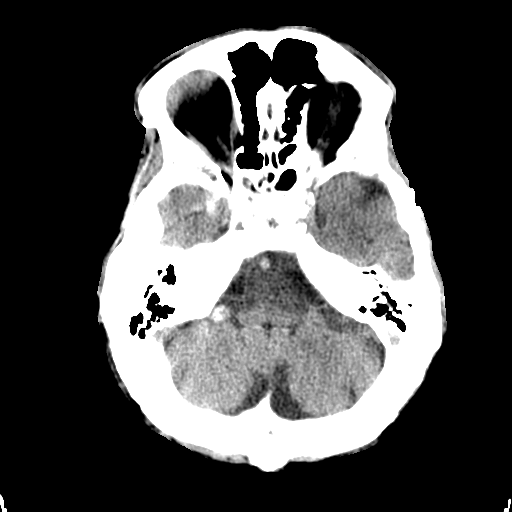
[im 9/32  brain]
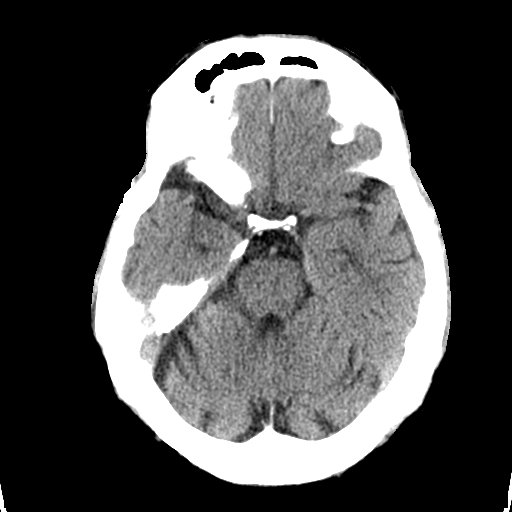
[im 12/32  brain]
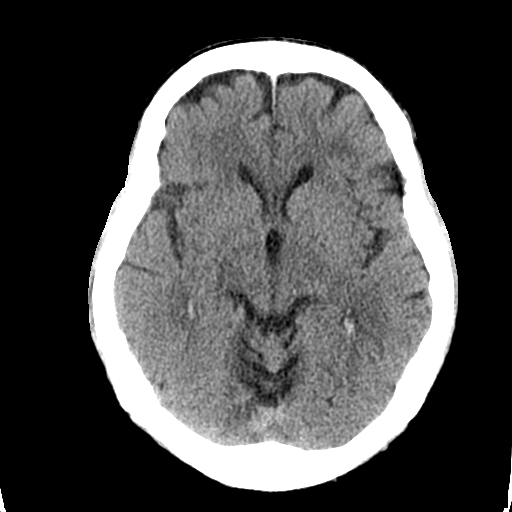
[im 17/32  brain]
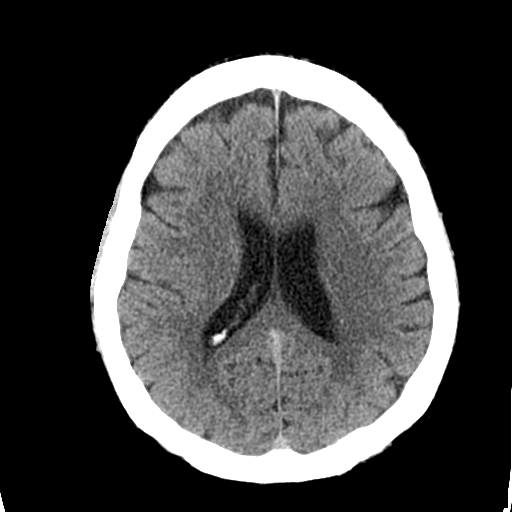
[im 17/32  bone]
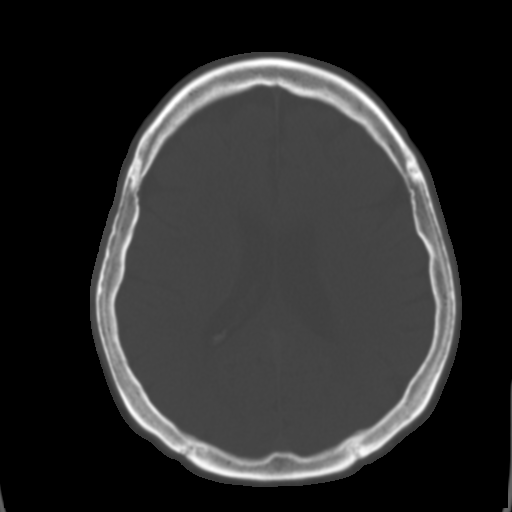
[im 20/32  brain]
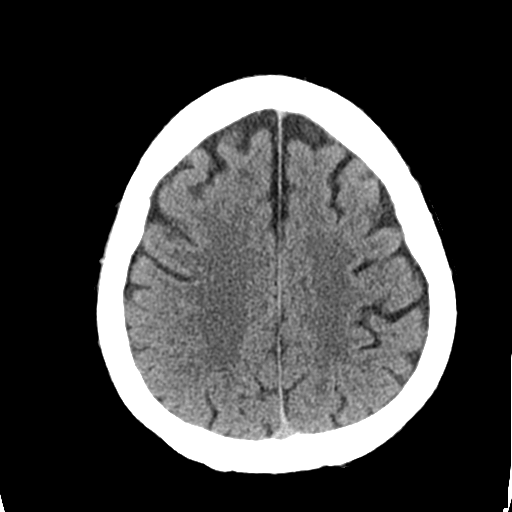
[im 23/32  brain]
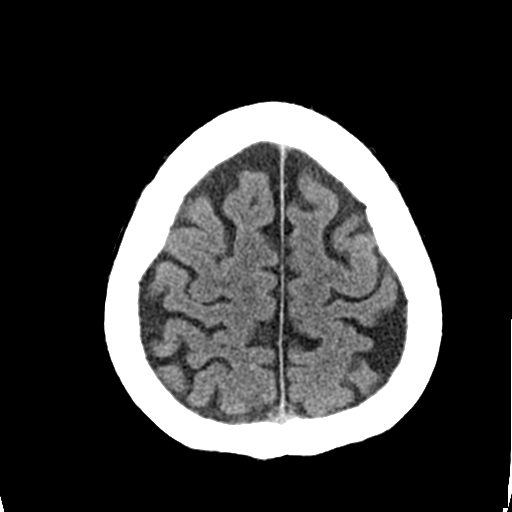
[im 26/32  brain]
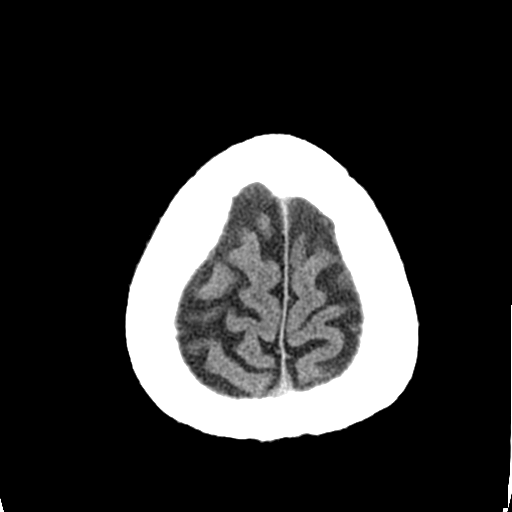
[im 29/32  brain]
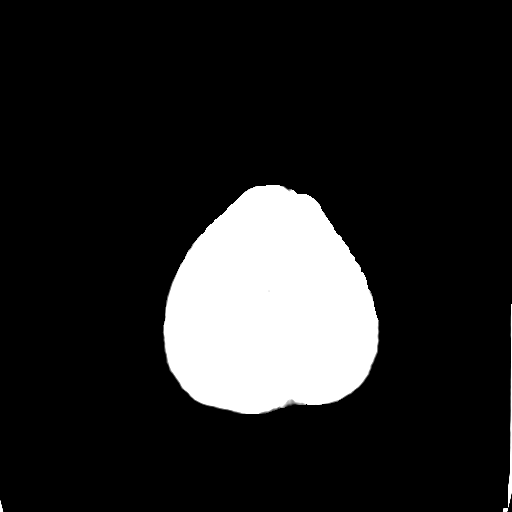
[im 29/32  bone]
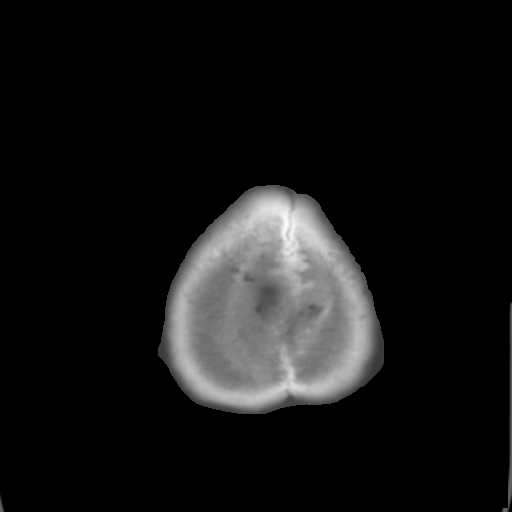

[Series 4: coronal soft tissue · coronal · 0.33mm/px · 3 of 70 slices shown]
[im 24/70  brain]
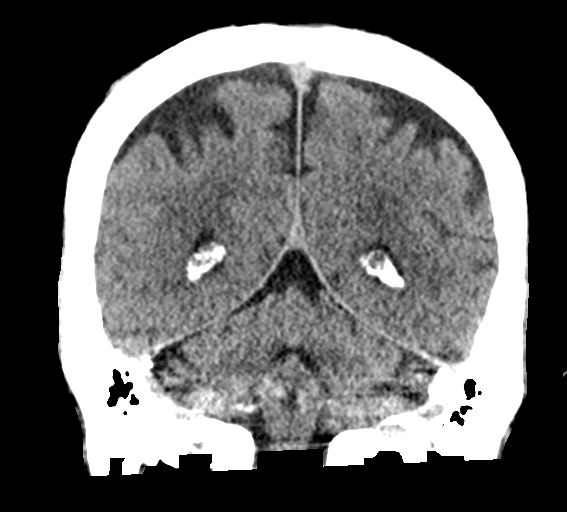
[im 31/70  brain]
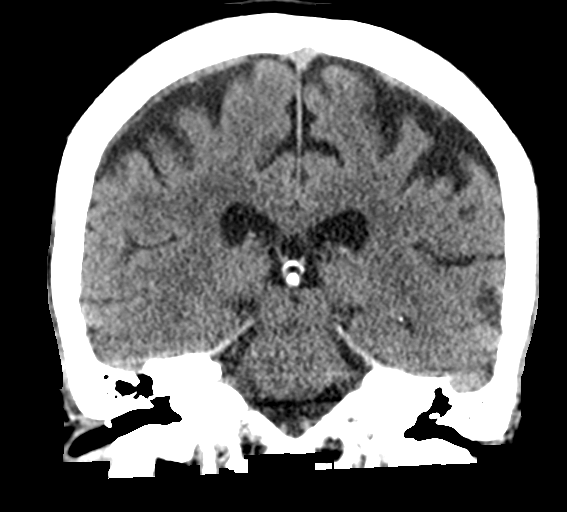
[im 39/70  brain]
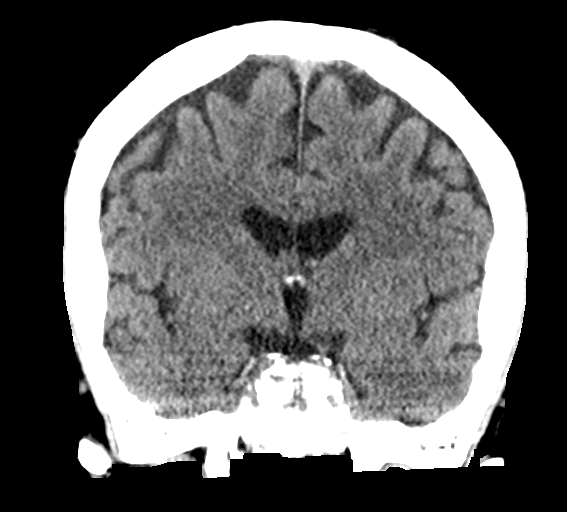

[Series 5: sagittal soft tissue · sagittal · 0.33mm/px · 3 of 63 slices shown]
[im 21/63  brain]
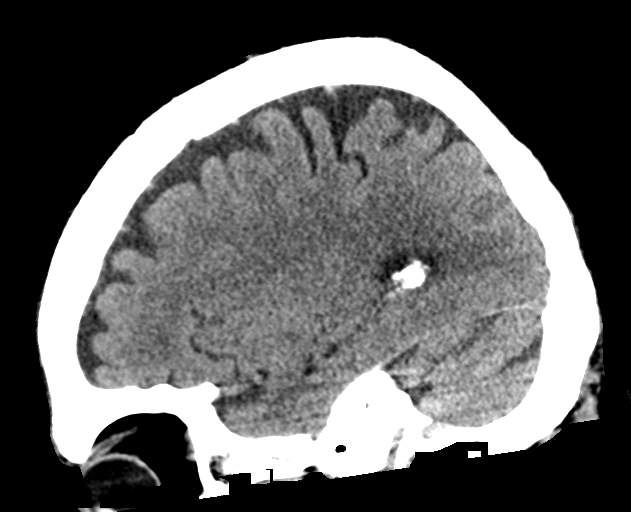
[im 32/63  brain]
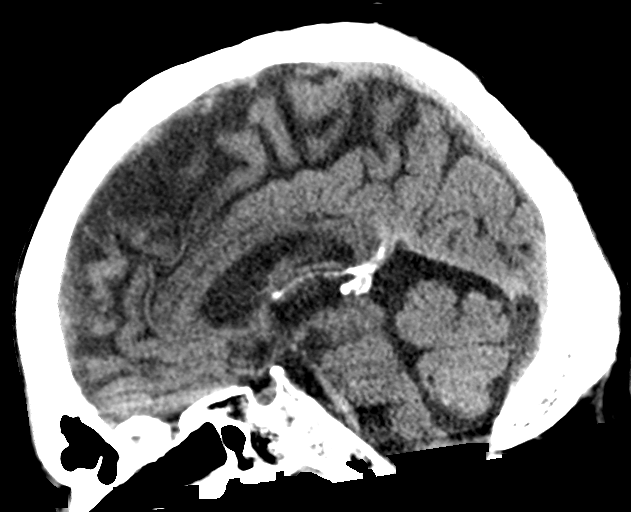
[im 42/63  brain]
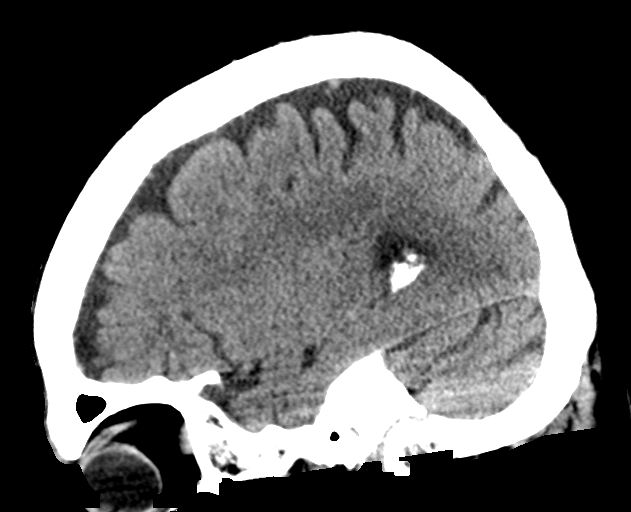

[15 of 47 positions shown; findings below may reference images not displayed]

FINDINGS: Brain: Normal anatomic configuration. There is moderate parenchymal
volume loss, more than would be typically expected for a patient of
this age, particularly involving the cerebellum. No abnormal intra
or extra-axial mass lesion or fluid collection. No abnormal mass
effect or midline shift. No evidence of acute intracranial
hemorrhage or infarct. Ventricular size is normal. Cerebellum
unremarkable.

Vascular: There is advanced vascular calcifications involving the
carotid siphons and terminal vertebral arteries, more severe than
would be typically expected in a patient of this age.

Skull: Intact

Sinuses/Orbits: Paranasal sinuses are clear. Orbits are
unremarkable.

Other: Mastoid air cells and middle ear cavities are clear.
IMPRESSION: No evidence of acute intracranial injury or calvarial fracture.

Advanced intracranial vascular calcifications. Clinical correlation
for vascular risk factors and aggressive management is recommended.

Moderate parenchymal atrophy, more than would be typically expected
for a patient of this age.

## 2021-12-17 IMAGING — DX DG ABD PORTABLE 1V
1 series · 1 of 1 positions shown · non-contrast
Comparison: None.

CLINICAL DATA: Orogastric tube

EXAM:
PORTABLE ABDOMEN - 1 VIEW

[abdomen supine]
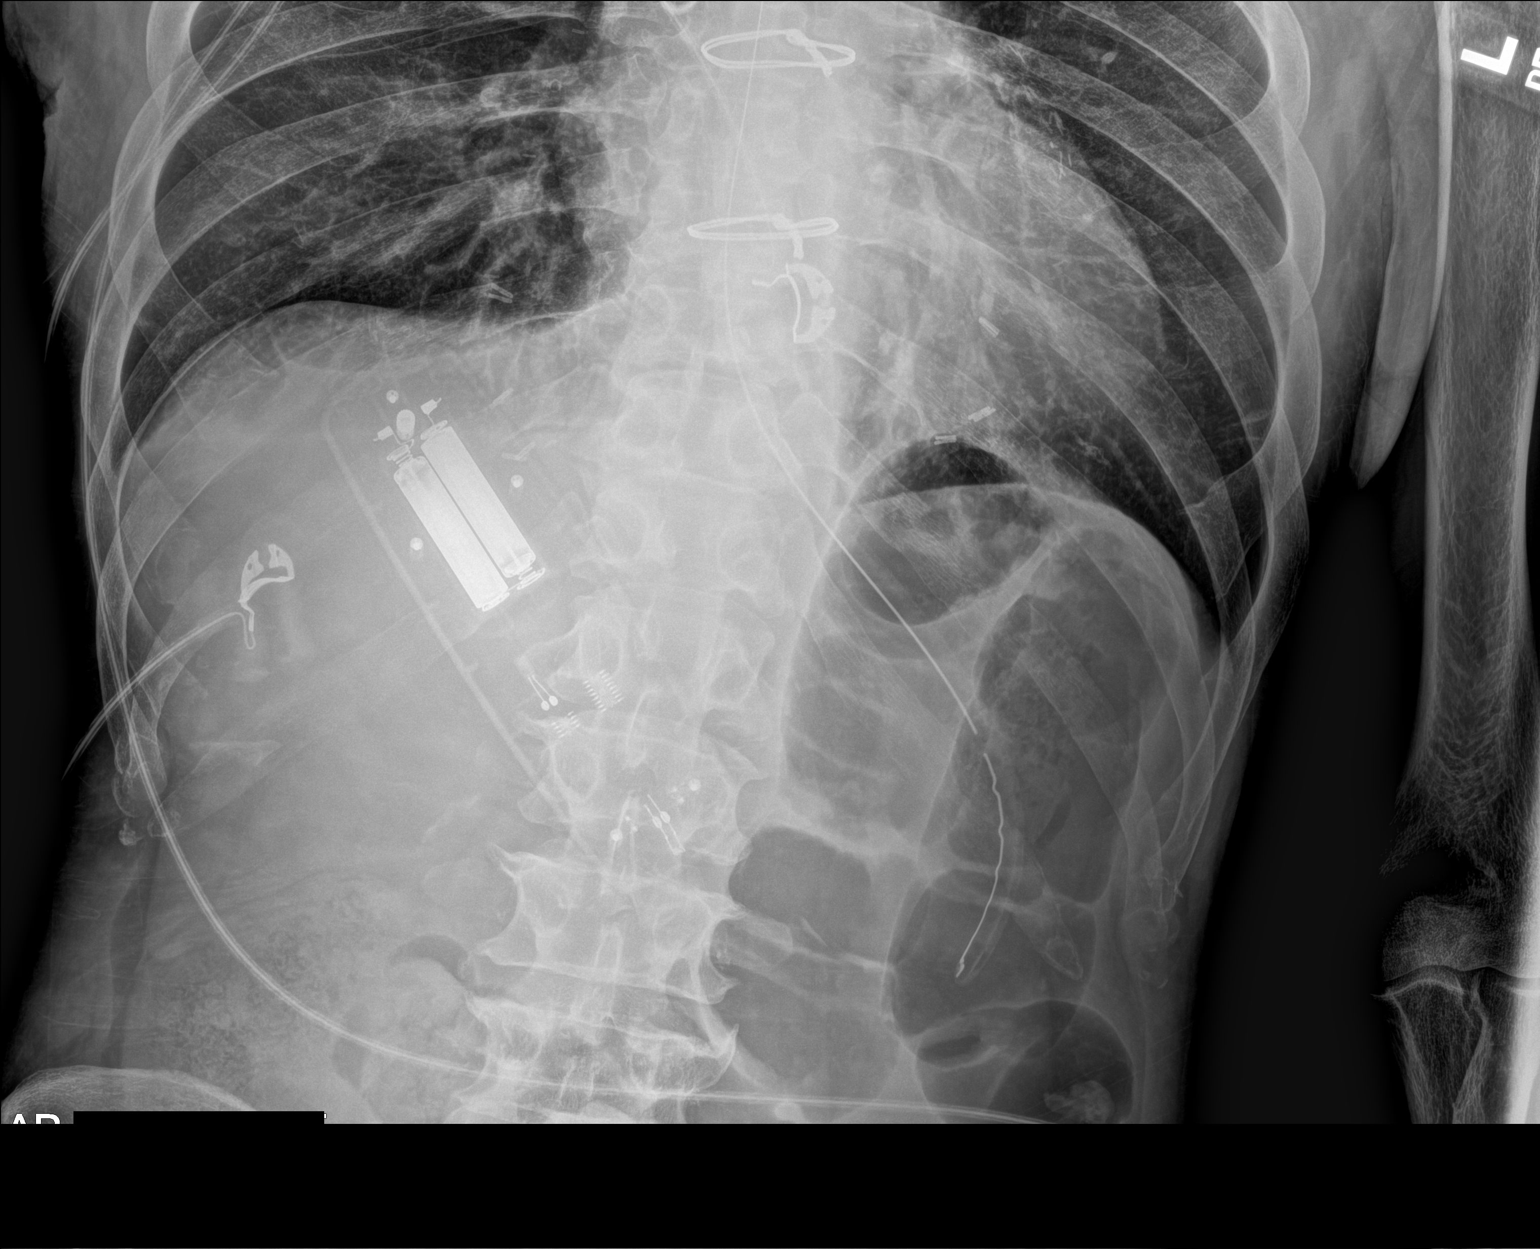

[1 of 1 positions shown; findings below may reference images not displayed]

FINDINGS: Esophagogastric tube with tip and side port below the diaphragm. The
transverse and descending colon are gas-filled although not overly
distended with scattered stool. Electronic device projects over the
central abdomen.
IMPRESSION: Esophagogastric tube with tip and side port below the diaphragm.

## 2022-07-06 ENCOUNTER — Emergency Department: Payer: Medicaid Other

## 2022-07-06 ENCOUNTER — Emergency Department
Admission: EM | Admit: 2022-07-06 | Discharge: 2022-07-06 | Disposition: A | Discharge status: Home / Self Care | Payer: Medicaid Other | Attending: Emergency Medicine | Admitting: Emergency Medicine

## 2022-07-06 ENCOUNTER — Other Ambulatory Visit: Payer: Self-pay

## 2022-07-06 DIAGNOSIS — I10 Essential (primary) hypertension: Secondary | ICD-10-CM | POA: Insufficient documentation

## 2022-07-06 DIAGNOSIS — F10920 Alcohol use, unspecified with intoxication, uncomplicated: Secondary | ICD-10-CM

## 2022-07-06 DIAGNOSIS — R569 Unspecified convulsions: Secondary | ICD-10-CM | POA: Insufficient documentation

## 2022-07-06 DIAGNOSIS — F10129 Alcohol abuse with intoxication, unspecified: Secondary | ICD-10-CM | POA: Insufficient documentation

## 2022-07-06 LAB — LACTIC ACID, PLASMA: Lactic Acid, Venous: 2 mmol/L (ref 0.5–1.9)

## 2022-07-06 LAB — CBC WITH DIFFERENTIAL/PLATELET
Abs Immature Granulocytes: 0.03 10*3/uL (ref 0.00–0.07)
Basophils Absolute: 0.1 10*3/uL (ref 0.0–0.1)
Basophils Relative: 1 %
Eosinophils Absolute: 0.2 10*3/uL (ref 0.0–0.5)
Eosinophils Relative: 2 %
HCT: 38.6 % — ABNORMAL LOW (ref 39.0–52.0)
Hemoglobin: 12.4 g/dL — ABNORMAL LOW (ref 13.0–17.0)
Immature Granulocytes: 0 %
Lymphocytes Relative: 32 %
Lymphs Abs: 2.5 10*3/uL (ref 0.7–4.0)
MCH: 27.1 pg (ref 26.0–34.0)
MCHC: 32.1 g/dL (ref 30.0–36.0)
MCV: 84.3 fL (ref 80.0–100.0)
Monocytes Absolute: 0.7 10*3/uL (ref 0.1–1.0)
Monocytes Relative: 9 %
Neutro Abs: 4.3 10*3/uL (ref 1.7–7.7)
Neutrophils Relative %: 56 %
Platelets: 249 10*3/uL (ref 150–400)
RBC: 4.58 MIL/uL (ref 4.22–5.81)
RDW: 16.2 % — ABNORMAL HIGH (ref 11.5–15.5)
WBC: 7.8 10*3/uL (ref 4.0–10.5)
nRBC: 0 % (ref 0.0–0.2)

## 2022-07-06 LAB — URINE DRUG SCREEN, QUALITATIVE (ARMC ONLY)
Amphetamines, Ur Screen: NOT DETECTED
Barbiturates, Ur Screen: NOT DETECTED
Benzodiazepine, Ur Scrn: NOT DETECTED
Cannabinoid 50 Ng, Ur ~~LOC~~: NOT DETECTED
Cocaine Metabolite,Ur ~~LOC~~: NOT DETECTED
MDMA (Ecstasy)Ur Screen: NOT DETECTED
Methadone Scn, Ur: NOT DETECTED
Opiate, Ur Screen: NOT DETECTED
Phencyclidine (PCP) Ur S: NOT DETECTED
Tricyclic, Ur Screen: NOT DETECTED

## 2022-07-06 LAB — BASIC METABOLIC PANEL
Anion gap: 11 (ref 5–15)
BUN: 8 mg/dL (ref 6–20)
CO2: 25 mmol/L (ref 22–32)
Calcium: 8.6 mg/dL — ABNORMAL LOW (ref 8.9–10.3)
Chloride: 101 mmol/L (ref 98–111)
Creatinine, Ser: 0.92 mg/dL (ref 0.61–1.24)
GFR, Estimated: >60 mL/min (ref >60)
Glucose, Bld: 105 mg/dL — ABNORMAL HIGH (ref 70–99)
Potassium: 3.3 mmol/L — ABNORMAL LOW (ref 3.5–5.1)
Sodium: 137 mmol/L (ref 135–145)

## 2022-07-06 LAB — ETHANOL: Alcohol, Ethyl (B): 187 mg/dL — ABNORMAL HIGH (ref <10)

## 2022-07-06 MED ORDER — ACETAMINOPHEN 325 MG PO TABS: 650.0000 mg | ORAL_TABLET | Freq: Once | ORAL | Status: DC

## 2022-07-06 MED ORDER — SODIUM CHLORIDE 0.9 % IV BOLUS
500.0000 mL | Freq: Once | INTRAVENOUS | Status: AC
  Administered 2022-07-06: 500 mL via INTRAVENOUS

## 2022-07-06 NOTE — Discharge Instructions (Addendum)
Your CT scan does not show any signs of a traumatic injury although you may have had a concussion.  We have given you referral to see a neurologist.  Make sure to drink plenty of fluids.  Return to the ER for new, worsening, or recurrent episodes of passing out, any convulsions or seizure-like episodes, severe headache, vomiting vision changes, weakness or numbness, or any other new or worsening symptoms that concern you.

## 2022-07-06 NOTE — ED Notes (Signed)
Patient discharged at this time. Ambulated to lobby with independent and steady gait. Breathing unlabored speaking in full sentences. Verbalized understanding of all discharge, follow up, and medication teaching. Discharged homed with all belongings.   

## 2022-07-06 NOTE — ED Provider Notes (Signed)
[facility] Provider Note    Event Date/Time   First MD Initiated Contact with Patient 07/06/22 2103     (approximate)   History   Seizures and Head Injury   HPI  Jason Atkins is a 51 y.o. male with history of hypertension, MI, alcohol use, and bipolar disorder who presents with a possible seizure-like episode.  The patient states that he drank 224 ounce beers this evening which is not an unusual amount for him although he does not drink every day and does not get withdrawal.  He states he was going to get some chairs with the people he was hanging out with and then passed out.  EMS states that the bystanders noted shaking or seizure-like activity.  The patient has a history of 1 prior seizure which may have been alcohol related although he does not have a diagnosed seizure disorder.  The patient reports a headache but denies any other pain or other acute symptoms.     Physical Exam   Triage Vital Signs: ED Triage Vitals  Enc Vitals Group     BP 07/06/22 2100 107/67     Pulse Rate 07/06/22 2100 92     Resp 07/06/22 2100 (!) 24     Temp 07/06/22 2100 98.2 F (36.8 C)     Temp Source 07/06/22 2100 Oral     SpO2 07/06/22 2100 99 %     Weight 07/06/22 2103 175 lb (79.4 kg)     Height 07/06/22 2103 5\' 11"  (1.803 m)     Head Circumference --      Peak Flow --      Pain Score 07/06/22 2100 0     Pain Loc --      Pain Edu? --      Excl. in GC? --     Most recent vital signs: Vitals:   07/06/22 2100 07/06/22 2130  BP: 107/67 108/84  Pulse: 92 88  Resp: (!) 24 (!) 27  Temp: 98.2 F (36.8 C)   SpO2: 99% 97%     General: Alert and oriented, comfortable appearing. CV:  Good peripheral perfusion.  Resp:  Normal effort.  Abd:  No distention.  Other:  EOMI.  PERRLA.  Motor intact in all extremities.  Normal coordination with no ataxia on finger-to-nose.  No tremor or asterixis.  No tongue fasciculation.   ED Results / Procedures / Treatments    Labs (all labs ordered are listed, but only abnormal results are displayed) Labs Reviewed  LACTIC ACID, PLASMA - Abnormal; Notable for the following components:      Result Value   Lactic Acid, Venous 2.0 (*)    All other components within normal limits  ETHANOL - Abnormal; Notable for the following components:   Alcohol, Ethyl (B) 187 (*)    All other components within normal limits  BASIC METABOLIC PANEL - Abnormal; Notable for the following components:   Potassium 3.3 (*)    Glucose, Bld 105 (*)    Calcium 8.6 (*)    All other components within normal limits  CBC WITH DIFFERENTIAL/PLATELET - Abnormal; Notable for the following components:   Hemoglobin 12.4 (*)    HCT 38.6 (*)    RDW 16.2 (*)    All other components within normal limits  URINE DRUG SCREEN, QUALITATIVE ([facility] ONLY)  LACTIC ACID, PLASMA     EKG  ED ECG REPORT I, 07/08/22, the attending physician, personally viewed and interpreted this ECG.  Date:  07/06/2022 EKG Time: 2103 Rate: 87 Rhythm: normal sinus rhythm QRS Axis: normal Intervals: normal ST/T Wave abnormalities: normal Narrative Interpretation: no evidence of acute ischemia    RADIOLOGY  CT head: I independently viewed and interpreted the images; there is no ICH or other acute traumatic finding.  Radiology report indicates small frontal scalp hematoma but no other acute abnormality.  PROCEDURES:  Critical Care performed: No  Procedures   MEDICATIONS ORDERED IN ED: Medications  acetaminophen (TYLENOL) tablet 650 mg (has no administration in time range)  sodium chloride 0.9 % bolus 500 mL (500 mLs Intravenous New Bag/Given 07/06/22 2111)     IMPRESSION / MDM / ASSESSMENT AND PLAN / ED COURSE  I reviewed the triage vital signs and the nursing notes.  51 year old male with PMH as noted above presents with possible seizure versus syncope.  Currently the patient's vital signs are normal.  Neurologic exam is nonfocal, and he  has no physical exam signs of withdrawal.  Differential diagnosis includes, but is not limited to, epileptic seizure, pseudoseizure, syncope with convulsions, alcohol intoxication.  We will obtain CT head, lab work-up, give fluids, and reassess.  Patient's presentation is most consistent with acute presentation with potential threat to life or bodily function.  The patient is on the cardiac monitor to evaluate for evidence of arrhythmia and/or significant heart rate changes.  ----------------------------------------- 11:12 PM on 07/06/2022 -----------------------------------------  CT head shows scalp hematoma but no other acute abnormality.  Lab work-up is overall reassuring.  The alcohol level is elevated but the electrolytes are normal.  There is no leukocytosis.  UDS is negative.  Lactate is 2.0 which is borderline elevated but this is most consistent with dehydration.  The patient has now received fluids.  Given that it is only just beyond the reference range there is no indication for a repeat.  This lactate level is also not consistent with a true epileptic seizure, so I suspect the patient likely had syncope with convulsions.  There were a few low blood pressure readings although these were while the patient was moving around.  When I have him lie still the blood pressure is normal.  There is still no evidence of alcohol withdrawal.  The patient feels well except for a mild headache.  At this time there is no indication for further ED work-up or inpatient admission.  I counseled him on the results of the work-up.  He feels well and would like to go home.  Return precautions given, and he expresses understanding.  I have given him neurology referral.   FINAL CLINICAL IMPRESSION(S) / ED DIAGNOSES   Final diagnoses:  Seizure-like activity (HCC)  Alcoholic intoxication without complication (HCC)     Rx / DC Orders   ED Discharge Orders     None        Note:  This document  was prepared using Dragon voice recognition software and may include unintentional dictation errors.    Dionne Bucy, MD 07/06/22 2315

## 2022-07-06 NOTE — ED Notes (Signed)
Seizure precautions in place. Pads in place on rails, suction and oxygen set up at bedside. Cardiac monitor in place. Bed low and locked with side rails raised x2. Call bell in reach. Pt breathing unlabored speaking in full sentences with symmetric chest rise and fall. Pt alert and oriented following commands.

## 2022-07-06 NOTE — ED Triage Notes (Signed)
Suspected seizure at home, unwitnessed. Hx of same r/t withrawl. Hx of HTN and stroke and noncompliant with home meds. Pt reports daily drinker and no etoh since yesterday when pt had 1 drink. Pt arrives alert and following commands. Pt hit head during episode and presents with hematoma to L forehead. EMS reports pt confused on their arrival.   Bgl 130 with EMS

## 2024-08-14 ENCOUNTER — Emergency Department (HOSPITAL_COMMUNITY)
Admission: EM | Admit: 2024-08-14 | Discharge: 2024-08-14 | Disposition: A | Discharge status: Home / Self Care | Payer: MEDICAID | Attending: Emergency Medicine | Admitting: Emergency Medicine

## 2024-08-14 ENCOUNTER — Other Ambulatory Visit: Payer: Self-pay

## 2024-08-14 DIAGNOSIS — L03113 Cellulitis of right upper limb: Secondary | ICD-10-CM | POA: Diagnosis not present

## 2024-08-14 DIAGNOSIS — M79641 Pain in right hand: Secondary | ICD-10-CM | POA: Diagnosis present

## 2024-08-14 MED ORDER — SULFAMETHOXAZOLE-TRIMETHOPRIM 800-160 MG PO TABS
1.0000 | ORAL_TABLET | Freq: Once | ORAL | Status: AC
  Administered 2024-08-14: 1 via ORAL
  Filled 2024-08-14: qty 1

## 2024-08-14 MED ORDER — SULFAMETHOXAZOLE-TRIMETHOPRIM 800-160 MG PO TABS
1.0000 | ORAL_TABLET | Freq: Two times a day (BID) | ORAL | 0 refills | Status: AC
Start: 2024-08-14 — End: 2024-08-21

## 2024-08-14 NOTE — ED Triage Notes (Signed)
 PT ambulatory to triage with complaints of a possible bite to the RIGHT hand. PT noted a bump and itching yesterday, then the hand began to turn red and swell. Pt did not see any insect.

## 2024-08-14 NOTE — Discharge Instructions (Addendum)
 Take antibiotic as directed.  For any rapid worsening of symptoms return to the emergency department such as worsening redness, swelling, pain, or fever.  Please follow-up with the hand specialist.  It may take up to 2 days to notice significant improvement.  Return for emergent symptoms.

## 2024-08-14 NOTE — ED Provider Notes (Signed)
 [name] EMERGENC[facility] DEPARTMENT AT [facility] Provider Note   CSN: [id number] Arrival date & time: 08/14/24  9191     Patient presents with: Hand Pain   Jason Atkins is a 53 y.o. male.   53 year old male presents today for concern of redness and swelling to his right hand.  This started yesterday.  He believes it might have been a spider bite but he did not see the spider.  Denies any other insect either.  He has an area of wound and he states it leaks clear fluid.  Denies any fever.  He is ambidextrous.  He states he does use his right hand for writing mostly.  The history is provided by the patient. No language interpreter was used.       Prior to Admission medications   Medication Sig Start Date End Date Taking? Authorizing Provider  sulfamethoxazole -trimethoprim  (BACTRIM  DS) 800-160 MG tablet Take 1 tablet by mouth 2 (two) times daily for 7 days. 08/14/24 08/21/24 Yes Gerardo Caiazzo, PA-C  folic acid  (FOLVITE ) 1 MG tablet Take 1 tablet (1 mg total) by mouth daily. 01/27/21   Patel, Sona, MD  Multiple Vitamin (MULTIVITAMIN WITH MINERALS) TABS tablet Take 1 tablet by mouth daily. 01/27/21   Patel, Sona, MD  OLANZapine  zydis (ZYPREXA ) 20 MG disintegrating tablet Take 1 tablet (20 mg total) by mouth at bedtime. 06/22/20 07/22/20  Trudy Anthony HERO, MD  thiamine  100 MG tablet Take 0.5 tablets (50 mg total) by mouth daily. 01/26/21   Patel, Sona, MD    Allergies: Almond oil, Cashew nut oil, Ibuprofen, and Benadryl  [diphenhydramine ]    Review of Systems  Constitutional:  Negative for chills and fever.  Skin:  Positive for wound.  All other systems reviewed and are negative.   Updated Vital Signs BP (!) 159/106 (BP Location: Right Arm)   Pulse 91   Temp 98.2 F (36.8 C) (Oral)   Resp 16   SpO2 99%   Physical Exam Vitals and nursing note reviewed.  Constitutional:      General: He is not in acute distress.    Appearance: Normal appearance. He is not ill-appearing.  HENT:      Head: Normocephalic and atraumatic.     Nose: Nose normal.  Eyes:     Conjunctiva/sclera: Conjunctivae normal.  Cardiovascular:     Rate and Rhythm: Normal rate and regular rhythm.  Pulmonary:     Effort: Pulmonary effort is normal. No respiratory distress.  Musculoskeletal:        General: Swelling present. No deformity.     Comments: Right hand swelling and redness.  There is a wound noted to the posterior aspect of the hand.  See attached image for description.  Skin:    Findings: No rash.  Neurological:     Mental Status: He is alert.     (all labs ordered are listed, but only abnormal results are displayed) Labs Reviewed - No data to display  EKG: None  Radiology: No results found.   Procedures   Medications Ordered in the ED  sulfamethoxazole -trimethoprim  (BACTRIM  DS) 800-160 MG per tablet 1 tablet (has no administration in time range)                                    Medical Decision Making Risk Prescription drug management.   53 year old male presents today for concern of cellulitis to the right hand.  Started yesterday.  See attached image for description.  I spoke to Ozell Purchase with orthopedic group who recommends since he has not been on any antibiotics yet to trial outpatient antibiotics and have him follow-up with hand clinic.  Strict return precautions given to patient. I offered patient an initial dose of IV antibiotic in the emergency department prior to starting him on p.o. antibiotics however he states that he would like to be discharged and would like to start p.o. antibiotics and will take the first dose in the emergency department.  He is accompanied by his friend Jason Atkins.  They both seem appreciative of the care provided.  He voices understanding of the strict return precautions discussed. Discharged in stable condition.   Final diagnoses:  Cellulitis of right upper extremity    ED Discharge Orders          Ordered     sulfamethoxazole -trimethoprim  (BACTRIM  DS) 800-160 MG tablet  2 times daily        08/14/24 1238               Hildegard Loge, PA-C 08/14/24 1247    Towana Ozell BROCKS, MD 08/14/24 1740
